# Patient Record
Sex: Male | Born: 1950
Health system: Southern US, Community
[De-identification: ages and names within clinical notes are randomized; demographics above are authoritative.]

## PROBLEM LIST (undated history)

## (undated) DIAGNOSIS — I714 Abdominal aortic aneurysm, without rupture, unspecified: Secondary | ICD-10-CM

## (undated) DIAGNOSIS — N3289 Other specified disorders of bladder: Secondary | ICD-10-CM

## (undated) DIAGNOSIS — J449 Chronic obstructive pulmonary disease, unspecified: Secondary | ICD-10-CM

## (undated) DIAGNOSIS — K219 Gastro-esophageal reflux disease without esophagitis: Secondary | ICD-10-CM

## (undated) DIAGNOSIS — R918 Other nonspecific abnormal finding of lung field: Secondary | ICD-10-CM

## (undated) DIAGNOSIS — E119 Type 2 diabetes mellitus without complications: Secondary | ICD-10-CM

## (undated) DIAGNOSIS — M1990 Unspecified osteoarthritis, unspecified site: Secondary | ICD-10-CM

## (undated) DIAGNOSIS — F419 Anxiety disorder, unspecified: Secondary | ICD-10-CM

## (undated) DIAGNOSIS — R3915 Urgency of urination: Secondary | ICD-10-CM

## (undated) DIAGNOSIS — D649 Anemia, unspecified: Secondary | ICD-10-CM

## (undated) HISTORY — PX: KNEE SURGERY: SHX244

## (undated) HISTORY — DX: Type 2 diabetes mellitus without complications: E11.9

## (undated) HISTORY — DX: Unspecified osteoarthritis, unspecified site: M19.90

## (undated) HISTORY — PX: CHOLECYSTECTOMY: SHX55

## (undated) HISTORY — PX: KNEE ARTHROSCOPY W/ MENISCAL REPAIR: SHX1877

## (undated) HISTORY — PX: KNEE ARTHROPLASTY: SHX992

---

## 1985-05-10 HISTORY — PX: CHOLECYSTECTOMY OPEN: SUR202

## 2000-08-22 ENCOUNTER — Encounter: Payer: Self-pay | Admitting: Emergency Medicine

## 2000-08-22 ENCOUNTER — Emergency Department (HOSPITAL_COMMUNITY): Admission: EM | Admit: 2000-08-22 | Discharge: 2000-08-22 | Payer: Self-pay | Admitting: Emergency Medicine

## 2001-12-04 ENCOUNTER — Ambulatory Visit (HOSPITAL_COMMUNITY): Admission: RE | Admit: 2001-12-04 | Discharge: 2001-12-04 | Payer: Self-pay | Admitting: *Deleted

## 2005-05-10 HISTORY — PX: TRIGGER FINGER RELEASE: SHX641

## 2006-05-10 HISTORY — PX: ELBOW SURGERY: SHX618

## 2011-04-15 ENCOUNTER — Ambulatory Visit
Admission: RE | Admit: 2011-04-15 | Discharge: 2011-04-15 | Disposition: A | Discharge status: Home / Self Care | Payer: BC Managed Care – PPO | Source: Ambulatory Visit | Attending: Family Medicine | Admitting: Family Medicine

## 2011-04-15 ENCOUNTER — Other Ambulatory Visit: Payer: Self-pay | Admitting: Family Medicine

## 2011-04-15 DIAGNOSIS — R109 Unspecified abdominal pain: Secondary | ICD-10-CM

## 2011-04-15 DIAGNOSIS — N200 Calculus of kidney: Secondary | ICD-10-CM

## 2013-04-26 ENCOUNTER — Other Ambulatory Visit: Payer: Self-pay | Admitting: Family Medicine

## 2013-04-26 DIAGNOSIS — F17200 Nicotine dependence, unspecified, uncomplicated: Secondary | ICD-10-CM

## 2013-05-18 ENCOUNTER — Inpatient Hospital Stay: Admission: RE | Admit: 2013-05-18 | Payer: BC Managed Care – PPO | Source: Ambulatory Visit

## 2015-04-19 DIAGNOSIS — J209 Acute bronchitis, unspecified: Secondary | ICD-10-CM | POA: Diagnosis not present

## 2015-06-04 DIAGNOSIS — R413 Other amnesia: Secondary | ICD-10-CM | POA: Diagnosis not present

## 2015-06-04 DIAGNOSIS — F321 Major depressive disorder, single episode, moderate: Secondary | ICD-10-CM | POA: Diagnosis not present

## 2015-06-04 DIAGNOSIS — Z125 Encounter for screening for malignant neoplasm of prostate: Secondary | ICD-10-CM | POA: Diagnosis not present

## 2015-06-04 DIAGNOSIS — Z23 Encounter for immunization: Secondary | ICD-10-CM | POA: Diagnosis not present

## 2015-06-04 DIAGNOSIS — Z136 Encounter for screening for cardiovascular disorders: Secondary | ICD-10-CM | POA: Diagnosis not present

## 2015-06-04 DIAGNOSIS — R252 Cramp and spasm: Secondary | ICD-10-CM | POA: Diagnosis not present

## 2015-06-04 DIAGNOSIS — R0683 Snoring: Secondary | ICD-10-CM | POA: Diagnosis not present

## 2015-06-04 DIAGNOSIS — Z0000 Encounter for general adult medical examination without abnormal findings: Secondary | ICD-10-CM | POA: Diagnosis not present

## 2015-06-10 ENCOUNTER — Other Ambulatory Visit: Payer: Self-pay | Admitting: Family Medicine

## 2015-06-10 DIAGNOSIS — Z139 Encounter for screening, unspecified: Secondary | ICD-10-CM

## 2015-06-10 DIAGNOSIS — H2513 Age-related nuclear cataract, bilateral: Secondary | ICD-10-CM | POA: Diagnosis not present

## 2015-06-10 DIAGNOSIS — D3132 Benign neoplasm of left choroid: Secondary | ICD-10-CM | POA: Diagnosis not present

## 2015-06-19 ENCOUNTER — Ambulatory Visit
Admission: RE | Admit: 2015-06-19 | Discharge: 2015-06-19 | Disposition: A | Discharge status: Home / Self Care | Payer: Medicare Other | Source: Ambulatory Visit | Attending: Family Medicine | Admitting: Family Medicine

## 2015-06-19 DIAGNOSIS — Z139 Encounter for screening, unspecified: Secondary | ICD-10-CM

## 2015-06-19 DIAGNOSIS — I77811 Abdominal aortic ectasia: Secondary | ICD-10-CM | POA: Diagnosis not present

## 2015-06-19 DIAGNOSIS — Z136 Encounter for screening for cardiovascular disorders: Secondary | ICD-10-CM | POA: Diagnosis not present

## 2015-06-19 DIAGNOSIS — Z87891 Personal history of nicotine dependence: Secondary | ICD-10-CM | POA: Diagnosis not present

## 2015-06-26 DIAGNOSIS — G4710 Hypersomnia, unspecified: Secondary | ICD-10-CM | POA: Diagnosis not present

## 2015-06-30 DIAGNOSIS — F321 Major depressive disorder, single episode, moderate: Secondary | ICD-10-CM | POA: Diagnosis not present

## 2015-06-30 DIAGNOSIS — I719 Aortic aneurysm of unspecified site, without rupture: Secondary | ICD-10-CM | POA: Diagnosis not present

## 2015-06-30 DIAGNOSIS — I700 Atherosclerosis of aorta: Secondary | ICD-10-CM | POA: Diagnosis not present

## 2015-06-30 DIAGNOSIS — R413 Other amnesia: Secondary | ICD-10-CM | POA: Diagnosis not present

## 2015-06-30 DIAGNOSIS — R0609 Other forms of dyspnea: Secondary | ICD-10-CM | POA: Diagnosis not present

## 2015-07-01 ENCOUNTER — Other Ambulatory Visit: Payer: Self-pay | Admitting: Family Medicine

## 2015-07-01 DIAGNOSIS — R413 Other amnesia: Secondary | ICD-10-CM

## 2015-07-09 ENCOUNTER — Ambulatory Visit
Admission: RE | Admit: 2015-07-09 | Discharge: 2015-07-09 | Disposition: A | Discharge status: Home / Self Care | Payer: Medicare Other | Source: Ambulatory Visit | Attending: Family Medicine | Admitting: Family Medicine

## 2015-07-09 DIAGNOSIS — R413 Other amnesia: Secondary | ICD-10-CM | POA: Diagnosis not present

## 2015-07-21 ENCOUNTER — Other Ambulatory Visit: Payer: Self-pay | Admitting: Acute Care

## 2015-07-21 DIAGNOSIS — Z87891 Personal history of nicotine dependence: Secondary | ICD-10-CM

## 2015-07-29 ENCOUNTER — Encounter: Payer: Self-pay | Admitting: Acute Care

## 2015-07-29 ENCOUNTER — Ambulatory Visit (INDEPENDENT_AMBULATORY_CARE_PROVIDER_SITE_OTHER)
Admission: RE | Admit: 2015-07-29 | Discharge: 2015-07-29 | Disposition: A | Discharge status: Home / Self Care | Payer: Medicare Other | Source: Ambulatory Visit | Attending: Acute Care | Admitting: Acute Care

## 2015-07-29 ENCOUNTER — Ambulatory Visit (INDEPENDENT_AMBULATORY_CARE_PROVIDER_SITE_OTHER): Payer: Medicare Other | Admitting: Acute Care

## 2015-07-29 DIAGNOSIS — Z87891 Personal history of nicotine dependence: Secondary | ICD-10-CM | POA: Diagnosis not present

## 2015-07-29 NOTE — Progress Notes (Signed)
Shared Decision Making Visit Lung Cancer Screening Program [phone])   Eligibility:  Age 65 y.o.  Pack Years Smoking History Calculation 52.5 pack years (# packs/per year x # years smoked)  Recent History of coughing up blood  no  Unexplained weight loss? no ( >Than 15 pounds within the last 6 months )  Prior History Lung / other cancer no (Diagnosis within the last 5 years already requiring surveillance chest CT Scans).  Smoking Status Former Smoker  Former Smokers: Years since quit: < 1 year  Quit Date: 04/2015  Visit Components:  Discussion included one or more decision making aids. yes  Discussion included risk/benefits of screening. yes  Discussion included potential follow up diagnostic testing for abnormal scans. yes  Discussion included meaning and risk of over diagnosis. yes  Discussion included meaning and risk of False Positives. yes  Discussion included meaning of total radiation exposure. yes  Counseling Included:  Importance of adherence to annual lung cancer LDCT screening. yes  Impact of comorbidities on ability to participate in the program. yes  Ability and willingness to under diagnostic treatment. yes  Smoking Cessation Counseling:  Current Smokers:   Discussed importance of smoking cessation.NA: Former smoker  Information about tobacco cessation classes and interventions provided to patient. yes  Patient provided with "ticket" for LDCT Scan. yes  Symptomatic Patient. no  Counseling:NA  Diagnosis Code: Tobacco Use Z72.0  Asymptomatic Patient yes  Counseling NA: Former smoker  Former Smokers:   Discussed the importance of maintaining cigarette abstinence. yes  Diagnosis Code: Personal History of Nicotine Dependence. [id number]  Information about tobacco cessation classes and interventions provided to patient. Yes  Patient provided with "ticket" for LDCT Scan. yes  Written Order for Lung Cancer Screening with LDCT placed in Epic.  Yes (CT Chest Lung Cancer Screening Low Dose W/O CM) [id number] Z12.2-Screening of respiratory organs Z87.891-Personal history of nicotine dependence  I spent 15 minutes of face to face time with Mr. Burggraf discussing the risks and benefits of lung cancer screening. We viewed a power point together that explained in detail the above noted topics. We took the time to pause the power point at intervals to allow for questions to be asked and answered to ensure understanding. We discussed that he had taken the single most powerful action possible to decrease his risk of developing lung cancer when he quit smoking. I counseled Mr. Forseth to remain smoke free, and to contact me if he ever had the desire to smoke again so that I can provide resources and tools to help support the effort to remain smoke free. We discussed the time and location of the scan, and that either [place] or I will call with the results within  24-48 hours of receiving them. Mr. Lucatero has my card and contact information in the event he needs to speak with me, in addition to a copy of the power point we reviewed as a resource. Both he and his wife verbalized understanding of all of the above and had no further questions upon leaving the office.    Magdalen Spatz, NP 07/29/2015

## 2015-09-29 DIAGNOSIS — E538 Deficiency of other specified B group vitamins: Secondary | ICD-10-CM | POA: Diagnosis not present

## 2015-09-29 DIAGNOSIS — I719 Aortic aneurysm of unspecified site, without rupture: Secondary | ICD-10-CM | POA: Diagnosis not present

## 2015-09-29 DIAGNOSIS — Z23 Encounter for immunization: Secondary | ICD-10-CM | POA: Diagnosis not present

## 2015-09-29 DIAGNOSIS — F321 Major depressive disorder, single episode, moderate: Secondary | ICD-10-CM | POA: Diagnosis not present

## 2015-09-29 DIAGNOSIS — I700 Atherosclerosis of aorta: Secondary | ICD-10-CM | POA: Diagnosis not present

## 2015-10-09 DIAGNOSIS — M65332 Trigger finger, left middle finger: Secondary | ICD-10-CM | POA: Diagnosis not present

## 2015-10-23 DIAGNOSIS — M65332 Trigger finger, left middle finger: Secondary | ICD-10-CM | POA: Diagnosis not present

## 2016-03-16 ENCOUNTER — Telehealth: Payer: Self-pay | Admitting: Acute Care

## 2016-03-16 DIAGNOSIS — Z87891 Personal history of nicotine dependence: Secondary | ICD-10-CM

## 2016-03-16 NOTE — Telephone Encounter (Signed)
This is documentation of the phone call made on 08/01/2015 to Mr. Chad Flores with the results of his low-dose screening CT. I explained his scan was read as a lung RADS category 2, indicating nodules that are benign in both appearance and behavior. Recommendation per radiology is for continued annual screening with low-dose chest CT without contrast in 12 months. I indicated to Chad Flores that we would order and schedule the follow-up scan for March 2018. I also explained that the scan indicated some underlying emphysema, of which she was already aware . He verbalized understanding of the above and had no further questions regarding the results of his scan. He has my contact information in the event he has questions in the future.

## 2016-06-03 DIAGNOSIS — J069 Acute upper respiratory infection, unspecified: Secondary | ICD-10-CM | POA: Diagnosis not present

## 2016-06-14 DIAGNOSIS — I700 Atherosclerosis of aorta: Secondary | ICD-10-CM | POA: Diagnosis not present

## 2016-06-14 DIAGNOSIS — Z5181 Encounter for therapeutic drug level monitoring: Secondary | ICD-10-CM | POA: Diagnosis not present

## 2016-06-14 DIAGNOSIS — Z23 Encounter for immunization: Secondary | ICD-10-CM | POA: Diagnosis not present

## 2016-06-14 DIAGNOSIS — Z125 Encounter for screening for malignant neoplasm of prostate: Secondary | ICD-10-CM | POA: Diagnosis not present

## 2016-06-14 DIAGNOSIS — F321 Major depressive disorder, single episode, moderate: Secondary | ICD-10-CM | POA: Diagnosis not present

## 2016-06-14 DIAGNOSIS — Z0000 Encounter for general adult medical examination without abnormal findings: Secondary | ICD-10-CM | POA: Diagnosis not present

## 2016-06-14 DIAGNOSIS — I719 Aortic aneurysm of unspecified site, without rupture: Secondary | ICD-10-CM | POA: Diagnosis not present

## 2016-07-21 DIAGNOSIS — H2513 Age-related nuclear cataract, bilateral: Secondary | ICD-10-CM | POA: Diagnosis not present

## 2016-07-21 DIAGNOSIS — D3132 Benign neoplasm of left choroid: Secondary | ICD-10-CM | POA: Diagnosis not present

## 2016-07-29 ENCOUNTER — Ambulatory Visit (INDEPENDENT_AMBULATORY_CARE_PROVIDER_SITE_OTHER)
Admission: RE | Admit: 2016-07-29 | Discharge: 2016-07-29 | Disposition: A | Discharge status: Home / Self Care | Payer: Medicare Other | Source: Ambulatory Visit | Attending: Acute Care | Admitting: Acute Care

## 2016-07-29 DIAGNOSIS — Z87891 Personal history of nicotine dependence: Secondary | ICD-10-CM | POA: Diagnosis not present

## 2016-08-04 ENCOUNTER — Other Ambulatory Visit: Payer: Self-pay | Admitting: Acute Care

## 2016-08-04 DIAGNOSIS — I700 Atherosclerosis of aorta: Secondary | ICD-10-CM | POA: Diagnosis not present

## 2016-08-04 DIAGNOSIS — I719 Aortic aneurysm of unspecified site, without rupture: Secondary | ICD-10-CM | POA: Diagnosis not present

## 2016-08-04 DIAGNOSIS — J439 Emphysema, unspecified: Secondary | ICD-10-CM | POA: Diagnosis not present

## 2016-08-04 DIAGNOSIS — Z87891 Personal history of nicotine dependence: Secondary | ICD-10-CM

## 2016-10-07 DIAGNOSIS — L989 Disorder of the skin and subcutaneous tissue, unspecified: Secondary | ICD-10-CM | POA: Diagnosis not present

## 2016-10-07 DIAGNOSIS — W57XXXA Bitten or stung by nonvenomous insect and other nonvenomous arthropods, initial encounter: Secondary | ICD-10-CM | POA: Diagnosis not present

## 2016-10-07 DIAGNOSIS — J439 Emphysema, unspecified: Secondary | ICD-10-CM | POA: Diagnosis not present

## 2016-11-09 IMAGING — MR MR HEAD W/O CM
10 series · 48 of 48 positions shown · non-contrast
Comparison: None.

CLINICAL DATA: Memory loss over the last 6-8 months.

EXAM:
MRI HEAD WITHOUT CONTRAST
TECHNIQUE: Multiplanar, multiecho pulse sequences of the brain and surrounding
structures were obtained without intravenous contrast.

[Series 2: t1_se_sag · sagittal · 5.0mm · 0.45mm/px · 3 of 21 slices shown]
[im 1/21]
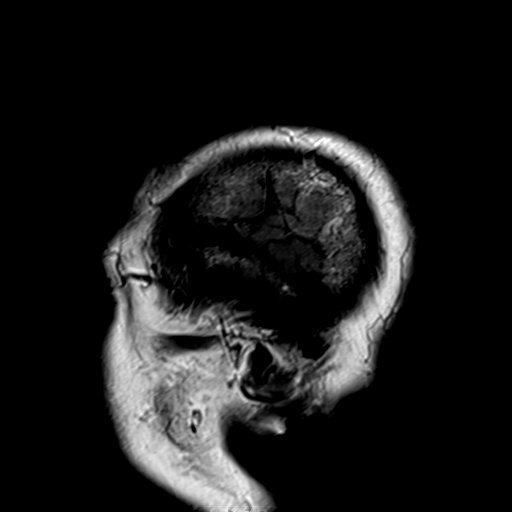
[im 11/21]
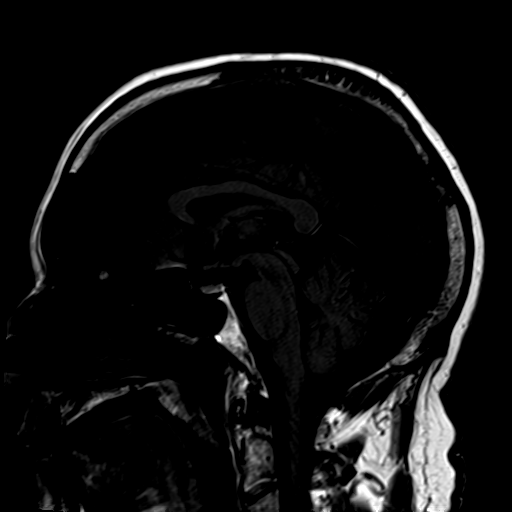
[im 21/21]
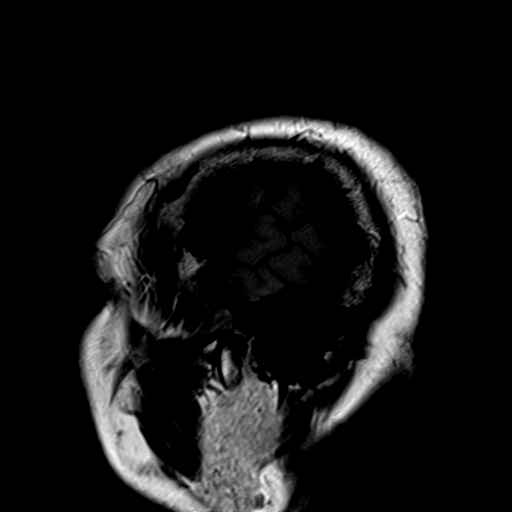

[Series 3: ep2d_diff_(id)_trace · axial · 3.0mm · 1.80mm/px · z∈[-42,+105]mm · 10 of 99 slices shown]
[im 1/99]
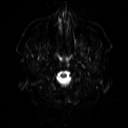
[im 11/99]
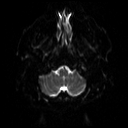
[im 22/99]
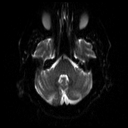
[im 33/99]
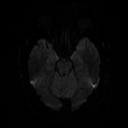
[im 44/99]
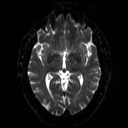
[im 55/99]
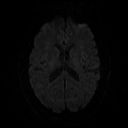
[im 66/99]
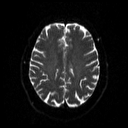
[im 77/99]
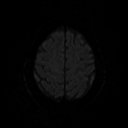
[im 88/99]
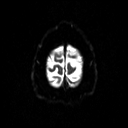
[im 99/99]
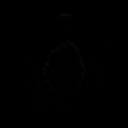

[Series 4: ep2d_diff_(id)_trace_adc · axial · 3.0mm · 1.80mm/px · z∈[-42,+105]mm · 5 of 49 slices shown]
[im 1/49]
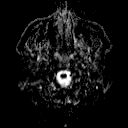
[im 13/49]
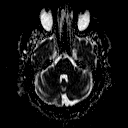
[im 25/49]
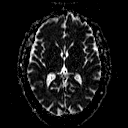
[im 37/49]
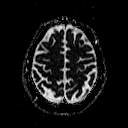
[im 49/49]
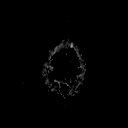

[Series 5: ep2d_diff_cor · coronal · 5.0mm · 1.77mm/px · 5 of 48 slices shown]
[im 1/48]
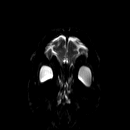
[im 12/48]
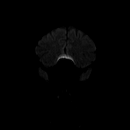
[im 24/48]
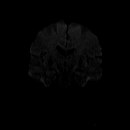
[im 36/48]
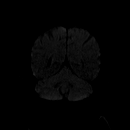
[im 48/48]
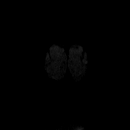

[Series 6: ep2d_diff_cor_adc · coronal · 5.0mm · 1.77mm/px · 2 of 24 slices shown]
[im 1/24]
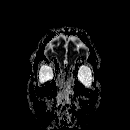
[im 24/24]
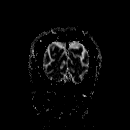

[Series 8: swi_images · axial · 2.0mm · 0.90mm/px · z∈[-51,+107]mm · 8 of 80 slices shown]
[im 1/80]
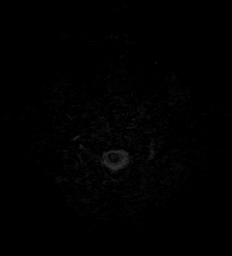
[im 12/80]
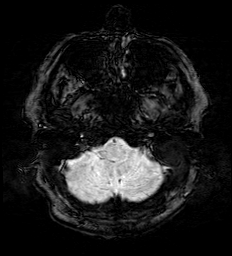
[im 23/80]
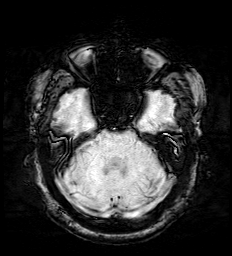
[im 34/80]
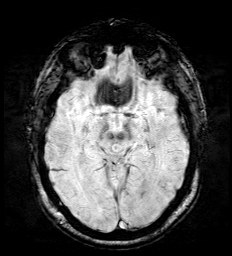
[im 46/80]
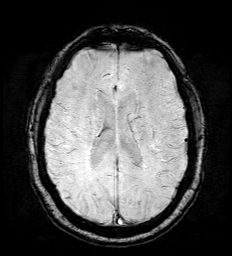
[im 57/80]
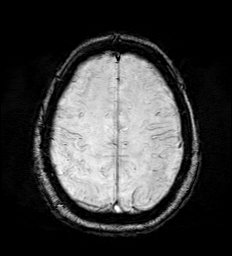
[im 68/80]
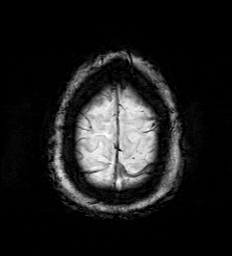
[im 80/80]
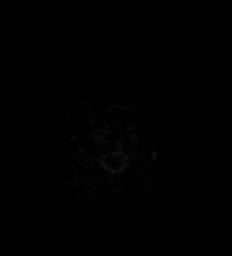

[Series 9: FLAIR · axial · 5.0mm · 0.45mm/px · z∈[-44,+100]mm · 2 of 24 slices shown]
[im 1/24]
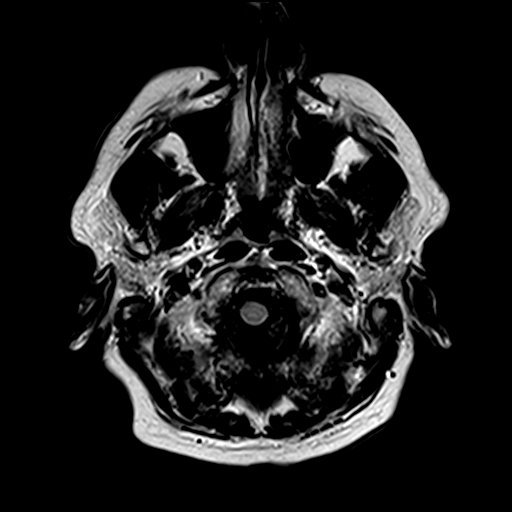
[im 24/24]
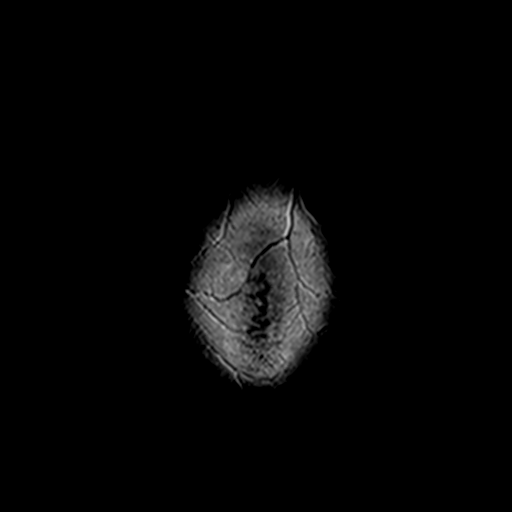

[Series 10: t2_tse_tra_512 · axial · 5.0mm · 0.60mm/px · z∈[-45,+99]mm · 2 of 24 slices shown]
[im 1/24]
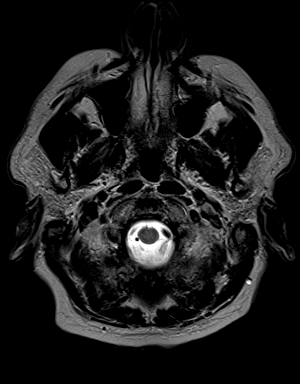
[im 24/24]
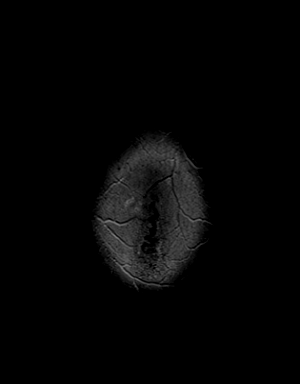

[Series 11: t1_mpr_tra · axial · 2.0mm · 0.45mm/px · z∈[-52,+106]mm · 8 of 80 slices shown]
[im 1/80]
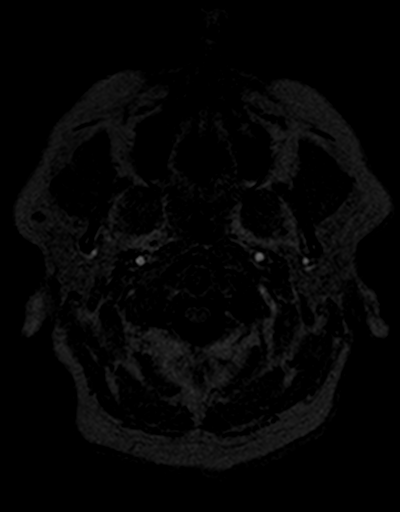
[im 12/80]
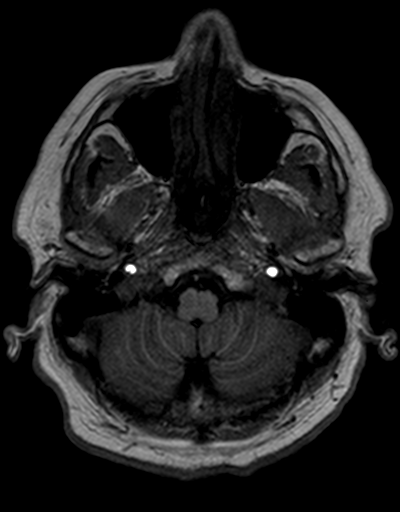
[im 23/80]
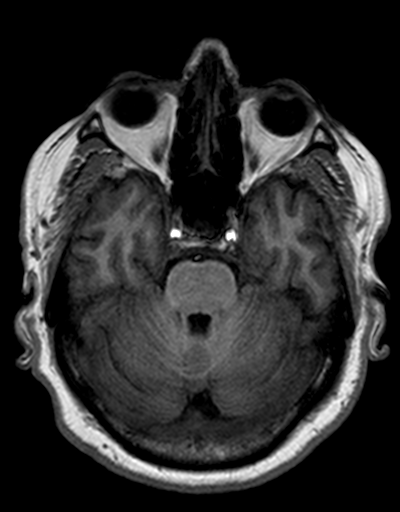
[im 34/80]
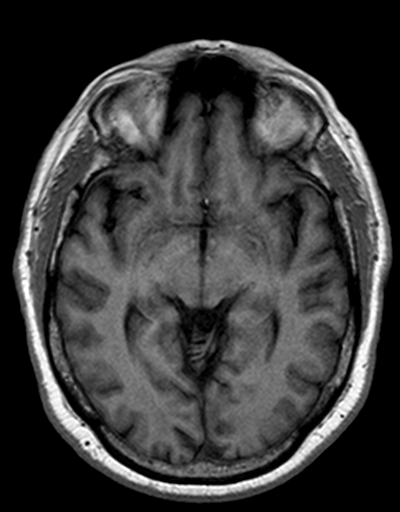
[im 46/80]
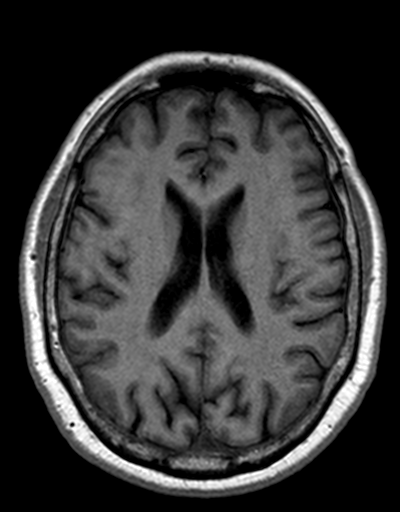
[im 57/80]
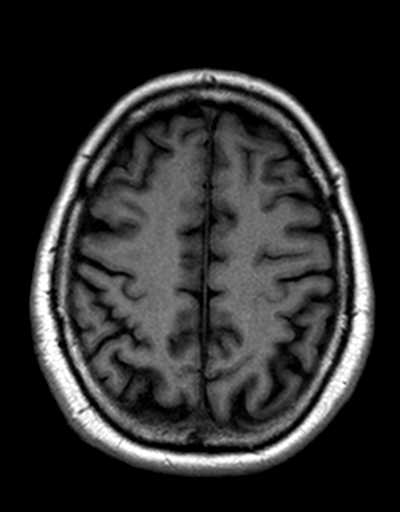
[im 68/80]
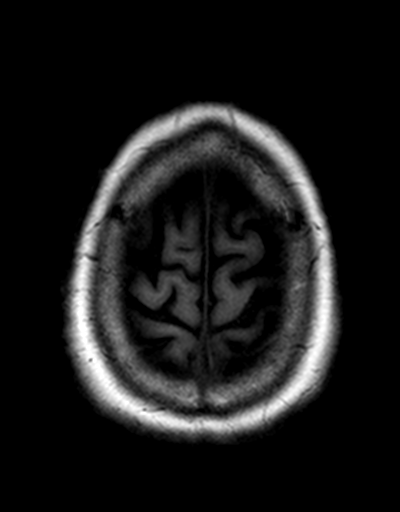
[im 80/80]
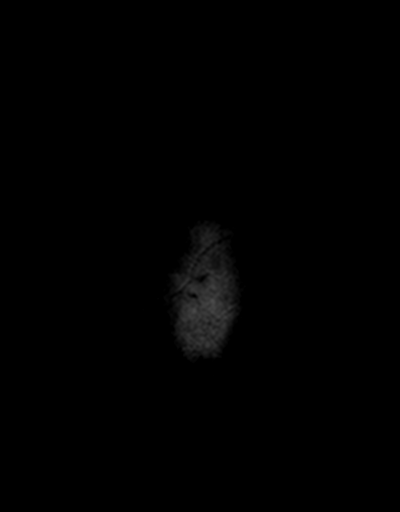

[Series 12: T2 · coronal · 5.0mm · 0.45mm/px · 3 of 25 slices shown]
[im 1/25]
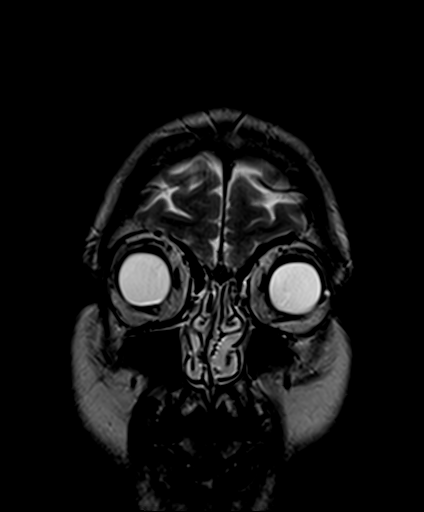
[im 13/25]
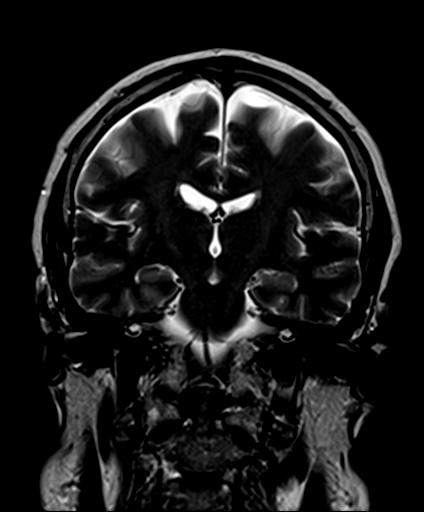
[im 25/25]
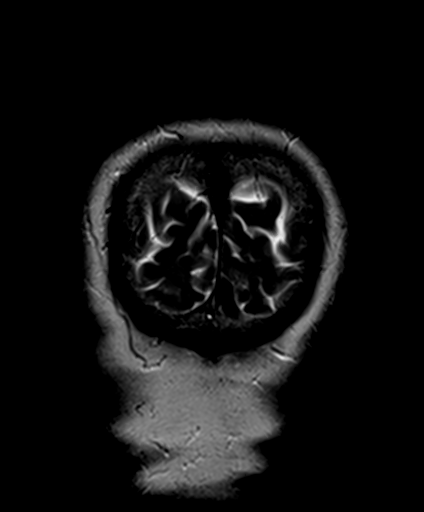

[48 of 48 positions shown; findings below may reference images not displayed]

FINDINGS: No acute infarct, hemorrhage, or mass lesion is present. The
ventricles are of normal size. No significant extraaxial fluid
collection is present.

Minimal periventricular T2 changes within normal limits for age.
There is no significant atrophy.

The internal auditory canals are within normal limits bilaterally.
Flow is present in the major intracranial arteries. The globes and
orbits are intact.

The paranasal sinuses and mastoid air cells are clear.

Skullbase is within normal limits. Midline sagittal images are
unremarkable.
IMPRESSION: 1. Normal MRI appearance of the brain for age.

## 2016-11-29 DIAGNOSIS — L280 Lichen simplex chronicus: Secondary | ICD-10-CM | POA: Diagnosis not present

## 2016-11-29 DIAGNOSIS — B079 Viral wart, unspecified: Secondary | ICD-10-CM | POA: Diagnosis not present

## 2016-11-29 DIAGNOSIS — L820 Inflamed seborrheic keratosis: Secondary | ICD-10-CM | POA: Diagnosis not present

## 2016-11-29 DIAGNOSIS — L814 Other melanin hyperpigmentation: Secondary | ICD-10-CM | POA: Diagnosis not present

## 2016-11-29 DIAGNOSIS — D485 Neoplasm of uncertain behavior of skin: Secondary | ICD-10-CM | POA: Diagnosis not present

## 2016-12-13 DIAGNOSIS — F321 Major depressive disorder, single episode, moderate: Secondary | ICD-10-CM | POA: Diagnosis not present

## 2016-12-13 DIAGNOSIS — I719 Aortic aneurysm of unspecified site, without rupture: Secondary | ICD-10-CM | POA: Diagnosis not present

## 2016-12-13 DIAGNOSIS — J439 Emphysema, unspecified: Secondary | ICD-10-CM | POA: Diagnosis not present

## 2016-12-13 DIAGNOSIS — I700 Atherosclerosis of aorta: Secondary | ICD-10-CM | POA: Diagnosis not present

## 2016-12-13 DIAGNOSIS — R7301 Impaired fasting glucose: Secondary | ICD-10-CM | POA: Diagnosis not present

## 2017-01-04 DIAGNOSIS — I959 Hypotension, unspecified: Secondary | ICD-10-CM | POA: Diagnosis not present

## 2017-01-04 DIAGNOSIS — R51 Headache: Secondary | ICD-10-CM | POA: Diagnosis not present

## 2017-01-04 DIAGNOSIS — R42 Dizziness and giddiness: Secondary | ICD-10-CM | POA: Diagnosis not present

## 2017-01-29 DIAGNOSIS — S335XXA Sprain of ligaments of lumbar spine, initial encounter: Secondary | ICD-10-CM | POA: Diagnosis not present

## 2017-01-31 ENCOUNTER — Ambulatory Visit (INDEPENDENT_AMBULATORY_CARE_PROVIDER_SITE_OTHER): Payer: Medicare Other | Admitting: Orthopaedic Surgery

## 2017-01-31 ENCOUNTER — Ambulatory Visit (INDEPENDENT_AMBULATORY_CARE_PROVIDER_SITE_OTHER): Payer: Medicare Other

## 2017-01-31 ENCOUNTER — Encounter (INDEPENDENT_AMBULATORY_CARE_PROVIDER_SITE_OTHER): Payer: Self-pay | Admitting: Orthopaedic Surgery

## 2017-01-31 ENCOUNTER — Encounter (INDEPENDENT_AMBULATORY_CARE_PROVIDER_SITE_OTHER): Payer: Self-pay

## 2017-01-31 VITALS — BP 99/59 | HR 92 | Resp 16 | Ht 70.5 in | Wt 185.0 lb

## 2017-01-31 DIAGNOSIS — M25562 Pain in left knee: Secondary | ICD-10-CM | POA: Diagnosis not present

## 2017-01-31 DIAGNOSIS — G8929 Other chronic pain: Secondary | ICD-10-CM

## 2017-01-31 NOTE — Progress Notes (Signed)
Office Visit Note   Patient: Chad Flores           Date of Birth: 1951-03-08           MRN: [id number] Visit Date: 01/31/2017              Requested by: Darcus Austin, MD [address] [place], [place] [address] PCP: Darcus Austin, MD   Assessment & Plan: Visit Diagnoses:  1. Chronic pain of left knee   end-stage osteoarthritis left knee  Plan: long discussion regarding his end-stage osteoarthritis. Mr. Beckford would like to proceedwith a left total knee replacement. I discussed this in detail including hospitalization, incision surgery rehabilitation and what he can expect postoperatively. He'll need clearance from Dr. Darcus Austin.  Follow-Up Instructions: No Follow-up on file.   Orders:  Orders Placed This Encounter  Procedures  . XR KNEE 3 VIEW LEFT   No orders of the defined types were placed in this encounter.     Procedures: No procedures performed   Clinical Data: No additional findings.   Subjective: Chief Complaint  Patient presents with  . Left Knee - Pain, Edema, Numbness    Mr. Rubey is a 66 y o that presents with chronic Left knee pain x years. Hx of 5 Left knee surgeries.   . Lower Back - Pain  Mr. Tietze has a long history of left knee problems. He's had at least 2 knee arthroscopies over many years. He's has evidence of osteoarthritis by previous films. He's had cortisone and even over-the-counter anti-inflammatory medicines. He has now reached the point where he is having  more pain to the point of compromise .he would like to discuss total knee replacement .he's not had any recent injury or trauma. Denies fever or chills. He does have chronic back pain and is being followed by Dr. Carloyn Manner.Not experiencing any numbness or tingling in his feet   HPI  Review of Systems  Constitutional: Negative for fatigue.  HENT: Negative for hearing loss.   Respiratory: Negative for apnea, chest tightness and shortness of breath.     Cardiovascular: Negative for chest pain, palpitations and leg swelling.  Gastrointestinal: Negative for blood in stool, constipation and diarrhea.  Genitourinary: Negative for difficulty urinating.  Musculoskeletal: Positive for back pain and joint swelling. Negative for arthralgias, myalgias, neck pain and neck stiffness.  Neurological: Positive for light-headedness. Negative for weakness, numbness and headaches.  Hematological: Does not bruise/bleed easily.  Psychiatric/Behavioral: Positive for sleep disturbance. The patient is not nervous/anxious.      Objective: Vital Signs: BP (!) 99/59   Pulse 92   Resp 16   Ht 5' 10.5" (1.791 m)   Wt 185 lb (83.9 kg)   BMI 26.17 kg/m   Physical Exam  Ortho Examleft knee with multiple old incisions. He had a prior open meniscectomy procedure back in the 1970s. Full extension and approximately 110 of flexion. No instability. No popliteal pain. No calf pain. Neurovascular exam appears to be intact. Does have a limp with when he weight bears weight. Straight leg raise negative bilaterally.Both hipswithout pain with range of motion. Skin intact except for the old incisions which have healed nicely. Awake alert and oriented 3. Comfortable sitting. Denies shortness of breath or chest pain. No distal edema.  Specialty Comments:  No specialty comments available.  Imaging: Xr Knee 3 View Left  Result Date: 01/31/2017 ilms of the left knee were obtained in 3 projections standing. There is significant decrease both  the medial lateral joint space with irregular joint surfaces, subchondral sclerosis and peripheral osteophytes. Alignment looks like it's reasonably normal. No ectopic calcification.Considerable osteophyte formation about the patellofemoral joint. Findings are consistent with end-stage osteoarthritisleft knee    PMFS History: There are no active problems to display for this patient.  Past Medical History:  Diagnosis Date  .  Osteoarthritis     Family History  Problem Relation Age of Onset  . Alzheimer's disease Mother   . Cancer Father     Past Surgical History:  Procedure Laterality Date  . CHOLECYSTECTOMY    . ELBOW SURGERY    . KNEE SURGERY    . TRIGGER FINGER RELEASE     Social History   Occupational History  . Not on file.   Social History Main Topics  . Smoking status: Current Every Day Smoker    Packs/day: 1.00    Years: 52.50    Types: Cigarettes  . Smokeless tobacco: Never Used     Comment: Now uses e cigarettes. Encouraged to quit smoking completely.  . Alcohol use 0.6 oz/week    1 Standard drinks or equivalent per week  . Drug use: No  . Sexual activity: Not on file

## 2017-02-02 DIAGNOSIS — J439 Emphysema, unspecified: Secondary | ICD-10-CM | POA: Diagnosis not present

## 2017-02-02 DIAGNOSIS — Z720 Tobacco use: Secondary | ICD-10-CM | POA: Diagnosis not present

## 2017-02-02 DIAGNOSIS — Z23 Encounter for immunization: Secondary | ICD-10-CM | POA: Diagnosis not present

## 2017-02-02 DIAGNOSIS — R7303 Prediabetes: Secondary | ICD-10-CM | POA: Diagnosis not present

## 2017-02-04 ENCOUNTER — Telehealth (INDEPENDENT_AMBULATORY_CARE_PROVIDER_SITE_OTHER): Payer: Self-pay | Admitting: Orthopaedic Surgery

## 2017-02-04 NOTE — Telephone Encounter (Signed)
LVM with pt to please call to schedule surgery. Will try pt again at a later time. 

## 2017-02-07 ENCOUNTER — Ambulatory Visit (INDEPENDENT_AMBULATORY_CARE_PROVIDER_SITE_OTHER): Payer: Self-pay | Admitting: Orthopaedic Surgery

## 2017-03-09 ENCOUNTER — Encounter (INDEPENDENT_AMBULATORY_CARE_PROVIDER_SITE_OTHER): Payer: Self-pay | Admitting: Orthopedic Surgery

## 2017-03-09 ENCOUNTER — Ambulatory Visit (INDEPENDENT_AMBULATORY_CARE_PROVIDER_SITE_OTHER): Payer: Medicare Other | Admitting: Orthopedic Surgery

## 2017-03-09 VITALS — BP 92/58 | HR 100 | Temp 97.2°F | Resp 17 | Ht 70.0 in | Wt 205.0 lb

## 2017-03-09 DIAGNOSIS — M1712 Unilateral primary osteoarthritis, left knee: Secondary | ICD-10-CM | POA: Diagnosis not present

## 2017-03-09 NOTE — H&P (Addendum)
Chad Fears, MD   Chad Borg, PA-C [address] [place], [place]  [address]                             [phone]   ORTHOPAEDIC HISTORY & PHYSICAL  Chad Flores MRN:  [id number] DOB/SEX:  1951-03-23/male  CHIEF COMPLAINT:  Painful left Knee  HISTORY: Patient is a 66 y.o. male presented with a history of pain in the left knee for 40 years. Onset of symptoms was gradual starting 40 years ago with gradually worsening course since that time. Prior procedures on the knee are meniscectomy x 3. Patient has been treated conservatively with over-the-counter NSAIDs and activity modification. Patient currently rates pain in the knee at 9 out of 10 with activity. There is pain at night. Present.  Chad Flores has a long history of left knee problems. He's had at least 2 knee arthroscopies over many years. He's has evidence of osteoarthritis by previous films. He's had cortisone and even over-the-counter anti-inflammatory medicines. He has now reached the point where he is having  more pain to the point of compromise .  They have been previously treated with: NSAIDS: ASA, Motrin, NSAID with mild improvement  Knee injection with corticosteroid  was performed Knee injection with visco supplementation was not performed Medications: Narcotics, NSAID, Tylenol with mild improvement  PAST MEDICAL HISTORY: There are no active problems to display for this patient.  Past Medical History:  Diagnosis Date  . Anemia   . Anxiety   . COPD (chronic obstructive pulmonary disease) ([facility])   . Osteoarthritis    Past Surgical History:  Procedure Laterality Date  . CHOLECYSTECTOMY    . ELBOW SURGERY    . KNEE ARTHROPLASTY    . KNEE SURGERY    . TRIGGER FINGER RELEASE       MEDICATIONS PRIOR TO ADMISSION:  Current Facility-Administered Medications:  .  [START ON 03/22/2017] acetaminophen (OFIRMEV) IV 1,000 mg, 1,000 mg, Intravenous, Once, Chad Balding, MD .  Chad Flores ON  03/22/2017] tranexamic acid (CYKLOKAPRON) 2,000 mg in sodium chloride 0.9 % 50 mL Topical Application, 9,518 mg, Topical, To OR, Chad Balding, MD  Current Outpatient Medications:  .  aspirin EC 81 MG tablet, Take 81 mg by mouth daily., Disp: , Rfl:  .  atorvastatin (LIPITOR) 10 MG tablet, Take 10 mg by mouth daily., Disp: , Rfl: 1 .  buPROPion (WELLBUTRIN XL) 150 MG 24 hr tablet, Take 150 mg by mouth in the morning, Disp: , Rfl: 1 .  diclofenac sodium (VOLTAREN) 1 % GEL, Apply 2 g topically 4 (four) times daily as needed (for pain). , Disp: , Rfl:  .  Fluticasone-Salmeterol (ADVAIR) 250-50 MCG/DOSE AEPB, Inhale 1 puff into the lungs 2 (two) times daily., Disp: , Rfl:  .  ibuprofen (ADVIL,MOTRIN) 200 MG tablet, Take 400 mg by mouth every 6 (six) hours as needed for headache or moderate pain., Disp: , Rfl:  .  vitamin B-12 (CYANOCOBALAMIN) 1000 MCG tablet, Take 1,000 mcg by mouth daily., Disp: , Rfl:    ALLERGIES:  No Known Allergies  REVIEW OF SYSTEMS:  Review of Systems  Constitutional: Negative.   HENT: Negative.   Eyes: Negative.   Respiratory: Positive for shortness of breath.        COPD   Cardiovascular: Negative.   Gastrointestinal:       HEMORRHOIDS   Genitourinary: Negative.   Musculoskeletal: Positive for back pain  and joint pain.  Skin: Negative.   Neurological: Negative.   Psychiatric/Behavioral: Negative.     FAMILY HISTORY:   Family History  Problem Relation Age of Onset  . Alzheimer's disease Mother   . Cancer Father     SOCIAL HISTORY:   Social History   Occupational History  . Not on file  Tobacco Use  . Smoking status: Current Every Day Smoker    Packs/day: 1.00    Years: 52.50    Pack years: 52.50    Types: Cigarettes  . Smokeless tobacco: Never Used  . Tobacco comment: Now uses e cigarettes. Encouraged to quit smoking completely.  Substance and Sexual Activity  . Alcohol use: Yes    Alcohol/week: 0.6 oz    Types: 1 Standard drinks or  equivalent per week    Comment: occ  . Drug use: No  . Sexual activity: Not on file     EXAMINATION:  Vital signs in last 24 hours: There were no vitals taken for this visit.  Physical Exam  Constitutional: He is oriented to person, place, and time. He appears well-developed and well-nourished.  HENT:  Head: Normocephalic and atraumatic.  Eyes: Pupils are equal, round, and reactive to light. Conjunctivae and EOM are normal.  Neck: Neck supple. No thyromegaly present.  Cardiovascular: Normal rate, regular rhythm, normal heart sounds and intact distal pulses.   No murmur heard. Pulmonary/Chest: Effort normal and breath sounds normal. He has no wheezes. He has no rales.  Abdominal: Soft. Bowel sounds are normal. There is no tenderness.  Neurological: He is alert and oriented to person, place, and time.  Skin: Skin is warm and dry.  Psychiatric: He has a normal mood and affect. His behavior is normal. Judgment and thought content normal.   Ortho Exam  Range of motion is from 5 to 105. Does have a trace effusion. Well-healed surgical incisions traverse the knee medially and laterally. Crepitance with range of motion. Antalgic limp noted   Imaging Review Plain radiographs demonstrate moderate degenerative joint disease of the left knee. The overall alignment is mild varus. The bone quality appears to be good for age and reported activity level.  ASSESSMENT: End stage arthritis, left knee  Past Medical History:  Diagnosis Date  . Anemia   . Anxiety   . COPD (chronic obstructive pulmonary disease) ([facility])   . Osteoarthritis     PLAN: Plan for left total knee replacement.  The patient history, physical examination and imaging studies are consistent with moderate degenerative joint disease of the left knee. The patient has failed conservative treatment.  The clearance notes were reviewed.  After discussion with the patient it was felt that Total Knee Replacement was indicated. The  procedure,  risks, and benefits of total knee arthroplasty were presented and reviewed. The risks including but not limited to aseptic loosening, infection, blood clots, vascular and nerve injury, stiffness, patella tracking problems and fracture complications among others were discussed. The patient acknowledged the explanation, agreed to proceed with total knee replacement.  Mike Craze [place], [place] [phone]  03/21/2017 2:19 PM

## 2017-03-09 NOTE — Progress Notes (Signed)
Chad Fears, MD   Biagio Borg, PA-C [address], [place], [place]  [address]                             [phone]   ORTHOPAEDIC HISTORY & PHYSICAL  DELVONTE BERENSON MRN:  [id number] DOB/SEX:  08/30/1950/male  CHIEF COMPLAINT:  Painful left Knee  HISTORY: Patient is a 66 y.o. male presented with a history of pain in the left knee for 40 years. Onset of symptoms was gradual starting 40 years ago with gradually worsening course since that time. Prior procedures on the knee are meniscectomy x 3. Patient has been treated conservatively with over-the-counter NSAIDs and activity modification. Patient currently rates pain in the knee at 9 out of 10 with activity. There is pain at night. Present.  Mr. Rijo has a long history of left knee problems. He's had at least 2 knee arthroscopies over many years. He's has evidence of osteoarthritis by previous films. He's had cortisone and even over-the-counter anti-inflammatory medicines. He has now reached the point where he is having  more pain to the point of compromise .  They have been previously treated with: NSAIDS: ASA, Motrin, NSAID with mild improvement  Knee injection with corticosteroid  was performed Knee injection with visco supplementation was not performed Medications: Narcotics, NSAID, Tylenol with mild improvement  PAST MEDICAL HISTORY: There are no active problems to display for this patient.  Past Medical History:  Diagnosis Date  . Osteoarthritis    Past Surgical History:  Procedure Laterality Date  . CHOLECYSTECTOMY    . ELBOW SURGERY    . KNEE ARTHROPLASTY    . KNEE SURGERY    . TRIGGER FINGER RELEASE       MEDICATIONS PRIOR TO ADMISSION: No current facility-administered medications for this encounter.   Current Outpatient Prescriptions:  .  aspirin EC 81 MG tablet, Take 81 mg by mouth daily., Disp: , Rfl:  .  atorvastatin (LIPITOR) 10 MG tablet, Take 10 mg by mouth daily., Disp: , Rfl: 1 .   buPROPion (WELLBUTRIN XL) 150 MG 24 hr tablet, TK 1 T PO QAM, Disp: , Rfl: 1 .  diclofenac sodium (VOLTAREN) 1 % GEL, Apply 2 g topically 4 (four) times daily., Disp: , Rfl:  .  fluticasone-salmeterol (ADVAIR HFA) 115-21 MCG/ACT inhaler, Inhale 2 puffs into the lungs 2 (two) times daily., Disp: , Rfl:  .  meloxicam (MOBIC) 15 MG tablet, TK 1 T PO QD WF, Disp: , Rfl: 0 .  vitamin B-12 (CYANOCOBALAMIN) 1000 MCG tablet, Take 1,000 mcg by mouth daily., Disp: , Rfl:    ALLERGIES:  No Known Allergies  REVIEW OF SYSTEMS:  Review of Systems  Constitutional: Negative.   HENT: Negative.   Eyes: Negative.   Respiratory: Positive for shortness of breath.        COPD   Cardiovascular: Negative.   Gastrointestinal:       HEMORRHOIDS   Genitourinary: Negative.   Musculoskeletal: Positive for back pain and joint pain.  Skin: Negative.   Neurological: Negative.   Psychiatric/Behavioral: Negative.     FAMILY HISTORY:   Family History  Problem Relation Age of Onset  . Alzheimer's disease Mother   . Cancer Father     SOCIAL HISTORY:   Social History   Occupational History  . Not on file.   Social History Main Topics  . Smoking status: Current Every Day Smoker    Packs/day:  1.00    Years: 52.50    Types: Cigarettes  . Smokeless tobacco: Never Used     Comment: Now uses e cigarettes. Encouraged to quit smoking completely.  . Alcohol use 0.6 oz/week    1 Standard drinks or equivalent per week  . Drug use: No  . Sexual activity: Not on file     EXAMINATION:  Vital signs in last 24 hours: There were no vitals taken for this visit.  Physical Exam  Constitutional: He is oriented to person, place, and time. He appears well-developed and well-nourished.  HENT:  Head: Normocephalic and atraumatic.  Eyes: Pupils are equal, round, and reactive to light. Conjunctivae and EOM are normal.  Neck: Neck supple. No thyromegaly present.  Cardiovascular: Normal rate, regular rhythm, normal  heart sounds and intact distal pulses.   No murmur heard. Pulmonary/Chest: Effort normal and breath sounds normal. He has no wheezes. He has no rales.  Abdominal: Soft. Bowel sounds are normal. There is no tenderness.  Neurological: He is alert and oriented to person, place, and time.  Skin: Skin is warm and dry.  Psychiatric: He has a normal mood and affect. His behavior is normal. Judgment and thought content normal.   Ortho Exam  Range of motion is from 5 to 105. Does have a trace effusion. Well-healed surgical incisions traverse the knee medially and laterally. Crepitance with range of motion. Antalgic limp noted   Imaging Review Plain radiographs demonstrate moderate degenerative joint disease of the left knee. The overall alignment is mild varus. The bone quality appears to be good for age and reported activity level.  ASSESSMENT: End stage arthritis, left knee  Past Medical History:  Diagnosis Date  . Osteoarthritis     PLAN: Plan for left total knee replacement.  The patient history, physical examination and imaging studies are consistent with moderate degenerative joint disease of the left knee. The patient has failed conservative treatment.  The clearance notes were reviewed.  After discussion with the patient it was felt that Total Knee Replacement was indicated. The procedure,  risks, and benefits of total knee arthroplasty were presented and reviewed. The risks including but not limited to aseptic loosening, infection, blood clots, vascular and nerve injury, stiffness, patella tracking problems and fracture complications among others were discussed. The patient acknowledged the explanation, agreed to proceed with total knee replacement.  Mike Craze [place], [place] [phone]  03/09/2017 1:23 PM

## 2017-03-11 NOTE — Pre-Procedure Instructions (Signed)
[place]  03/11/2017      [facility] - [place], [place] - [name] [place] [address] [name] [name] [place] [address] Phone: [phone] Fax: [phone]    Your procedure is scheduled on November 13  Report to [place] at [place].M.  Call this number if you have problems the morning of surgery:  [phone]   Remember:  Do not eat food or drink liquids after midnight.  Continue all other medications as directed by your physician except follow these medication instructions before surgery   Take these medicines the morning of surgery with A SIP OF WATER  buPROPion (WELLBUTRIN XL)  Fluticasone-Salmeterol (ADVAIR)  7 days prior to surgery STOP taking any Aspirin (unless otherwise instructed by your surgeon), Aleve, Naproxen, Ibuprofen, Motrin, Advil, Goody's, BC's, all herbal medications, fish oil, and all vitamins    Do not wear jewelry  Do not wear lotions, powders, or cologne, or deoderant.  Men may shave face and neck.  Do not bring valuables to the hospital.  [facility] is not responsible for any belongings or valuables.  Contacts, dentures or bridgework may not be worn into surgery.  Leave your suitcase in the car.  After surgery it may be brought to your room.  For patients admitted to the hospital, discharge time will be determined by your treatment team.  Patients discharged the day of surgery will not be allowed to drive home.    Special instructions:   [place]- Preparing For Surgery  Before surgery, you can play an important role. Because skin is not sterile, your skin needs to be as free of germs as possible. You can reduce the number of germs on your skin by washing with CHG (chlorahexidine gluconate) Soap before surgery.  CHG is an antiseptic cleaner which kills germs and bonds with the skin to continue killing germs even after washing.  Please do not use if you have  an allergy to CHG or antibacterial soaps. If your skin becomes reddened/irritated stop using the CHG.  Do not shave (including legs and underarms) for at least 48 hours prior to first CHG shower. It is OK to shave your face.  Please follow these instructions carefully.   1. Shower the NIGHT BEFORE SURGERY and the MORNING OF SURGERY with CHG.   2. If you chose to wash your hair, wash your hair first as usual with your normal shampoo.  3. After you shampoo, rinse your hair and body thoroughly to remove the shampoo.  4. Use CHG as you would any other liquid soap. You can apply CHG directly to the skin and wash gently with a scrungie or a clean washcloth.   5. Apply the CHG Soap to your body ONLY FROM THE NECK DOWN.  Do not use on open wounds or open sores. Avoid contact with your eyes, ears, mouth and genitals (private parts). Wash Face and genitals (private parts)  with your normal soap.  6. Wash thoroughly, paying special attention to the area where your surgery will be performed.  7. Thoroughly rinse your body with warm water from the neck down.  8. DO NOT shower/wash with your normal soap after using and rinsing off the CHG Soap.  9. Pat yourself dry with a CLEAN TOWEL.  10. Wear CLEAN PAJAMAS to bed the night before surgery, wear comfortable clothes the morning of surgery  11. Place CLEAN SHEETS  on your bed the night of your first shower and DO NOT SLEEP WITH PETS.    Day of Surgery: Do not apply any deodorants/lotions. Please wear clean clothes to the hospital/surgery center.      Please read over the following fact sheets that you were given.

## 2017-03-14 ENCOUNTER — Encounter (HOSPITAL_COMMUNITY): Payer: Self-pay

## 2017-03-14 ENCOUNTER — Encounter (HOSPITAL_COMMUNITY)
Admission: RE | Admit: 2017-03-14 | Discharge: 2017-03-14 | Disposition: A | Discharge status: Home / Self Care | Payer: Medicare Other | Source: Ambulatory Visit | Attending: Orthopaedic Surgery | Admitting: Orthopaedic Surgery

## 2017-03-14 ENCOUNTER — Encounter (HOSPITAL_COMMUNITY)
Admission: RE | Admit: 2017-03-14 | Discharge: 2017-03-14 | Disposition: A | Discharge status: Home / Self Care | Payer: Medicare Other | Source: Ambulatory Visit | Attending: Orthopedic Surgery | Admitting: Orthopedic Surgery

## 2017-03-14 DIAGNOSIS — R0602 Shortness of breath: Secondary | ICD-10-CM | POA: Diagnosis not present

## 2017-03-14 DIAGNOSIS — Z01818 Encounter for other preprocedural examination: Secondary | ICD-10-CM

## 2017-03-14 DIAGNOSIS — R918 Other nonspecific abnormal finding of lung field: Secondary | ICD-10-CM | POA: Diagnosis not present

## 2017-03-14 HISTORY — DX: Chronic obstructive pulmonary disease, unspecified: J44.9

## 2017-03-14 HISTORY — DX: Anemia, unspecified: D64.9

## 2017-03-14 HISTORY — DX: Anxiety disorder, unspecified: F41.9

## 2017-03-14 LAB — CBC WITH DIFFERENTIAL/PLATELET
BASOS ABS: 0 10*3/uL (ref 0.0–0.1)
BASOS PCT: 0 %
EOS ABS: 0.2 10*3/uL (ref 0.0–0.7)
EOS PCT: 3 %
HCT: 48.4 % (ref 39.0–52.0)
Hemoglobin: 16.6 g/dL (ref 13.0–17.0)
Lymphocytes Relative: 38 %
Lymphs Abs: 2.6 10*3/uL (ref 0.7–4.0)
MCH: 31.3 pg (ref 26.0–34.0)
MCHC: 34.3 g/dL (ref 30.0–36.0)
MCV: 91.3 fL (ref 78.0–100.0)
MONO ABS: 0.5 10*3/uL (ref 0.1–1.0)
MONOS PCT: 7 %
Neutro Abs: 3.4 10*3/uL (ref 1.7–7.7)
Neutrophils Relative %: 52 %
PLATELETS: 176 10*3/uL (ref 150–400)
RBC: 5.3 MIL/uL (ref 4.22–5.81)
RDW: 12.8 % (ref 11.5–15.5)
WBC: 6.7 10*3/uL (ref 4.0–10.5)

## 2017-03-14 LAB — SURGICAL PCR SCREEN
MRSA, PCR: POSITIVE — AB
Staphylococcus aureus: POSITIVE — AB

## 2017-03-14 LAB — URINALYSIS, ROUTINE W REFLEX MICROSCOPIC
BILIRUBIN URINE: NEGATIVE
Glucose, UA: NEGATIVE mg/dL
HGB URINE DIPSTICK: NEGATIVE
KETONES UR: NEGATIVE mg/dL
Leukocytes, UA: NEGATIVE
NITRITE: NEGATIVE
PH: 5 (ref 5.0–8.0)
Protein, ur: NEGATIVE mg/dL
SPECIFIC GRAVITY, URINE: 1.021 (ref 1.005–1.030)

## 2017-03-14 LAB — PROTIME-INR
INR: 0.92
PROTHROMBIN TIME: 12.3 s (ref 11.4–15.2)

## 2017-03-14 LAB — APTT: APTT: 27 s (ref 24–36)

## 2017-03-14 LAB — ABO/RH: ABO/RH(D): O POS

## 2017-03-14 LAB — TYPE AND SCREEN
ABO/RH(D): O POS
ANTIBODY SCREEN: NEGATIVE

## 2017-03-14 LAB — COMPREHENSIVE METABOLIC PANEL
ALBUMIN: 4 g/dL (ref 3.5–5.0)
ALT: 29 U/L (ref 17–63)
ANION GAP: 9 (ref 5–15)
AST: 25 U/L (ref 15–41)
Alkaline Phosphatase: 113 U/L (ref 38–126)
BILIRUBIN TOTAL: 1.1 mg/dL (ref 0.3–1.2)
BUN: 16 mg/dL (ref 6–20)
CHLORIDE: 105 mmol/L (ref 101–111)
CO2: 25 mmol/L (ref 22–32)
Calcium: 9.4 mg/dL (ref 8.9–10.3)
Creatinine, Ser: 1.27 mg/dL — ABNORMAL HIGH (ref 0.61–1.24)
GFR calc Af Amer: >60 mL/min (ref >60)
GFR, EST NON AFRICAN AMERICAN: 57 mL/min — AB (ref >60)
GLUCOSE: 98 mg/dL (ref 65–99)
POTASSIUM: 4.3 mmol/L (ref 3.5–5.1)
Sodium: 139 mmol/L (ref 135–145)
TOTAL PROTEIN: 7.2 g/dL (ref 6.5–8.1)

## 2017-03-14 NOTE — Progress Notes (Signed)
Please be sure he uses the nasal med

## 2017-03-15 LAB — URINE CULTURE: Culture: NO GROWTH

## 2017-03-16 DIAGNOSIS — F321 Major depressive disorder, single episode, moderate: Secondary | ICD-10-CM | POA: Diagnosis not present

## 2017-03-16 DIAGNOSIS — J439 Emphysema, unspecified: Secondary | ICD-10-CM | POA: Diagnosis not present

## 2017-03-16 DIAGNOSIS — Z720 Tobacco use: Secondary | ICD-10-CM | POA: Diagnosis not present

## 2017-03-16 NOTE — Progress Notes (Signed)
Contacted him and he is using mupirocin

## 2017-03-21 MED ORDER — ACETAMINOPHEN 10 MG/ML IV SOLN
1000.0000 mg | Freq: Once | INTRAVENOUS | Status: AC
  Administered 2017-03-22: 1000 mg via INTRAVENOUS
  Filled 2017-03-21: qty 100

## 2017-03-21 MED ORDER — TRANEXAMIC ACID 1000 MG/10ML IV SOLN
2000.0000 mg | INTRAVENOUS | Status: AC
  Administered 2017-03-22: 2000 mg via TOPICAL
  Filled 2017-03-21: qty 20

## 2017-03-22 ENCOUNTER — Inpatient Hospital Stay (HOSPITAL_COMMUNITY): Payer: Medicare Other | Admitting: Anesthesiology

## 2017-03-22 ENCOUNTER — Encounter (HOSPITAL_COMMUNITY)
Admission: RE | Disposition: A | Discharge status: Home Health | Payer: Self-pay | Source: Ambulatory Visit | Attending: Orthopaedic Surgery

## 2017-03-22 ENCOUNTER — Inpatient Hospital Stay (HOSPITAL_COMMUNITY)
Admission: RE | Admit: 2017-03-22 | Discharge: 2017-03-24 | DRG: 470 | Disposition: A | Discharge status: Home Health | Payer: Medicare Other | Source: Ambulatory Visit | Attending: Orthopaedic Surgery | Admitting: Orthopaedic Surgery

## 2017-03-22 ENCOUNTER — Encounter (HOSPITAL_COMMUNITY): Payer: Self-pay | Admitting: Certified Registered"

## 2017-03-22 ENCOUNTER — Other Ambulatory Visit: Payer: Self-pay

## 2017-03-22 DIAGNOSIS — M1712 Unilateral primary osteoarthritis, left knee: Principal | ICD-10-CM | POA: Diagnosis present

## 2017-03-22 DIAGNOSIS — M25562 Pain in left knee: Secondary | ICD-10-CM | POA: Diagnosis not present

## 2017-03-22 DIAGNOSIS — F419 Anxiety disorder, unspecified: Secondary | ICD-10-CM | POA: Diagnosis not present

## 2017-03-22 DIAGNOSIS — Z7982 Long term (current) use of aspirin: Secondary | ICD-10-CM

## 2017-03-22 DIAGNOSIS — Z79899 Other long term (current) drug therapy: Secondary | ICD-10-CM | POA: Diagnosis not present

## 2017-03-22 DIAGNOSIS — F17210 Nicotine dependence, cigarettes, uncomplicated: Secondary | ICD-10-CM | POA: Diagnosis present

## 2017-03-22 DIAGNOSIS — J449 Chronic obstructive pulmonary disease, unspecified: Secondary | ICD-10-CM | POA: Diagnosis present

## 2017-03-22 DIAGNOSIS — M79609 Pain in unspecified limb: Secondary | ICD-10-CM | POA: Diagnosis not present

## 2017-03-22 DIAGNOSIS — Z96652 Presence of left artificial knee joint: Secondary | ICD-10-CM

## 2017-03-22 DIAGNOSIS — M659 Synovitis and tenosynovitis, unspecified: Secondary | ICD-10-CM | POA: Diagnosis present

## 2017-03-22 DIAGNOSIS — G8918 Other acute postprocedural pain: Secondary | ICD-10-CM | POA: Diagnosis not present

## 2017-03-22 DIAGNOSIS — M25762 Osteophyte, left knee: Secondary | ICD-10-CM | POA: Diagnosis present

## 2017-03-22 DIAGNOSIS — Z9889 Other specified postprocedural states: Secondary | ICD-10-CM | POA: Diagnosis not present

## 2017-03-22 HISTORY — PX: TOTAL KNEE ARTHROPLASTY: SHX125

## 2017-03-22 SURGERY — ARTHROPLASTY, KNEE, TOTAL
Anesthesia: Regional | Site: Knee | Laterality: Left

## 2017-03-22 MED ORDER — DOCUSATE SODIUM 100 MG PO CAPS
100.0000 mg | ORAL_CAPSULE | Freq: Two times a day (BID) | ORAL | Status: DC
  Administered 2017-03-22 – 2017-03-24 (×5): 100 mg via ORAL
  Filled 2017-03-22 (×5): qty 1

## 2017-03-22 MED ORDER — LIDOCAINE 2% (20 MG/ML) 5 ML SYRINGE
INTRAMUSCULAR | Status: AC
  Filled 2017-03-22: qty 5

## 2017-03-22 MED ORDER — PROPOFOL 10 MG/ML IV BOLUS
INTRAVENOUS | Status: DC | PRN
  Administered 2017-03-22: 30 mg via INTRAVENOUS
  Administered 2017-03-22 (×2): 20 mg via INTRAVENOUS

## 2017-03-22 MED ORDER — PROPOFOL 500 MG/50ML IV EMUL
INTRAVENOUS | Status: DC | PRN
  Administered 2017-03-22: 50 ug/kg/min via INTRAVENOUS

## 2017-03-22 MED ORDER — EPHEDRINE 5 MG/ML INJ
INTRAVENOUS | Status: AC
  Filled 2017-03-22: qty 10

## 2017-03-22 MED ORDER — RIVAROXABAN 10 MG PO TABS
10.0000 mg | ORAL_TABLET | Freq: Every day | ORAL | Status: DC
  Administered 2017-03-23 – 2017-03-24 (×2): 10 mg via ORAL
  Filled 2017-03-22 (×2): qty 1

## 2017-03-22 MED ORDER — ALUM & MAG HYDROXIDE-SIMETH 200-200-20 MG/5ML PO SUSP: 30.0000 mL | ORAL | Status: DC | PRN

## 2017-03-22 MED ORDER — 0.9 % SODIUM CHLORIDE (POUR BTL) OPTIME
TOPICAL | Status: DC | PRN
  Administered 2017-03-22: 1000 mL

## 2017-03-22 MED ORDER — MOMETASONE FURO-FORMOTEROL FUM 200-5 MCG/ACT IN AERO
2.0000 | INHALATION_SPRAY | Freq: Two times a day (BID) | RESPIRATORY_TRACT | Status: DC
  Administered 2017-03-22 – 2017-03-24 (×4): 2 via RESPIRATORY_TRACT
  Filled 2017-03-22: qty 8.8

## 2017-03-22 MED ORDER — CHLORHEXIDINE GLUCONATE 4 % EX LIQD: 60.0000 mL | Freq: Once | CUTANEOUS | Status: DC

## 2017-03-22 MED ORDER — VITAMIN B-12 1000 MCG PO TABS
1000.0000 ug | ORAL_TABLET | Freq: Every day | ORAL | Status: DC
  Administered 2017-03-22 – 2017-03-24 (×3): 1000 ug via ORAL
  Filled 2017-03-22 (×3): qty 1

## 2017-03-22 MED ORDER — MENTHOL 3 MG MT LOZG: 1.0000 | LOZENGE | OROMUCOSAL | Status: DC | PRN

## 2017-03-22 MED ORDER — VANCOMYCIN HCL IN DEXTROSE 1-5 GM/200ML-% IV SOLN
1000.0000 mg | Freq: Two times a day (BID) | INTRAVENOUS | Status: AC
  Administered 2017-03-22 – 2017-03-23 (×2): 1000 mg via INTRAVENOUS
  Filled 2017-03-22 (×2): qty 200

## 2017-03-22 MED ORDER — ONDANSETRON HCL 4 MG/2ML IJ SOLN: 4.0000 mg | Freq: Four times a day (QID) | INTRAMUSCULAR | Status: DC | PRN

## 2017-03-22 MED ORDER — DEXMEDETOMIDINE HCL IN NACL 80 MCG/20ML IV SOLN
20.0000 mL | INTRAVENOUS | Status: DC
  Filled 2017-03-22: qty 20

## 2017-03-22 MED ORDER — LIDOCAINE 2% (20 MG/ML) 5 ML SYRINGE
INTRAMUSCULAR | Status: DC | PRN
  Administered 2017-03-22: 40 mg via INTRAVENOUS

## 2017-03-22 MED ORDER — METHOCARBAMOL 500 MG PO TABS
500.0000 mg | ORAL_TABLET | Freq: Four times a day (QID) | ORAL | Status: DC | PRN
  Administered 2017-03-22 – 2017-03-24 (×7): 500 mg via ORAL
  Filled 2017-03-22 (×6): qty 1

## 2017-03-22 MED ORDER — OXYCODONE HCL 5 MG PO TABS
5.0000 mg | ORAL_TABLET | Freq: Once | ORAL | Status: DC | PRN
Start: 2017-03-22 — End: 2017-03-22
  Administered 2017-03-22: 5 mg via ORAL

## 2017-03-22 MED ORDER — PHENYLEPHRINE 40 MCG/ML (10ML) SYRINGE FOR IV PUSH (FOR BLOOD PRESSURE SUPPORT)
PREFILLED_SYRINGE | INTRAVENOUS | Status: AC
  Filled 2017-03-22: qty 10

## 2017-03-22 MED ORDER — ATORVASTATIN CALCIUM 10 MG PO TABS
10.0000 mg | ORAL_TABLET | Freq: Every day | ORAL | Status: DC
  Administered 2017-03-22 – 2017-03-24 (×3): 10 mg via ORAL
  Filled 2017-03-22 (×3): qty 1

## 2017-03-22 MED ORDER — MIDAZOLAM HCL 5 MG/5ML IJ SOLN
INTRAMUSCULAR | Status: DC | PRN
  Administered 2017-03-22 (×2): 1 mg via INTRAVENOUS

## 2017-03-22 MED ORDER — KETOROLAC TROMETHAMINE 15 MG/ML IJ SOLN
15.0000 mg | Freq: Four times a day (QID) | INTRAMUSCULAR | Status: AC
  Administered 2017-03-22 – 2017-03-23 (×4): 15 mg via INTRAVENOUS
  Filled 2017-03-22 (×4): qty 1

## 2017-03-22 MED ORDER — HYDROMORPHONE HCL 1 MG/ML IJ SOLN: 0.2500 mg | INTRAMUSCULAR | Status: DC | PRN

## 2017-03-22 MED ORDER — DIPHENHYDRAMINE HCL 12.5 MG/5ML PO ELIX: 12.5000 mg | ORAL_SOLUTION | ORAL | Status: DC | PRN

## 2017-03-22 MED ORDER — SODIUM CHLORIDE 0.9 % IV SOLN: INTRAVENOUS | Status: DC

## 2017-03-22 MED ORDER — METOCLOPRAMIDE HCL 5 MG/ML IJ SOLN: 5.0000 mg | Freq: Three times a day (TID) | INTRAMUSCULAR | Status: DC | PRN

## 2017-03-22 MED ORDER — PHENOL 1.4 % MT LIQD: 1.0000 | OROMUCOSAL | Status: DC | PRN

## 2017-03-22 MED ORDER — FENTANYL CITRATE (PF) 100 MCG/2ML IJ SOLN
INTRAMUSCULAR | Status: DC | PRN
  Administered 2017-03-22 (×2): 50 ug via INTRAVENOUS
  Administered 2017-03-22: 100 ug via INTRAVENOUS

## 2017-03-22 MED ORDER — METHOCARBAMOL 500 MG PO TABS
ORAL_TABLET | ORAL | Status: AC
  Filled 2017-03-22: qty 1

## 2017-03-22 MED ORDER — ONDANSETRON HCL 4 MG/2ML IJ SOLN
INTRAMUSCULAR | Status: DC | PRN
  Administered 2017-03-22: 4 mg via INTRAVENOUS

## 2017-03-22 MED ORDER — SODIUM CHLORIDE 0.9 % IR SOLN
Status: DC | PRN
  Administered 2017-03-22: 3000 mL

## 2017-03-22 MED ORDER — DEXMEDETOMIDINE HCL 200 MCG/2ML IV SOLN
INTRAVENOUS | Status: DC | PRN
Start: 2017-03-22 — End: 2017-03-22
  Administered 2017-03-22: 12 ug via INTRAVENOUS
  Administered 2017-03-22: 8 ug via INTRAVENOUS

## 2017-03-22 MED ORDER — MIDAZOLAM HCL 2 MG/2ML IJ SOLN
INTRAMUSCULAR | Status: AC
  Filled 2017-03-22: qty 2

## 2017-03-22 MED ORDER — POLYETHYLENE GLYCOL 3350 17 G PO PACK: 17.0000 g | PACK | Freq: Every day | ORAL | Status: DC | PRN

## 2017-03-22 MED ORDER — CEFAZOLIN SODIUM-DEXTROSE 2-4 GM/100ML-% IV SOLN
2.0000 g | INTRAVENOUS | Status: AC
  Administered 2017-03-22: 2 g via INTRAVENOUS
  Filled 2017-03-22: qty 100

## 2017-03-22 MED ORDER — BUPROPION HCL ER (XL) 150 MG PO TB24
150.0000 mg | ORAL_TABLET | Freq: Every day | ORAL | Status: DC
  Administered 2017-03-23 – 2017-03-24 (×2): 150 mg via ORAL
  Filled 2017-03-22 (×2): qty 1

## 2017-03-22 MED ORDER — BISACODYL 10 MG RE SUPP: 10.0000 mg | Freq: Every day | RECTAL | Status: DC | PRN

## 2017-03-22 MED ORDER — ONDANSETRON HCL 4 MG/2ML IJ SOLN
INTRAMUSCULAR | Status: AC
  Filled 2017-03-22: qty 2

## 2017-03-22 MED ORDER — OXYCODONE HCL 5 MG/5ML PO SOLN: 5.0000 mg | Freq: Once | ORAL | Status: DC | PRN

## 2017-03-22 MED ORDER — OXYCODONE HCL 5 MG PO TABS
ORAL_TABLET | ORAL | Status: AC
  Filled 2017-03-22: qty 1

## 2017-03-22 MED ORDER — BUPIVACAINE-EPINEPHRINE 0.25% -1:200000 IJ SOLN
INTRAMUSCULAR | Status: DC | PRN
  Administered 2017-03-22: 30 mL

## 2017-03-22 MED ORDER — SODIUM CHLORIDE 0.9 % IV SOLN
INTRAVENOUS | Status: DC
  Administered 2017-03-22 (×2): via INTRAVENOUS

## 2017-03-22 MED ORDER — ACETAMINOPHEN 10 MG/ML IV SOLN
1000.0000 mg | Freq: Four times a day (QID) | INTRAVENOUS | Status: AC
  Administered 2017-03-22 – 2017-03-23 (×4): 1000 mg via INTRAVENOUS
  Filled 2017-03-22 (×4): qty 100

## 2017-03-22 MED ORDER — BUPIVACAINE-EPINEPHRINE (PF) 0.25% -1:200000 IJ SOLN
INTRAMUSCULAR | Status: AC
  Filled 2017-03-22: qty 30

## 2017-03-22 MED ORDER — FENTANYL CITRATE (PF) 250 MCG/5ML IJ SOLN
INTRAMUSCULAR | Status: AC
Start: 2017-03-22 — End: 2017-03-22
  Filled 2017-03-22: qty 5

## 2017-03-22 MED ORDER — METHOCARBAMOL 1000 MG/10ML IJ SOLN
500.0000 mg | Freq: Four times a day (QID) | INTRAVENOUS | Status: DC | PRN
  Filled 2017-03-22: qty 5

## 2017-03-22 MED ORDER — LACTATED RINGERS IV SOLN
INTRAVENOUS | Status: DC | PRN
  Administered 2017-03-22 (×2): via INTRAVENOUS

## 2017-03-22 MED ORDER — MAGNESIUM CITRATE PO SOLN: 1.0000 | Freq: Once | ORAL | Status: DC | PRN

## 2017-03-22 MED ORDER — METOCLOPRAMIDE HCL 5 MG PO TABS
5.0000 mg | ORAL_TABLET | Freq: Three times a day (TID) | ORAL | Status: DC | PRN
Start: 2017-03-22 — End: 2017-03-24

## 2017-03-22 MED ORDER — BUPIVACAINE LIPOSOME 1.3 % IJ SUSP
20.0000 mL | INTRAMUSCULAR | Status: AC
Start: 2017-03-22 — End: 2017-03-22
  Administered 2017-03-22: 20 mL
  Filled 2017-03-22: qty 20

## 2017-03-22 MED ORDER — ONDANSETRON HCL 4 MG PO TABS: 4.0000 mg | ORAL_TABLET | Freq: Four times a day (QID) | ORAL | Status: DC | PRN

## 2017-03-22 SURGICAL SUPPLY — 64 items
ANCH SUT 2 CP-2 EBND QANCHR+ (Anchor) ×2 IMPLANT
ANCHOR SUPER QUICK (Anchor) ×4 IMPLANT
BAG DECANTER FOR FLEXI CONT (MISCELLANEOUS) ×3 IMPLANT
BANDAGE ESMARK 6X9 LF (GAUZE/BANDAGES/DRESSINGS) ×1 IMPLANT
BLADE SAGITTAL 25.0X1.19X90 (BLADE) ×2 IMPLANT
BLADE SAGITTAL 25.0X1.19X90MM (BLADE) ×1
BNDG CMPR 9X6 STRL LF SNTH (GAUZE/BANDAGES/DRESSINGS) ×1
BNDG ESMARK 6X9 LF (GAUZE/BANDAGES/DRESSINGS) ×3
BOWL SMART MIX CTS (DISPOSABLE) ×3 IMPLANT
CAP KNEE TOTAL 3 SIGMA ×2 IMPLANT
CEMENT HV SMART SET (Cement) ×6 IMPLANT
COVER SURGICAL LIGHT HANDLE (MISCELLANEOUS) ×3 IMPLANT
CUFF TOURNIQUET SINGLE 34IN LL (TOURNIQUET CUFF) ×3 IMPLANT
CUFF TOURNIQUET SINGLE 44IN (TOURNIQUET CUFF) IMPLANT
DECANTER SPIKE VIAL GLASS SM (MISCELLANEOUS) ×3 IMPLANT
DRAPE EXTREMITY T 121X128X90 (DRAPE) ×3 IMPLANT
DRAPE HALF SHEET 40X57 (DRAPES) ×6 IMPLANT
DRSG ADAPTIC 3X8 NADH LF (GAUZE/BANDAGES/DRESSINGS) ×3 IMPLANT
DRSG PAD ABDOMINAL 8X10 ST (GAUZE/BANDAGES/DRESSINGS) ×6 IMPLANT
DURAPREP 26ML APPLICATOR (WOUND CARE) ×6 IMPLANT
ELECT CAUTERY BLADE 6.4 (BLADE) ×3 IMPLANT
ELECT REM PT RETURN 9FT ADLT (ELECTROSURGICAL) ×3
ELECTRODE REM PT RTRN 9FT ADLT (ELECTROSURGICAL) ×1 IMPLANT
EVACUATOR 1/8 PVC DRAIN (DRAIN) IMPLANT
FACESHIELD WRAPAROUND (MASK) ×6 IMPLANT
FACESHIELD WRAPAROUND OR TEAM (MASK) ×2 IMPLANT
GAUZE SPONGE 4X4 12PLY STRL (GAUZE/BANDAGES/DRESSINGS) ×3 IMPLANT
GAUZE SPONGE 4X4 12PLY STRL LF (GAUZE/BANDAGES/DRESSINGS) ×2 IMPLANT
GLOVE BIOGEL PI IND STRL 8 (GLOVE) ×1 IMPLANT
GLOVE BIOGEL PI IND STRL 8.5 (GLOVE) ×1 IMPLANT
GLOVE BIOGEL PI INDICATOR 8 (GLOVE) ×2
GLOVE BIOGEL PI INDICATOR 8.5 (GLOVE) ×2
GLOVE ECLIPSE 8.0 STRL XLNG CF (GLOVE) ×6 IMPLANT
GLOVE ECLIPSE 8.5 STRL (GLOVE) ×6 IMPLANT
GOWN STRL REUS W/ TWL LRG LVL3 (GOWN DISPOSABLE) ×2 IMPLANT
GOWN STRL REUS W/TWL 2XL LVL3 (GOWN DISPOSABLE) ×3 IMPLANT
GOWN STRL REUS W/TWL LRG LVL3 (GOWN DISPOSABLE) ×6
HANDPIECE INTERPULSE COAX TIP (DISPOSABLE) ×3
KIT BASIN OR (CUSTOM PROCEDURE TRAY) ×3 IMPLANT
KIT ROOM TURNOVER OR (KITS) ×3 IMPLANT
MANIFOLD NEPTUNE II (INSTRUMENTS) ×3 IMPLANT
NEEDLE 22X1 1/2 (OR ONLY) (NEEDLE) ×3 IMPLANT
NS IRRIG 1000ML POUR BTL (IV SOLUTION) ×3 IMPLANT
PACK TOTAL JOINT (CUSTOM PROCEDURE TRAY) ×3 IMPLANT
PAD ABD 8X10 STRL (GAUZE/BANDAGES/DRESSINGS) ×2 IMPLANT
PAD ARMBOARD 7.5X6 YLW CONV (MISCELLANEOUS) ×6 IMPLANT
PAD CAST 4YDX4 CTTN HI CHSV (CAST SUPPLIES) ×1 IMPLANT
PADDING CAST COTTON 4X4 STRL (CAST SUPPLIES) ×3
PADDING CAST COTTON 6X4 STRL (CAST SUPPLIES) ×3 IMPLANT
SET HNDPC FAN SPRY TIP SCT (DISPOSABLE) ×1 IMPLANT
STAPLER VISISTAT 35W (STAPLE) ×3 IMPLANT
SUCTION FRAZIER HANDLE 10FR (MISCELLANEOUS) ×2
SUCTION TUBE FRAZIER 10FR DISP (MISCELLANEOUS) ×1 IMPLANT
SURGIFLO W/THROMBIN 8M KIT (HEMOSTASIS) IMPLANT
SUT BONE WAX W31G (SUTURE) ×3 IMPLANT
SUT ETHIBOND NAB CT1 #1 30IN (SUTURE) ×6 IMPLANT
SUT MNCRL AB 3-0 PS2 18 (SUTURE) ×3 IMPLANT
SUT VIC AB 0 CT1 27 (SUTURE) ×3
SUT VIC AB 0 CT1 27XBRD ANBCTR (SUTURE) ×1 IMPLANT
SYR CONTROL 10ML LL (SYRINGE) IMPLANT
TOWEL OR 17X24 6PK STRL BLUE (TOWEL DISPOSABLE) ×3 IMPLANT
TOWEL OR 17X26 10 PK STRL BLUE (TOWEL DISPOSABLE) ×3 IMPLANT
TRAY FOLEY BAG SILVER LF 16FR (SET/KITS/TRAYS/PACK) ×3 IMPLANT
WRAP KNEE MAXI GEL POST OP (GAUZE/BANDAGES/DRESSINGS) ×3 IMPLANT

## 2017-03-22 NOTE — Anesthesia Postprocedure Evaluation (Signed)
Anesthesia Post Note  Patient: Chad Flores  Procedure(s) Performed: LEFT TOTAL KNEE ARTHROPLASTY (Left Knee)     Patient location during evaluation: PACU Anesthesia Type: Regional and Spinal Level of consciousness: oriented and awake and alert Pain management: pain level controlled Vital Signs Assessment: post-procedure vital signs reviewed and stable Respiratory status: spontaneous breathing, respiratory function stable and patient connected to nasal cannula oxygen Cardiovascular status: blood pressure returned to baseline and stable Postop Assessment: no headache, no backache and no apparent nausea or vomiting Anesthetic complications: no    Last Vitals:  Vitals:   03/22/17 1035 03/22/17 1050  BP: (!) 96/56 (!) 96/56  Pulse: (!) 57 (!) 55  Resp: 14 13  Temp:  36.6 C  SpO2: 94% 95%    Last Pain:  Vitals:   03/22/17 1027  TempSrc:   PainSc: 3                  Bettie Swavely,JAMES TERRILL

## 2017-03-22 NOTE — Care Management Note (Signed)
Case Management Note  Patient Details  Name: Chad Flores MRN: [id number] Date of Birth: 1951/01/08  Subjective/Objective:     Left TKA               Action/Plan: Discharge Planning: NCM spoke to pt and wife at bedside. Offered choice for HH/list provided. Will follow up with [facility] to see if pt on preop list. Pt states he will need RW and 3n1 bedside commode. Will have NCM order on 03/23/2017.    Expected Discharge Date:                  Expected Discharge Plan:  [place]  In-House Referral:  NA  Discharge planning Services  CM Consult  Post Acute Care Choice:  Home Health Choice offered to:  Patient, Spouse  DME Arranged:  3-N-1, Walker rolling DME Agency:     HH Arranged:  PT HH Agency:     Status of Service:  In process, will continue to follow  If discussed at Long Length of Stay Meetings, dates discussed:    Additional Comments:  Erenest Rasher, RN 03/22/2017, 5:53 PM

## 2017-03-22 NOTE — Discharge Instructions (Signed)

## 2017-03-22 NOTE — Plan of Care (Signed)
  Activity: Ability to avoid complications of mobility impairment will improve 03/22/2017 1645 - Progressing by Williams Che, RN   Clinical Measurements: Postoperative complications will be avoided or minimized 03/22/2017 1645 - Progressing by Williams Che, RN   Pain Management: Pain level will decrease with appropriate interventions 03/22/2017 1645 - Progressing by Williams Che, RN

## 2017-03-22 NOTE — Anesthesia Preprocedure Evaluation (Addendum)
Anesthesia Evaluation  Patient identified by MRN, date of birth, ID band Patient awake    Reviewed: Allergy & Precautions, NPO status , Patient's Chart, lab work & pertinent test results  Airway Mallampati: I  TM Distance: >3 FB Neck ROM: Full    Dental no notable dental hx.    Pulmonary neg pulmonary ROS, Current Smoker,    breath sounds clear to auscultation       Cardiovascular negative cardio ROS   Rhythm:Regular Rate:Normal     Neuro/Psych negative neurological ROS  negative psych ROS   GI/Hepatic negative GI ROS, Neg liver ROS,   Endo/Other  negative endocrine ROS  Renal/GU negative Renal ROS  negative genitourinary   Musculoskeletal negative musculoskeletal ROS (+) Arthritis ,   Abdominal (+) + obese,   Peds negative pediatric ROS (+)  Hematology negative hematology ROS (+)   Anesthesia Other Findings   Reproductive/Obstetrics negative OB ROS                            Anesthesia Physical Anesthesia Plan  ASA: II  Anesthesia Plan: Spinal   Post-op Pain Management:  Regional for Post-op pain   Induction:   PONV Risk Score and Plan: 1 and Treatment may vary due to age or medical condition and Ondansetron  Airway Management Planned: Simple Face Mask and Natural Airway  Additional Equipment:   Intra-op Plan:   Post-operative Plan:   Informed Consent: I have reviewed the patients History and Physical, chart, labs and discussed the procedure including the risks, benefits and alternatives for the proposed anesthesia with the patient or authorized representative who has indicated his/her understanding and acceptance.     Plan Discussed with: CRNA  Anesthesia Plan Comments:        Anesthesia Quick Evaluation

## 2017-03-22 NOTE — Progress Notes (Signed)
Orthopedic Tech Progress Note Patient Details:  Chad Flores [birth year] [id number]  CPM Left Knee CPM Left Knee: On Left Knee Flexion (Degrees): 90 Left Knee Extension (Degrees): 0 Additional Comments: foot roll   [place] Pink 03/22/2017, 11:45 AM

## 2017-03-22 NOTE — Anesthesia Procedure Notes (Addendum)
Anesthesia Regional Block: Adductor canal block   Pre-Anesthetic Checklist: ,, timeout performed, Correct Patient, Correct Site, Correct Laterality, Correct Procedure, Correct Position, site marked, Risks and benefits discussed,  Surgical consent,  Pre-op evaluation,  At surgeon's request and post-op pain management  Laterality: Left and Lower  Prep: chloraprep       Needles:   Needle Type: Echogenic Stimulator Needle     Needle Length: 9cm  Needle Gauge: 21   Needle insertion depth: 6 cm   Additional Needles:   Procedures:,,,, ultrasound used (permanent image in chart),,,,  Narrative:  Start time: 03/22/2017 6:48 AM End time: 03/22/2017 7:00 AM Injection made incrementally with aspirations every 5 mL.  Performed by: Personally  Anesthesiologist: Rica Koyanagi, MD  Additional Notes: Tolerated well

## 2017-03-22 NOTE — Evaluation (Signed)
Physical Therapy Evaluation Patient Details Name: Chad Flores MRN: [id number] DOB: 1951/05/01 Today's Date: 03/22/2017   History of Present Illness  Pt is a 66 y/o male s/p elective L TKA. PMH includes COPD, smoker, R TKA, anemia, and trigger finger release.   Clinical Impression  Pt is s/p surgery above with deficits below. PTA, pt was independent with functional mobility. Upon eval, pt limited by post op pain and weakness. Required min guard assist for mobility with RW. Verbal cues for maintenance of 50% WB on LLE as well. Reports wife will be able to assist as needed and will need DME below. Reports he will be getting HHPT at d/c. Will continue to follow acutely to maximize functional mobility independence and safety.     Follow Up Recommendations DC plan and follow up therapy as arranged by surgeon;Supervision for mobility/OOB    Equipment Recommendations  Rolling walker with 5" wheels;3in1 (PT)    Recommendations for Other Services       Precautions / Restrictions Precautions Precautions: Knee Precaution Booklet Issued: Yes (comment) Precaution Comments: Reviewed supine knee ther ex  Restrictions Weight Bearing Restrictions: Yes LLE Weight Bearing: Partial weight bearing LLE Partial Weight Bearing Percentage or Pounds: 50      Mobility  Bed Mobility Overal bed mobility: Needs Assistance Bed Mobility: Supine to Sit     Supine to sit: Supervision     General bed mobility comments: Supervision for safety.   Transfers Overall transfer level: Needs assistance Equipment used: Rolling walker (2 wheeled) Transfers: Sit to/from Stand Sit to Stand: Min guard         General transfer comment: Min guard for safety. Demonstrated safe hand placement.   Ambulation/Gait Ambulation/Gait assistance: Min guard Ambulation Distance (Feet): 5 Feet Assistive device: Rolling walker (2 wheeled) Gait Pattern/deviations: Step-to pattern;Decreased step length - right;Decreased  step length - left;Decreased weight shift to left;Antalgic Gait velocity: Decreased  Gait velocity interpretation: Below normal speed for age/gender General Gait Details: Slow, antalgic gait secondary to post op pain and weakness. Verbal cues for sequencing using RW. Complained of pain at IV side, so educated about how to adjust grip on RW. Verbal cues for maintenance of 50% WB.   Stairs            Wheelchair Mobility    Modified Rankin (Stroke Patients Only)       Balance Overall balance assessment: Needs assistance Sitting-balance support: No upper extremity supported;Feet supported Sitting balance-Leahy Scale: Good     Standing balance support: Bilateral upper extremity supported;During functional activity Standing balance-Leahy Scale: Poor Standing balance comment: Reliant on UE support                              Pertinent Vitals/Pain Pain Assessment: 0-10 Pain Score: 4  Pain Location: L knee  Pain Descriptors / Indicators: Aching;Operative site guarding Pain Intervention(s): Limited activity within patient's tolerance;Monitored during session;Repositioned    Home Living Family/patient expects to be discharged to:: Private residence Living Arrangements: Spouse/significant other Available Help at Discharge: Family;Available 24 hours/day Type of Home: House Home Access: Stairs to enter Entrance Stairs-Rails: Right;Left;Can reach both Entrance Stairs-Number of Steps: 5 Home Layout: One level Home Equipment: Crutches      Prior Function Level of Independence: Independent               Hand Dominance   Dominant Hand: Right    Extremity/Trunk Assessment   Upper Extremity Assessment Upper  Extremity Assessment: Defer to OT evaluation    Lower Extremity Assessment Lower Extremity Assessment: LLE deficits/detail LLE Deficits / Details: Reports numbness in big toe. Deficits consistent with post op pain and weakness.     Cervical / Trunk  Assessment Cervical / Trunk Assessment: Normal  Communication   Communication: No difficulties  Cognition Arousal/Alertness: Awake/alert Behavior During Therapy: WFL for tasks assessed/performed Overall Cognitive Status: Within Functional Limits for tasks assessed                                        General Comments      Exercises Total Joint Exercises Ankle Circles/Pumps: AROM;Both;20 reps Quad Sets: AROM;Left;10 reps Towel Squeeze: AROM;Both;10 reps Short Arc Quad: AROM;Left;10 reps Heel Slides: AROM;Left;10 reps Hip ABduction/ADduction: AROM;Left;10 reps   Assessment/Plan    PT Assessment Patient needs continued PT services  PT Problem List Decreased strength;Decreased range of motion;Decreased balance;Decreased mobility;Decreased knowledge of use of DME;Decreased knowledge of precautions;Pain       PT Treatment Interventions DME instruction;Gait training;Stair training;Functional mobility training;Therapeutic exercise;Therapeutic activities;Balance training;Neuromuscular re-education;Patient/family education    PT Goals (Current goals can be found in the Care Plan section)  Acute Rehab PT Goals Patient Stated Goal: to go home  PT Goal Formulation: With patient Time For Goal Achievement: 03/29/17 Potential to Achieve Goals: Good    Frequency 7X/week   Barriers to discharge        Co-evaluation               AM-PAC PT "6 Clicks" Daily Activity  Outcome Measure Difficulty turning over in bed (including adjusting bedclothes, sheets and blankets)?: None Difficulty moving from lying on back to sitting on the side of the bed? : None Difficulty sitting down on and standing up from a chair with arms (e.g., wheelchair, bedside commode, etc,.)?: Unable Help needed moving to and from a bed to chair (including a wheelchair)?: A Little Help needed walking in hospital room?: A Little Help needed climbing 3-5 steps with a railing? : A Little 6 Click  Score: 18    End of Session Equipment Utilized During Treatment: Gait belt Activity Tolerance: Patient tolerated treatment well Patient left: in chair;with call bell/phone within reach Nurse Communication: Mobility status PT Visit Diagnosis: Other abnormalities of gait and mobility (R26.89);Pain Pain - Right/Left: Left Pain - part of body: Knee    Time: 1610-9604 PT Time Calculation (min) (ACUTE ONLY): 26 min   Charges:   PT Evaluation $PT Eval Low Complexity: 1 Low PT Treatments $Therapeutic Exercise: 8-22 mins   PT G Codes:        Leighton Ruff, PT, DPT  Acute Rehabilitation Services  Pager: [phone]   Rudean Hitt 03/22/2017, 2:34 PM

## 2017-03-22 NOTE — Anesthesia Procedure Notes (Signed)
Procedure Name: MAC Date/Time: 03/22/2017 7:15 AM Performed by: Orlie Dakin, CRNA Pre-anesthesia Checklist: Patient identified, Emergency Drugs available, Suction available, Patient being monitored and Timeout performed Patient Re-evaluated:Patient Re-evaluated prior to induction Oxygen Delivery Method: Nasal cannula Preoxygenation: Pre-oxygenation with 100% oxygen

## 2017-03-22 NOTE — H&P (Signed)
The recent History & Physical has been reviewed. I have personally examined the patient today. There is no interval change to the documented History & Physical. The patient would like to proceed with the procedure.  Garald Balding 03/22/2017,  7:05 AM

## 2017-03-22 NOTE — Anesthesia Procedure Notes (Signed)
Spinal  Patient location during procedure: OR Start time: 03/22/2017 7:12 AM End time: 03/22/2017 7:18 AM Staffing Anesthesiologist: Rica Koyanagi, MD Preanesthetic Checklist Completed: patient identified, site marked, surgical consent, pre-op evaluation, timeout performed, IV checked, risks and benefits discussed and monitors and equipment checked Spinal Block Patient position: sitting Prep: ChloraPrep and site prepped and draped Patient monitoring: cardiac monitor, continuous pulse ox and blood pressure Approach: midline Location: L3-4 Needle Needle type: Pencan  Needle gauge: 24 G Needle length: 9 cm Needle insertion depth: 6 cm Assessment Sensory level: T8

## 2017-03-22 NOTE — Op Note (Signed)
NAME:  Steffey, Chad Flores                   ACCOUNT NO.:  MEDICAL RECORD NO.:  [id number]  LOCATION:                                 FACILITY:  PHYSICIAN:  Vonna Kotyk. Durward Fortes, M.D.    DATE OF BIRTH:  DATE OF PROCEDURE:  03/22/2017 DATE OF DISCHARGE:                              OPERATIVE REPORT   PREOPERATIVE DIAGNOSIS:  End-stage osteoarthritis, left knee.  POSTOPERATIVE DIAGNOSIS:  End-stage osteoarthritis, left knee.  PROCEDURE:  Left total knee replacement.  SURGEON:  Vonna Kotyk. Durward Fortes, M.D.  ASSISTANT:  Biagio Borg, PA-C.  ANESTHESIA:  Spinal with adductor canal block and IV sedation.  COMPLICATIONS:  None.  COMPONENTS:  DePuy LCS standard plus femoral component, a #4 rotating keeled tibial tray with a 10-mm polyethylene bridging bearing and metal- backed 3-peg rotating patella.  Components were secured with polymethyl methacrylate.  DESCRIPTION OF PROCEDURE:  Mr. Daughtridge was met in the holding area, identified the left knee as appropriate operative site and marked it accordingly.  He did receive a preoperative adductor canal block per Anesthesia.  Mr. Jayson was then transported to room #7.  Spinal anesthesia was administered by Anesthesia without difficulty.  Under IV sedation, a tourniquet was applied to the left thigh.  The left lower extremity was then prepped with chlorhexidine scrub and DuraPrep x2. Sterile draping was performed.  Time-out was called.  The left lower extremity was then Esmarch exsanguinated with a proximal tourniquet at 350 mmHg.  A midline longitudinal incision was made centered about the patella extending from the superior pouch to the tibial tubercle.  There had been an old incision medially from prior surgery many years ago.  This was not crossed or entered.  Via sharp dissection, incision was carried down to the subcutaneous tissue.  First layer of capsule was incised in midline and medial parapatellar incision was made with the  Bovie.  There was abundant scar tissue along medially.  I was able to elevate the patella with partial avulsion of the patellar tendon.  There was considerable scarring along the medial compartment appeared that he had an old MCL repair.  I did a subperiosteal dissection of the soft tissue.  At that point, I could evert the patella 180 degrees and flex the knee 90 degrees.  There was a moderate amount of beefy red synovitis.  Synovectomy was performed.  The large osteophytes along the medial and lateral femoral condyle were removed.  There was near complete absence of articular cartilage in the medial compartment to a larger extent of the same on the lateral compartment.  Osteophytes were identified along the medial tibial plateau as well, these were removed.  I then sized to a standard plus femoral component.  First, bony cut was then made transversely in the proximal tibia with a 7-degree angle of declination.  After each bony cut on the tibia and femur, I used an external tibial guide to assure we had appropriate alignment.  Subsequent cuts were then made on the femur using the standard plus femoral jig.  I used a 4-degree distal femoral valgus cut. MCL and LCL remained intact throughout the procedure.  Laminar spreaders were then inserted  along the medial lateral compartment.  I removed medial and lateral menisci as well as ACL and PCL.  There was 1 large loose body measuring over a centimeter in diameter posteriorly.  A 3/4- inch curved osteotome was used to remove any osteophytes in the posterior femoral condyles both medially and laterally.  I sized of a 10 mm flexion and extension gaps, which were perfectly symmetrical.  Final cut was then made on the femur for tapering purpose to obtain the central hole.  Retractors were then placed around the tibia, was advanced anteriorly and measured a #4 tibial tray.  This was pinned in place and alignment checked.  Central hole was then  made followed by the keeled cut.  With the tibial jig in place, a 10 mm polyethylene bridging bearing trial was applied, followed by the trial standard plus femoral component.  The entire construct was reduced and through a full range of motion, remained perfectly stable.  There was no malrotation of the components.  Patella was then prepared by removing 11 mm of bone leaving 13 mm of patella thickness.  Three holes were then made.  Trial patella inserted and through a full range of motion, remained perfectly stable.  Trial components were then removed.  The joint was copiously irrigated with saline solution.  The final components were then impacted carefully with polymethylmethacrylate.  Initially applied the tibial component followed by the 10-mm polyethylene bridging bearing and then the femoral component.  These were impacted.  Extraneous methacrylate was removed from the periphery of the components.  Patella was applied with methacrylate and a patellar clamp.  At approximately 16 minutes, the methacrylate had matured, during which time we irrigated the joint and then injected 0.25% Marcaine with epinephrine.  I inserted 2 Mitek anchors into the tibial tubercle to repair the patellar tendon avulsion.  Tourniquet was deflated at 73 minutes.  We applied tranexamic acid topically.  We had a nice dry field.  The joint was then explored without evidence of further loose material.  I repaired the patellar tendon of all partial avulsion and then, repaired the deep capsule with a running #1 Ethibond suture.  Deep capsule was closed with Vicryl and 3-0 Monocryl.  Skin closed with skin clips.  A sterile drape and bulky dressing were applied, followed by the patient's support stocking.  The patient tolerated the procedure without complications.     Vonna Kotyk. Durward Fortes, M.D.     PWW/MEDQ  D:  03/22/2017  T:  03/22/2017  Job:  [id number]

## 2017-03-22 NOTE — Op Note (Signed)
PATIENT ID:      Chad Flores  MRN:     [id number] DOB/AGE:    Oct 30, 1950 / 66 y.o.       OPERATIVE REPORT    DATE OF PROCEDURE:  03/22/2017       PREOPERATIVE DIAGNOSIS: End Stage  Osteoarthritis Left Knee                                                       Estimated body mass index is 29.06 kg/m as calculated from the following:   Height as of 03/14/17: 5' 10.5" (1.791 m).   Weight as of 03/14/17: 205 lb 6.4 oz (93.2 kg).     POSTOPERATIVE DIAGNOSIS: End Stage  Osteoarthritis Left Knee                                                                     Estimated body mass index is 29.06 kg/m as calculated from the following:   Height as of 03/14/17: 5' 10.5" (1.791 m).   Weight as of 03/14/17: 205 lb 6.4 oz (93.2 kg).     PROCEDURE:  Procedure(s): LEFT TOTAL KNEE ARTHROPLASTY      SURGEON:  [name], MD    ASSISTANT:   Biagio Borg, PA-C   (Present and scrubbed throughout the case, critical for assistance with exposure, retraction, instrumentation, and closure.)          ANESTHESIA: regional, spinal and IV sedation     DRAINS: none :      TOURNIQUET TIME:  Total Tourniquet Time Documented: Thigh (Left) - 75 minutes Total: Thigh (Left) - 75 minutes     COMPLICATIONS:  None   CONDITION:  stable  PROCEDURE IN DETAIL: [place] 03/22/2017, 9:07 AM

## 2017-03-22 NOTE — Transfer of Care (Signed)
Immediate Anesthesia Transfer of Care Note  Patient: Chad Flores  Procedure(s) Performed: LEFT TOTAL KNEE ARTHROPLASTY (Left Knee)  Patient Location: PACU  Anesthesia Type:Regional and Spinal  Level of Consciousness: alert , oriented and patient cooperative  Airway & Oxygen Therapy: Patient Spontanous Breathing and Patient connected to face mask oxygen  Post-op Assessment: Report given to RN and Post -op Vital signs reviewed and stable  Post vital signs: Reviewed and stable  Last Vitals:  Vitals:   03/22/17 0631  BP: 125/79  Pulse: 82  Resp: 18  Temp: 36.8 C  SpO2: 97%    Last Pain:  Vitals:   03/22/17 0631  TempSrc: Oral      Patients Stated Pain Goal: 3 (16/42/90 3795)  Complications: No apparent anesthesia complications

## 2017-03-23 ENCOUNTER — Encounter (HOSPITAL_COMMUNITY): Payer: Self-pay | Admitting: General Practice

## 2017-03-23 LAB — BASIC METABOLIC PANEL
ANION GAP: 3 — AB (ref 5–15)
BUN: 12 mg/dL (ref 6–20)
CALCIUM: 8.2 mg/dL — AB (ref 8.9–10.3)
CO2: 28 mmol/L (ref 22–32)
Chloride: 105 mmol/L (ref 101–111)
Creatinine, Ser: 1.15 mg/dL (ref 0.61–1.24)
GFR calc non Af Amer: >60 mL/min (ref >60)
Glucose, Bld: 136 mg/dL — ABNORMAL HIGH (ref 65–99)
Potassium: 4 mmol/L (ref 3.5–5.1)
Sodium: 136 mmol/L (ref 135–145)

## 2017-03-23 LAB — CBC
HEMATOCRIT: 38.9 % — AB (ref 39.0–52.0)
Hemoglobin: 13.1 g/dL (ref 13.0–17.0)
MCH: 30.7 pg (ref 26.0–34.0)
MCHC: 33.7 g/dL (ref 30.0–36.0)
MCV: 91.1 fL (ref 78.0–100.0)
Platelets: 148 10*3/uL — ABNORMAL LOW (ref 150–400)
RBC: 4.27 MIL/uL (ref 4.22–5.81)
RDW: 12.4 % (ref 11.5–15.5)
WBC: 6.8 10*3/uL (ref 4.0–10.5)

## 2017-03-23 MED ORDER — SODIUM CHLORIDE 0.9% FLUSH: 3.0000 mL | INTRAVENOUS | Status: DC | PRN

## 2017-03-23 MED ORDER — SODIUM CHLORIDE 0.9 % IV SOLN: 250.0000 mL | INTRAVENOUS | Status: DC | PRN

## 2017-03-23 MED ORDER — SODIUM CHLORIDE 0.9% FLUSH
3.0000 mL | Freq: Two times a day (BID) | INTRAVENOUS | Status: DC
  Administered 2017-03-23 – 2017-03-24 (×3): 3 mL via INTRAVENOUS

## 2017-03-23 NOTE — Plan of Care (Signed)
  Clinical Measurements: Postoperative complications will be avoided or minimized 03/23/2017 1037 - Progressing by Williams Che, RN   Pain Management: Pain level will decrease with appropriate interventions 03/23/2017 1037 - Progressing by Williams Che, RN   Activity: Risk for activity intolerance will decrease 03/23/2017 1037 - Progressing by Williams Che, RN   Pain Managment: General experience of comfort will improve 03/23/2017 1037 - Progressing by Williams Che, RN

## 2017-03-23 NOTE — Progress Notes (Signed)
Physical Therapy Treatment Patient Details Name: Chad Flores MRN: [id number] DOB: April 07, 1951 Today's Date: 03/23/2017    History of Present Illness Pt is a 66 y/o male s/p elective L TKA. PMH includes COPD, smoker, R TKA, anemia, and trigger finger release.     PT Comments    Pt progressing well towards physical therapy goals. Was able to perform transfers and ambulation with gross min guard assist to supervision for safety. Pt anticipates d/c home tomorrow. Will plan to initiate stair training in afternoon session in preparation. Will continue to follow.   Follow Up Recommendations  DC plan and follow up therapy as arranged by surgeon;Supervision for mobility/OOB     Equipment Recommendations  Rolling walker with 5" wheels;3in1 (PT)    Recommendations for Other Services       Precautions / Restrictions Precautions Precautions: Knee Precaution Comments: Pt was educated on NO roll/ice pack/pillow under knee.  Restrictions Weight Bearing Restrictions: Yes LLE Weight Bearing: Partial weight bearing LLE Partial Weight Bearing Percentage or Pounds: 50    Mobility  Bed Mobility Overal bed mobility: Needs Assistance Bed Mobility: Supine to Sit     Supine to sit: Supervision     General bed mobility comments: No use of rails, and no use of L hand due to IV placement and pain. Pt was able to transition to EOB without assistance.   Transfers Overall transfer level: Needs assistance Equipment used: Rolling walker (2 wheeled) Transfers: Sit to/from Stand Sit to Stand: Min guard         General transfer comment: Close guard provided initially. Pt was able to demonstrate proper hand placement on seated surface for safety.   Ambulation/Gait Ambulation/Gait assistance: Min guard Ambulation Distance (Feet): 300 Feet Assistive device: Rolling walker (2 wheeled) Gait Pattern/deviations: Step-to pattern;Decreased step length - right;Decreased step length - left;Decreased  weight shift to left;Antalgic Gait velocity: Decreased  Gait velocity interpretation: Below normal speed for age/gender General Gait Details: VC's for sequencing, improved posture and general safety with the RW. Pt was able to maintain PWB status well. RW adjusted for improved posture as well.    Stairs            Wheelchair Mobility    Modified Rankin (Stroke Patients Only)       Balance Overall balance assessment: Needs assistance Sitting-balance support: No upper extremity supported;Feet supported Sitting balance-Leahy Scale: Good     Standing balance support: Bilateral upper extremity supported;During functional activity Standing balance-Leahy Scale: Poor Standing balance comment: Reliant on UE support                             Cognition Arousal/Alertness: Awake/alert Behavior During Therapy: WFL for tasks assessed/performed Overall Cognitive Status: Within Functional Limits for tasks assessed                                        Exercises Total Joint Exercises Ankle Circles/Pumps: 10 reps Quad Sets: 10 reps Heel Slides: 5 reps(stretching for ROM) Hip ABduction/ADduction: 10 reps Straight Leg Raises: 5 reps Goniometric ROM: 7-73 AROM in sitting    General Comments        Pertinent Vitals/Pain Pain Assessment: Faces Faces Pain Scale: Hurts little more Pain Location: L knee  Pain Descriptors / Indicators: Aching;Operative site guarding Pain Intervention(s): Limited activity within patient's tolerance;Monitored during session;Repositioned  Home Living                      Prior Function            PT Goals (current goals can now be found in the care plan section) Acute Rehab PT Goals Patient Stated Goal: to go home  PT Goal Formulation: With patient Time For Goal Achievement: 03/29/17 Potential to Achieve Goals: Good Progress towards PT goals: Progressing toward goals    Frequency     7X/week      PT Plan Current plan remains appropriate    Co-evaluation              AM-PAC PT "6 Clicks" Daily Activity  Outcome Measure  Difficulty turning over in bed (including adjusting bedclothes, sheets and blankets)?: None Difficulty moving from lying on back to sitting on the side of the bed? : None Difficulty sitting down on and standing up from a chair with arms (e.g., wheelchair, bedside commode, etc,.)?: Unable Help needed moving to and from a bed to chair (including a wheelchair)?: A Little Help needed walking in hospital room?: A Little Help needed climbing 3-5 steps with a railing? : A Little 6 Click Score: 18    End of Session Equipment Utilized During Treatment: Gait belt Activity Tolerance: Patient tolerated treatment well Patient left: in chair;with call bell/phone within reach Nurse Communication: Mobility status PT Visit Diagnosis: Other abnormalities of gait and mobility (R26.89);Pain Pain - Right/Left: Left Pain - part of body: Knee     Time: 0902-0936 PT Time Calculation (min) (ACUTE ONLY): 34 min  Charges:  $Gait Training: 8-22 mins $Therapeutic Exercise: 8-22 mins                    G Codes:       Chad Flores, PT, DPT Acute Rehabilitation Services Pager: [phone]    [name] 03/23/2017, 10:05 AM

## 2017-03-23 NOTE — Evaluation (Signed)
Occupational Therapy Evaluation Patient Details Name: Chad Flores MRN: [id number] DOB: 1951-01-12 Today's Date: 03/23/2017    History of Present Illness Pt is a 66 y/o male s/p elective L TKA. PMH includes COPD, smoker, R TKA, anemia, and trigger finger release.    Clinical Impression   This 66 y/o M presents with the above. At baseline Pt is independent with ADLs and functional mobility. Pt completed room level functional mobility, toilet and shower transfer, standing grooming ADLs at RW level with overall MinGuard assist, demonstrates good carryover of maintaining PWB status throughout. Pt currently requires MinA for LB ADLs secondary to LLE functional limitations.  Pt reports he will be returning home with spouse who is able to provide supervision/assist PRN after discharge. Education provided and questions answered throughout session. Feel Pt will safely return home with available assist from spouse for ADLs PRN. No further acute OT needs identified at this time. Will sign off.     Follow Up Recommendations  DC plan and follow up therapy as arranged by surgeon;Supervision/Assistance - 24 hour    Equipment Recommendations  3 in 1 bedside commode           Precautions / Restrictions Precautions Precautions: Knee Precaution Comments: Pt was educated on NO roll/ice pack/pillow under knee.  Restrictions Weight Bearing Restrictions: Yes LLE Weight Bearing: Partial weight bearing LLE Partial Weight Bearing Percentage or Pounds: 50      Mobility Bed Mobility Overal bed mobility: Needs Assistance Bed Mobility: Supine to Sit     Supine to sit: Supervision     General bed mobility comments: in recliner upon arrival   Transfers Overall transfer level: Needs assistance Equipment used: Rolling walker (2 wheeled) Transfers: Sit to/from Stand Sit to Stand: Min guard         General transfer comment: Close guard provided initially. Pt was able to demonstrate proper hand  placement on seated surface for safety.     Balance Overall balance assessment: Needs assistance Sitting-balance support: No upper extremity supported;Feet supported Sitting balance-Leahy Scale: Good     Standing balance support: Bilateral upper extremity supported;During functional activity Standing balance-Leahy Scale: Fair Standing balance comment: Pt able to maintain static standing while washing hands at sink with close guard for safety; reliant on UE support during mobility                            ADL either performed or assessed with clinical judgement   ADL Overall ADL's : Needs assistance/impaired Eating/Feeding: Independent;Sitting   Grooming: Wash/dry hands;Min guard;Standing   Upper Body Bathing: Min guard;Sitting   Lower Body Bathing: Min guard;Sit to/from stand Lower Body Bathing Details (indicate cue type and reason): educated on completing bathing task in sitting for increased safety during task completion  Upper Body Dressing : Set up;Sitting   Lower Body Dressing: Minimal assistance;Sit to/from stand   Toilet Transfer: Min guard;Ambulation;Comfort height toilet;Grab bars;RW   Toileting- Water quality scientist and Hygiene: Min guard;Sit to/from stand   Tub/ Shower Transfer: Walk-in shower;Min guard;Ambulation;3 in 1;Rolling walker Tub/Shower Transfer Details (indicate cue type and reason): simulated in room; Pt completing transfer with close guard for safety Functional mobility during ADLs: Min guard;Rolling walker General ADL Comments: Pt demonstrates ablility to complete room level functional mobility, transfers, and standing ADLs with good carryover of PWB status; educated on compensatory techniques and safety during ADL completion      Pertinent Vitals/Pain Pain Assessment: 0-10 Pain Score: 4  Faces Pain Scale: Hurts little more Pain Location: L knee  Pain Descriptors / Indicators: Aching;Operative site guarding Pain Intervention(s):  Monitored during session;Limited activity within patient's tolerance     Hand Dominance Right   Extremity/Trunk Assessment Upper Extremity Assessment Upper Extremity Assessment: Overall WFL for tasks assessed   Lower Extremity Assessment Lower Extremity Assessment: Defer to PT evaluation   Cervical / Trunk Assessment Cervical / Trunk Assessment: Normal   Communication Communication Communication: No difficulties   Cognition Arousal/Alertness: Awake/alert Behavior During Therapy: WFL for tasks assessed/performed Overall Cognitive Status: Within Functional Limits for tasks assessed                                                     Home Living Family/patient expects to be discharged to:: Private residence Living Arrangements: Spouse/significant other Available Help at Discharge: Family;Available 24 hours/day Type of Home: House Home Access: Stairs to enter [employer] of Steps: 5 Entrance Stairs-Rails: Right;Left;Can reach both Home Layout: One level     Bathroom Shower/Tub: Tub/shower unit;Walk-in shower   Bathroom Toilet: Handicapped height     Home Equipment: Crutches          Prior Functioning/Environment Level of Independence: Independent                 OT Problem List: Impaired balance (sitting and/or standing);Decreased range of motion;Decreased knowledge of use of DME or AE;Decreased knowledge of precautions            OT Goals(Current goals can be found in the care plan section) Acute Rehab OT Goals Patient Stated Goal: to go home  OT Goal Formulation: All assessment and education complete, DC therapy                                 AM-PAC PT "6 Clicks" Daily Activity     Outcome Measure Help from another person eating meals?: None Help from another person taking care of personal grooming?: None Help from another person toileting, which includes using toliet, bedpan, or urinal?: A Little Help  from another person bathing (including washing, rinsing, drying)?: A Little Help from another person to put on and taking off regular upper body clothing?: None Help from another person to put on and taking off regular lower body clothing?: A Little 6 Click Score: 21   End of Session Equipment Utilized During Treatment: Gait belt;Rolling walker CPM Left Knee CPM Left Knee: Off Nurse Communication: Mobility status  Activity Tolerance: Patient tolerated treatment well Patient left: in chair;with call bell/phone within reach  OT Visit Diagnosis: Unsteadiness on feet (R26.81);Other abnormalities of gait and mobility (R26.89)                Time: 7342-8768 OT Time Calculation (min): 27 min Charges:  OT General Charges $OT Visit: 1 Visit OT Evaluation $OT Eval Low Complexity: 1 Low OT Treatments $Self Care/Home Management : 8-22 mins G-Codes:     Lou Cal, OT Pager [phone] 03/23/2017   Raymondo Band 03/23/2017, 12:33 PM

## 2017-03-23 NOTE — Progress Notes (Signed)
PATIENT ID: Chad Flores        MRN:  [id number]          DOB/AGE: 67-02-1951 / 67 y.o.    [name], MD   Biagio Borg, PA-C [address] [place], [place]  [address]                             [phone]   PROGRESS NOTE  Subjective:  negative for Chest Pain  negative for Shortness of Breath  negative for Nausea/Vomiting   negative for Calf Pain    Tolerating Diet: yes         Patient reports pain as mild and moderate.     Minimal pain, Tolerating CPM  Objective: Vital signs in last 24 hours:    Patient Vitals for the past 24 hrs:  BP Temp Temp src Pulse Resp SpO2  03/23/17 0500 124/70 98.3 F (36.8 C) Oral 73 18 97 %  03/23/17 0022 (!) 105/57 98 F (36.7 C) Oral 75 15 96 %  03/22/17 2209 (!) 103/59 98.5 F (36.9 C) Oral 75 16 93 %  03/22/17 2022 - - - 61 16 96 %  03/22/17 1124 102/62 97.8 F (36.6 C) Oral (!) 59 16 95 %  03/22/17 1050 (!) 96/56 97.8 F (36.6 C) - (!) 55 13 95 %  03/22/17 1035 (!) 96/56 - - (!) 57 14 94 %  03/22/17 1020 94/62 - - (!) 56 14 98 %  03/22/17 1005 (!) 90/54 - - 65 14 97 %  03/22/17 0950 (!) 86/56 - - 69 20 96 %  03/22/17 0936 (!) 96/55 (!) 97 F (36.1 C) - 73 14 95 %      Intake/Output from previous day:   11/13 0701 - 11/14 0700 In: 3360 [P.O.:960; I.V.:2200] Out: 1950 [Urine:1850]   Intake/Output this shift:   No intake/output data recorded.   Intake/Output      11/13 0701 - 11/14 0700 11/14 0701 - 11/15 0700   P.O. 960    I.V. 2200    IV Piggyback 200    Total Intake 3360    Urine 1850    Blood 100    Total Output 1950    Net +1410            LABORATORY DATA: Recent Labs    03/23/17 0417  WBC 6.8  HGB 13.1  HCT 38.9*  PLT 148*   Recent Labs    03/23/17 0417  NA 136  K 4.0  CL 105  CO2 28  BUN 12  CREATININE 1.15  GLUCOSE 136*  CALCIUM 8.2*   Lab Results  Component Value Date   INR 0.92 03/14/2017    Recent Radiographic Studies :  Dg Chest 2 View  Result Date:  03/14/2017 CLINICAL DATA:  Shortness of breath with activity. Preoperative evaluation for left knee surgery EXAM: CHEST  2 VIEW COMPARISON:  Chest CT July 29, 2016 FINDINGS: There is slight bibasilar atelectasis. There is a a 6 x 5 mm nodular type opacity in left base. Lungs elsewhere are clear. Heart size and pulmonary vascular normal. No adenopathy. No evident bone lesions. IMPRESSION: 6 x 5 mm opacity left base, likely scar based on prior CT. A follow-up chest radiograph in 6 months to assess for stability would be reasonable given this circumstance. There is slight bibasilar atelectasis. No edema or consolidation. Cardiac silhouette within normal limits. Electronically Signed   By:  Lowella Grip III M.D.   On: 03/14/2017 09:31     Examination:  General appearance: alert, cooperative, mild distress and moderate distress Resp: clear to auscultation bilaterally Cardio: regular rate and rhythm GI: normal findings: bowel sounds normal  Wound Exam: clean, dry, intact dressing  Drainage:  None: wound tissue dry  Motor Exam: EHL, FHL, Anterior Tibial and Posterior Tibial Intact  Sensory Exam: Superficial Peroneal, Deep Peroneal and Tibial normal  Vascular Exam: Left posterior tibial artery has 1+ (weak) pulse  Assessment:    1 Day Post-Op  Procedure(s) (LRB): LEFT TOTAL KNEE ARTHROPLASTY (Left)  ADDITIONAL DIAGNOSIS:  Principal Problem:   Primary osteoarthritis of left knee Active Problems:   S/P total knee replacement using cement, left     Plan: Physical Therapy as ordered Partial Weight Bearing @ 50% (PWB)  DVT Prophylaxis:  Xarelto  DISCHARGE PLAN: Home  DISCHARGE NEEDS: HHPT, CPM, Walker and 3-in-1 comode seat        Biagio Borg  03/23/2017 8:17 AM

## 2017-03-23 NOTE — Care Management (Signed)
Patient  was preoperatively setup with [facility].

## 2017-03-23 NOTE — Progress Notes (Signed)
Physical Therapy Treatment Patient Details Name: Chad Flores MRN: [id number] DOB: Oct 01, 1950 Today's Date: 03/23/2017    History of Present Illness Pt is a 66 y/o male s/p elective L TKA. PMH includes COPD, smoker, R TKA, anemia, and trigger finger release.     PT Comments    Pt progressing towards physical therapy goals. Was able to perform transfers and ambulation with gross min guard to supervision for safety with RW. Pt was able to improve overall gait pattern with good maintenance of PWB status. Pt was able to demonstrate stair negotiation, however feel he will benefit from another session to practice stairs again before d/c. Will continue to follow.    Follow Up Recommendations  DC plan and follow up therapy as arranged by surgeon;Supervision for mobility/OOB     Equipment Recommendations  Rolling walker with 5" wheels;3in1 (PT)    Recommendations for Other Services       Precautions / Restrictions Precautions Precautions: Knee Precaution Booklet Issued: Yes (comment) Precaution Comments: Pt was educated on NO roll/ice pack/pillow under knee.  Restrictions Weight Bearing Restrictions: Yes LLE Weight Bearing: Partial weight bearing LLE Partial Weight Bearing Percentage or Pounds: 50    Mobility  Bed Mobility Overal bed mobility: Needs Assistance Bed Mobility: Supine to Sit     Supine to sit: Supervision     General bed mobility comments: No assist required for pt to transition to EOB. Increased time.   Transfers Overall transfer level: Needs assistance Equipment used: Rolling walker (2 wheeled) Transfers: Sit to/from Stand Sit to Stand: Min guard         General transfer comment: Close guard provided initially. Pt was able to demonstrate proper hand placement on seated surface for safety.   Ambulation/Gait Ambulation/Gait assistance: Min guard Ambulation Distance (Feet): 500 Feet Assistive device: Rolling walker (2 wheeled) Gait  Pattern/deviations: Step-to pattern;Decreased step length - right;Decreased step length - left;Decreased weight shift to left;Antalgic;Step-through pattern Gait velocity: Decreased  Gait velocity interpretation: Below normal speed for age/gender General Gait Details: VC's for sequencing, improved posture and general safety with the RW. Pt was able to maintain PWB status well.   Stairs Stairs: Yes   Stair Management: Two rails;Step to pattern;Backwards Number of Stairs: 4 General stair comments: Pt not comfortable utilizing RW alone for stair negotiation as he had a traumatic fall down a large flight of stairs years ago. Pt felt more comfortable using railings which he will have at home. Pt ascended stairs backwards with BUE support on railings. Pt did practice one stair with RW.   Wheelchair Mobility    Modified Rankin (Stroke Patients Only)       Balance Overall balance assessment: Needs assistance Sitting-balance support: No upper extremity supported;Feet supported Sitting balance-Leahy Scale: Good     Standing balance support: Bilateral upper extremity supported;During functional activity Standing balance-Leahy Scale: Fair Standing balance comment: Pt able to maintain static standing while washing hands at sink with close guard for safety; reliant on UE support during mobility                             Cognition Arousal/Alertness: Awake/alert Behavior During Therapy: WFL for tasks assessed/performed Overall Cognitive Status: Within Functional Limits for tasks assessed  Exercises Total Joint Exercises Quad Sets: 10 reps Towel Squeeze: 10 reps Heel Slides: 5 reps(stretching for ROM) Hip ABduction/ADduction: 10 reps    General Comments        Pertinent Vitals/Pain Pain Assessment: Faces Pain Score: 4  Faces Pain Scale: Hurts even more Pain Location: L knee  Pain Descriptors / Indicators:  Aching;Operative site guarding Pain Intervention(s): Monitored during session    Home Living Family/patient expects to be discharged to:: Private residence Living Arrangements: Spouse/significant other Available Help at Discharge: Family;Available 24 hours/day Type of Home: House Home Access: Stairs to enter Entrance Stairs-Rails: Right;Left;Can reach both Home Layout: One level Home Equipment: Crutches      Prior Function Level of Independence: Independent          PT Goals (current goals can now be found in the care plan section) Acute Rehab PT Goals Patient Stated Goal: to go home  PT Goal Formulation: With patient Time For Goal Achievement: 03/29/17 Potential to Achieve Goals: Good Progress towards PT goals: Progressing toward goals    Frequency    7X/week      PT Plan Current plan remains appropriate    Co-evaluation              AM-PAC PT "6 Clicks" Daily Activity  Outcome Measure  Difficulty turning over in bed (including adjusting bedclothes, sheets and blankets)?: None Difficulty moving from lying on back to sitting on the side of the bed? : None Difficulty sitting down on and standing up from a chair with arms (e.g., wheelchair, bedside commode, etc,.)?: Unable Help needed moving to and from a bed to chair (including a wheelchair)?: A Little Help needed walking in hospital room?: A Little Help needed climbing 3-5 steps with a railing? : A Little 6 Click Score: 18    End of Session Equipment Utilized During Treatment: Gait belt Activity Tolerance: Patient tolerated treatment well Patient left: in chair;with call bell/phone within reach Nurse Communication: Mobility status PT Visit Diagnosis: Other abnormalities of gait and mobility (R26.89);Pain Pain - Right/Left: Left Pain - part of body: Knee     Time: 1351-1434 PT Time Calculation (min) (ACUTE ONLY): 43 min  Charges:  $Gait Training: 23-37 mins $Therapeutic Exercise: 8-22 mins                     G Codes:       Chad Flores, PT, DPT Acute Rehabilitation Services Pager: [phone]    Chad Flores 03/23/2017, 3:16 PM

## 2017-03-24 ENCOUNTER — Inpatient Hospital Stay (HOSPITAL_COMMUNITY): Payer: Medicare Other

## 2017-03-24 DIAGNOSIS — M79609 Pain in unspecified limb: Secondary | ICD-10-CM

## 2017-03-24 DIAGNOSIS — Z9889 Other specified postprocedural states: Secondary | ICD-10-CM

## 2017-03-24 LAB — BASIC METABOLIC PANEL
ANION GAP: 8 (ref 5–15)
BUN: 8 mg/dL (ref 6–20)
CHLORIDE: 102 mmol/L (ref 101–111)
CO2: 25 mmol/L (ref 22–32)
CREATININE: 1.03 mg/dL (ref 0.61–1.24)
Calcium: 8.5 mg/dL — ABNORMAL LOW (ref 8.9–10.3)
GFR calc Af Amer: >60 mL/min (ref >60)
GFR calc non Af Amer: >60 mL/min (ref >60)
Glucose, Bld: 121 mg/dL — ABNORMAL HIGH (ref 65–99)
POTASSIUM: 3.8 mmol/L (ref 3.5–5.1)
Sodium: 135 mmol/L (ref 135–145)

## 2017-03-24 LAB — CBC
HEMATOCRIT: 39.1 % (ref 39.0–52.0)
Hemoglobin: 13.4 g/dL (ref 13.0–17.0)
MCH: 30.7 pg (ref 26.0–34.0)
MCHC: 34.3 g/dL (ref 30.0–36.0)
MCV: 89.7 fL (ref 78.0–100.0)
PLATELETS: 170 10*3/uL (ref 150–400)
RBC: 4.36 MIL/uL (ref 4.22–5.81)
RDW: 12.3 % (ref 11.5–15.5)
WBC: 8.1 10*3/uL (ref 4.0–10.5)

## 2017-03-24 MED ORDER — OXYCODONE HCL 5 MG PO TABS: 5.0000 mg | ORAL_TABLET | ORAL | Status: DC | PRN

## 2017-03-24 MED ORDER — OXYCODONE HCL 5 MG PO TABS
5.0000 mg | ORAL_TABLET | ORAL | Status: DC | PRN
  Administered 2017-03-24 (×2): 5 mg via ORAL
  Administered 2017-03-24: 10 mg via ORAL
  Filled 2017-03-24: qty 2
  Filled 2017-03-24 (×2): qty 1

## 2017-03-24 MED ORDER — METHOCARBAMOL 500 MG PO TABS: 500.0000 mg | ORAL_TABLET | Freq: Four times a day (QID) | ORAL | 0 refills | Status: DC | PRN

## 2017-03-24 MED ORDER — OXYCODONE HCL 5 MG PO TABS: 5.0000 mg | ORAL_TABLET | ORAL | 0 refills | Status: DC | PRN

## 2017-03-24 MED ORDER — ACETAMINOPHEN 325 MG PO TABS: 650.0000 mg | ORAL_TABLET | Freq: Four times a day (QID) | ORAL | Status: DC | PRN

## 2017-03-24 MED ORDER — RIVAROXABAN 10 MG PO TABS: 10.0000 mg | ORAL_TABLET | Freq: Every day | ORAL | 0 refills | Status: DC

## 2017-03-24 NOTE — Progress Notes (Signed)
Left lower extremity venous duplex has been completed. Negative for DVT. Results were given to the patient's nurse, Rodman Key.  03/24/17 3:29 PM Carlos Levering RVT

## 2017-03-24 NOTE — Discharge Planning (Signed)
Patient IV removed.  RN assessment and VS revealed stability for DC to home with [facility] services.  Discharge papers given, explained and educated.  Informed of suggested FU appts and appts made. Scripts printed, signed and given.  Once ready, will be wheeled to front and family will be transporting home via car.

## 2017-03-24 NOTE — Discharge Summary (Signed)
[name], MD   Biagio Borg, PA-C [address], [place], [place]  [address]                             [phone]  PATIENT ID: Chad Flores        MRN:  [id number]          DOB/AGE: Feb 28, 1951 / 66 y.o.    DISCHARGE SUMMARY  ADMISSION DATE:    03/22/2017 DISCHARGE DATE:   03/24/2017   ADMISSION DIAGNOSIS: Osteoarthritis Left Knee    DISCHARGE DIAGNOSIS:  Osteoarthritis Left Knee    ADDITIONAL DIAGNOSIS: Principal Problem:   Primary osteoarthritis of left knee Active Problems:   S/P total knee replacement using cement, left  Past Medical History:  Diagnosis Date  . Anemia   . Anxiety   . COPD (chronic obstructive pulmonary disease) ([place])   . Osteoarthritis     PROCEDURE: Procedure(s): LEFT TOTAL KNEE ARTHROPLASTY  on 03/22/2017  CONSULTS: none    HISTORY: Patient is a 66 y.o. male presented with a history of pain in the left knee for 40 years. Onset of symptoms was gradual starting 40 years ago with gradually worsening course since that time. Prior procedures on the knee are meniscectomy x 3. Patient has been treated conservatively with over-the-counter NSAIDs and activity modification. Patient currently rates pain in the knee at 9 out of 10 with activity. There is pain at night. Present.  Chad Flores has a long history of left knee problems. He's had at least 2 knee arthroscopies over many years. He's has evidence of osteoarthritis by previous films. He's had cortisone and even over-the-counter anti-inflammatory medicines. He has now reached the point where he is having more pain to the point of compromise .   HOSPITAL COURSE:  Chad Flores is a 66 y.o. admitted on 03/22/2017 and found to have a diagnosis of Osteoarthritis Left Knee.  After appropriate laboratory studies were obtained  they were taken to the operating room on 03/22/2017 and underwent  Procedure(s): LEFT TOTAL KNEE ARTHROPLASTY  .   They were given perioperative antibiotics:    Anti-infectives (From admission, onward)   Start     Dose/Rate Route Frequency Ordered Stop   03/22/17 1400  vancomycin (VANCOCIN) IVPB 1000 mg/200 mL premix     1,000 mg 200 mL/hr over 60 Minutes Intravenous Every 12 hours 03/22/17 1121 03/23/17 0409   03/22/17 0606  ceFAZolin (ANCEF) IVPB 2g/100 mL premix     2 g 200 mL/hr over 30 Minutes Intravenous On call to O.R. 03/22/17 1017 03/22/17 0731    .  Tolerated the procedure well.   Toradol was given post op.  POD #1, allowed out of bed to a chair.  PT for ambulation and exercise program.   IV saline locked.  O2 discontionued.  POD #2, continued PT and ambulation.  Had calf pain on exam.  U/S ordered to R/O DVT and was negative  The remainder of the hospital course was dedicated to ambulation and strengthening.   The patient was discharged on 2 Days Post-Op in  Stable condition.  Blood products given:none  DIAGNOSTIC STUDIES: Recent vital signs:  Patient Vitals for the past 24 hrs:  BP Temp Temp src Pulse Resp SpO2  03/24/17 1455 118/61 99.1 F (37.3 C) Oral 97 16 95 %  03/24/17 0900 - - - - - 98 %  03/24/17 0320 (!) 114/57 97.9 F (36.6 C) Oral 100  17 98 %  03/23/17 2120 119/65 98.4 F (36.9 C) Oral 90 17 98 %  03/23/17 2038 - - - 91 18 100 %       Recent laboratory studies: Recent Labs    03/23/17 0417 03/24/17 0711  WBC 6.8 8.1  HGB 13.1 13.4  HCT 38.9* 39.1  PLT 148* 170   Recent Labs    03/23/17 0417 03/24/17 0711  NA 136 135  K 4.0 3.8  CL 105 102  CO2 28 25  BUN 12 8  CREATININE 1.15 1.03  GLUCOSE 136* 121*  CALCIUM 8.2* 8.5*   Lab Results  Component Value Date   INR 0.92 03/14/2017     Recent Radiographic Studies :  Dg Chest 2 View  Result Date: 03/14/2017 CLINICAL DATA:  Shortness of breath with activity. Preoperative evaluation for left knee surgery EXAM: CHEST  2 VIEW COMPARISON:  Chest CT July 29, 2016 FINDINGS: There is slight bibasilar atelectasis. There is a a 6 x 5 mm nodular type  opacity in left base. Lungs elsewhere are clear. Heart size and pulmonary vascular normal. No adenopathy. No evident bone lesions. IMPRESSION: 6 x 5 mm opacity left base, likely scar based on prior CT. A follow-up chest radiograph in 6 months to assess for stability would be reasonable given this circumstance. There is slight bibasilar atelectasis. No edema or consolidation. Cardiac silhouette within normal limits. Electronically Signed   By: Lowella Grip III M.D.   On: 03/14/2017 09:31    DISCHARGE INSTRUCTIONS: Discharge Instructions    CPM   Complete by:  As directed    Continuous passive motion machine (CPM):      Use the CPM from 0 to 60 degrees for 6-8 hours per day.      You may increase by 5-10 per day.  You may break it up into 2 or 3 sessions per day.      Use CPM for 3-4 weeks or until you are told to stop.   Call MD / Call 911   Complete by:  As directed    If you experience chest pain or shortness of breath, CALL 911 and be transported to the hospital emergency room.  If you develope a fever above 101 F, pus (white drainage) or increased drainage or redness at the wound, or calf pain, call your surgeon's office.   Change dressing   Complete by:  As directed    DO NOT CHANGE YOUR DRESSING.   Constipation Prevention   Complete by:  As directed    Drink plenty of fluids.  Prune juice may be helpful.  You may use a stool softener, such as Colace (over the counter) 100 mg twice a day.  Use MiraLax (over the counter) for constipation as needed.   Diet general   Complete by:  As directed    Discharge instructions   Complete by:  As directed    [place] items at home which could result in a fall. This includes throw rugs or furniture in walking pathways ICE to the affected joint every three hours while awake for 30 minutes at a time, for at least the first 3-5 days, and then as needed for pain and swelling.  Continue to use ice for pain and  swelling. You may notice swelling that will progress down to the foot and ankle.  This is normal after surgery.  Elevate your leg when you are not up walking on it.  Continue to use the breathing machine you got in the hospital (incentive spirometer) which will help keep your temperature down.  It is common for your temperature to cycle up and down following surgery, especially at night when you are not up moving around and exerting yourself.  The breathing machine keeps your lungs expanded and your temperature down.   DIET:  As you were doing prior to hospitalization, we recommend a well-balanced diet.  DRESSING / WOUND CARE / SHOWERING  Keep the surgical dressing until follow up.  The dressing is water proof, so you can shower without any extra covering.  IF THE DRESSING FALLS OFF or the wound gets wet inside, change the dressing with sterile gauze.  Please use good hand washing techniques before changing the dressing.  Do not use any lotions or creams on the incision until instructed by your surgeon.    ACTIVITY  Increase activity slowly as tolerated, but follow the weight bearing instructions below.   No driving for 6 weeks or until further direction given by your physician.  You cannot drive while taking narcotics.  No lifting or carrying greater than 10 lbs. until further directed by your surgeon. Avoid periods of inactivity such as sitting longer than an hour when not asleep. This helps prevent blood clots.  You may return to work once you are authorized by your doctor.     WEIGHT BEARING   Partial weight bearing with assist device as directed.  50%   EXERCISES  Results after joint replacement surgery are often greatly improved when you follow the exercise, range of motion and muscle strengthening exercises prescribed by your doctor. Safety measures are also important to protect the joint from further injury. Any time any of these exercises cause you to have increased pain or  swelling, decrease what you are doing until you are comfortable again and then slowly increase them. If you have problems or questions, call your caregiver or physical therapist for advice.   Rehabilitation is important following a joint replacement. After just a few days of immobilization, the muscles of the leg can become weakened and shrink (atrophy).  These exercises are designed to build up the tone and strength of the thigh and leg muscles and to improve motion. Often times heat used for twenty to thirty minutes before working out will loosen up your tissues and help with improving the range of motion but do not use heat for the first two weeks following surgery (sometimes heat can increase post-operative swelling).   These exercises can be done on a training (exercise) mat,  on a table or on a bed. Use whatever works the best and is most comfortable for you.    Use music or television while you are exercising so that the exercises are a pleasant break in your day. This will make your life better with the exercises acting as a break in your routine that you can look forward to.   Perform all exercises about fifteen times, three times per day or as directed.  You should exercise both the operative leg and the other leg as well.   Exercises include:  Quad Sets - Tighten up the muscle on the front of the thigh (Quad) and hold for 5-10 seconds.   Straight Leg Raises - With your knee straight (if you were given a brace, keep it on), lift the leg to 60 degrees, hold for 3 seconds, and slowly lower the leg.  Perform this exercise against resistance later as  your leg gets stronger.  Leg Slides: Lying on your back, slowly slide your foot toward your buttocks, bending your knee up off the floor (only go as far as is comfortable). Then slowly slide your foot back down until your leg is flat on the floor again.  Angel Wings: Lying on your back spread your legs to the side as far apart as you can without causing  discomfort.  Hamstring Strength:  Lying on your back, push your heel against the floor with your leg straight by tightening up the muscles of your buttocks.  Repeat, but this time bend your knee to a comfortable angle, and push your heel against the floor.  You may put a pillow under the heel to make it more comfortable if necessary.   A rehabilitation program following joint replacement surgery can speed recovery and prevent re-injury in the future due to weakened muscles. Contact your doctor or a physical therapist for more information on knee rehabilitation.    CONSTIPATION  Constipation is defined medically as fewer than three stools per week and severe constipation as less than one stool per week.  Even if you have a regular bowel pattern at home, your normal regimen is likely to be disrupted due to multiple reasons following surgery.  Combination of anesthesia, postoperative narcotics, change in appetite and fluid intake all can affect your bowels.   YOU MUST use at least one of the following options; they are listed in order of increasing strength to get the job done.  They are all available over the counter, and you may need to use some, POSSIBLY even all of these options:    Drink plenty of fluids (prune juice may be helpful) and high fiber foods Colace 100 mg by mouth twice a day  Senokot for constipation as directed and as needed Dulcolax (bisacodyl), take with full glass of water  Miralax (polyethylene glycol) once or twice a day as needed.  If you have tried all these things and are unable to have a bowel movement in the first 3-4 days after surgery call either your surgeon or your primary doctor.    If you experience loose stools or diarrhea, hold the medications until you stool forms back up.  If your symptoms do not get better within 1 week or if they get worse, check with your doctor.  If you experience "the worst abdominal pain ever" or develop nausea or vomiting, please contact  the office immediately for further recommendations for treatment.   ITCHING:  If you experience itching with your medications, try taking only a single pain pill, or even half a pain pill at a time.  You can also use Benadryl over the counter for itching or also to help with sleep.   TED HOSE STOCKINGS:  Use stockings on both legs until for at least 2 weeks or as directed by physician office. They may be removed at night for sleeping.  MEDICATIONS:  See your medication summary on the "After Visit Summary" that nursing will review with you.  You may have some home medications which will be placed on hold until you complete the course of blood thinner medication.  It is important for you to complete the blood thinner medication as prescribed.  PRECAUTIONS:  If you experience chest pain or shortness of breath - call 911 immediately for transfer to the hospital emergency department.   If you develop a fever greater that 101 F, purulent drainage from wound, increased redness or drainage from  wound, foul odor from the wound/dressing, or calf pain - CONTACT YOUR SURGEON.                                                   FOLLOW-UP APPOINTMENTS:  If you do not already have a post-op appointment, please call the office for an appointment to be seen by your surgeon.  Guidelines for how soon to be seen are listed in your "After Visit Summary", but are typically between 1-4 weeks after surgery.  OTHER INSTRUCTIONS:   Knee Replacement:  Do not place pillow under knee, focus on keeping the knee straight while resting. CPM instructions: 0-90 degrees, 2 hours in the morning, 2 hours in the afternoon, and 2 hours in the evening. Place foam block, curve side up under heel at all times except when in CPM or when walking.  DO NOT modify, tear, cut, or change the foam block in any way.  MAKE SURE YOU:  Understand these instructions.  Get help right away if you are not doing well or get worse.    Thank you for  letting us be a part of your medical care team.  It is a privilege we respect greatly.  We hope these instructions will help you stay on track for a fast and full recovery!   Do not put a pillow under the knee. Place it under the heel.   Complete by:  As directed    Driving restrictions   Complete by:  As directed    No driving for 6 weeks   Increase activity slowly as tolerated   Complete by:  As directed    Lifting restrictions   Complete by:  As directed    No lifting for 6 weeks   Partial weight bearing   Complete by:  As directed    % Body Weight:  50%   Laterality:  left   Extremity:  Lower   Patient may shower   Complete by:  As directed    You may shower over your dressing   TED hose   Complete by:  As directed    Use stockings (TED hose) for 3 weeks on left  leg.  You may remove them at night for sleeping.      DISCHARGE MEDICATIONS:   Allergies as of 03/24/2017   No Known Allergies     Medication List    STOP taking these medications   aspirin EC 81 MG tablet   diclofenac sodium 1 % Gel Commonly known as:  VOLTAREN   ibuprofen 200 MG tablet Commonly known as:  ADVIL,MOTRIN     TAKE these medications   acetaminophen 325 MG tablet Commonly known as:  TYLENOL Take 2 tablets (650 mg total) every 6 (six) hours as needed by mouth.   atorvastatin 10 MG tablet Commonly known as:  LIPITOR Take 10 mg by mouth daily.   buPROPion 150 MG 24 hr tablet Commonly known as:  WELLBUTRIN XL Take 150 mg by mouth in the morning   Fluticasone-Salmeterol 250-50 MCG/DOSE Aepb Commonly known as:  ADVAIR Inhale 1 puff into the lungs 2 (two) times daily.   methocarbamol 500 MG tablet Commonly known as:  ROBAXIN Take 1 tablet (500 mg total) every 6 (six) hours as needed by mouth for muscle spasms.   oxyCODONE 5 MG immediate release tablet Commonly  known as:  Oxy IR/ROXICODONE Take 1-2 tablets (5-10 mg total) every 4 (four) hours as needed by mouth for moderate pain or  severe pain.   rivaroxaban 10 MG Tabs tablet Commonly known as:  XARELTO Take 1 tablet (10 mg total) daily with breakfast by mouth. Start taking on:  03/25/2017   vitamin B-12 1000 MCG tablet Commonly known as:  CYANOCOBALAMIN Take 1,000 mcg by mouth daily.            Durable Medical Equipment  (From admission, onward)        Start     Ordered   03/22/17 1122  DME Walker rolling  Once    Question:  Patient needs a walker to treat with the following condition  Answer:  S/P total knee replacement using cement, right   03/22/17 1121   03/22/17 1122  DME 3 n 1  Once     03/22/17 1121   03/22/17 1122  DME Bedside commode  Once    Question:  Patient needs a bedside commode to treat with the following condition  Answer:  S/P total knee replacement using cement, right   03/22/17 1121       Discharge Care Instructions  (From admission, onward)        Start     Ordered   03/24/17 0000  Partial weight bearing    Question Answer Comment  % Body Weight 50%   Laterality left   Extremity Lower      03/24/17 1406   03/24/17 0000  Change dressing    Comments:  DO NOT CHANGE YOUR DRESSING.   03/24/17 1406      FOLLOW UP VISIT:   Follow-up Information    [facility] Follow up.   Specialty:  [place] Why:  A representative from [facility] will contact you to arrange start date and time for your therapy. Contact information: [address] [place] [address] [phone]        Garald Balding, MD. Schedule an appointment as soon as possible for a visit on 04/04/2017.   Specialty:  Orthopedic Surgery Contact information: [address] [place] [address] [phone]           DISPOSITION:   Home  CONDITION:  Stable   Mike Craze. [place], [place] [phone]  03/24/2017 4:20 PM

## 2017-03-24 NOTE — Progress Notes (Signed)
Physical Therapy Treatment Patient Details Name: Chad Flores MRN: [id number] DOB: 12-13-1950 Today's Date: 03/24/2017    History of Present Illness Pt is a 66 y/o male s/p elective L TKA. PMH includes COPD, smoker, R TKA, anemia, and trigger finger release.     PT Comments    Pt progressing towards physical therapy goals, however very limited by pain this session. Pt was able to tolerate minimal ambulation and therapeutic exercise. Positioned pt in chair at end of session with gel ice pack donned. Will continue to follow and progress as able per POC.    Follow Up Recommendations  DC plan and follow up therapy as arranged by surgeon;Supervision for mobility/OOB     Equipment Recommendations  Rolling walker with 5" wheels;3in1 (PT)    Recommendations for Other Services       Precautions / Restrictions Precautions Precautions: Knee Precaution Booklet Issued: Yes (comment) Precaution Comments: Pt was educated on NO roll/ice pack/pillow under knee.  Restrictions Weight Bearing Restrictions: Yes LLE Weight Bearing: Partial weight bearing LLE Partial Weight Bearing Percentage or Pounds: 50    Mobility  Bed Mobility Overal bed mobility: Needs Assistance Bed Mobility: Supine to Sit     Supine to sit: Supervision     General bed mobility comments: No assist required for pt to transition to EOB. Increased time.   Transfers Overall transfer level: Needs assistance Equipment used: Rolling walker (2 wheeled) Transfers: Sit to/from Stand Sit to Stand: Supervision         General transfer comment: Close supervision for safety. Pt was able to demonstrate proper hand placement on seated surface for safety.   Ambulation/Gait Ambulation/Gait assistance: Min guard Ambulation Distance (Feet): 150 Feet Assistive device: Rolling walker (2 wheeled) Gait Pattern/deviations: Step-to pattern;Decreased step length - right;Decreased step length - left;Decreased weight shift to  left;Antalgic;Step-through pattern Gait velocity: Decreased  Gait velocity interpretation: Below normal speed for age/gender General Gait Details: Significantly limited by pain this session. Reports attempting to keep as much weight off the knee as possible for pain tolerance.    Stairs            Wheelchair Mobility    Modified Rankin (Stroke Patients Only)       Balance Overall balance assessment: Needs assistance Sitting-balance support: No upper extremity supported;Feet supported Sitting balance-Leahy Scale: Good     Standing balance support: Bilateral upper extremity supported;During functional activity Standing balance-Leahy Scale: Fair Standing balance comment: Pt able to maintain static standing while washing hands at sink with close guard for safety; reliant on UE support during mobility                             Cognition Arousal/Alertness: Awake/alert Behavior During Therapy: WFL for tasks assessed/performed Overall Cognitive Status: Within Functional Limits for tasks assessed                                        Exercises Total Joint Exercises Heel Slides: 10 reps(stretching for ROM) Goniometric ROM: 60 AROM L knee in sitting    General Comments        Pertinent Vitals/Pain Pain Assessment: 0-10 Pain Score: 9 ("9 and 3/4") Pain Location: L knee  Pain Descriptors / Indicators: Aching;Operative site guarding Pain Intervention(s): Monitored during session;Other (comment)(Per RN unable to get any meds for pain at this time)  Home Living                      Prior Function            PT Goals (current goals can now be found in the care plan section) Acute Rehab PT Goals Patient Stated Goal: to go home  PT Goal Formulation: With patient Time For Goal Achievement: 03/29/17 Potential to Achieve Goals: Good Progress towards PT goals: Progressing toward goals    Frequency    7X/week      PT Plan  Current plan remains appropriate    Co-evaluation              AM-PAC PT "6 Clicks" Daily Activity  Outcome Measure  Difficulty turning over in bed (including adjusting bedclothes, sheets and blankets)?: None Difficulty moving from lying on back to sitting on the side of the bed? : None Difficulty sitting down on and standing up from a chair with arms (e.g., wheelchair, bedside commode, etc,.)?: A Little Help needed moving to and from a bed to chair (including a wheelchair)?: A Little Help needed walking in hospital room?: A Little Help needed climbing 3-5 steps with a railing? : A Little 6 Click Score: 20    End of Session Equipment Utilized During Treatment: Gait belt Activity Tolerance: Patient tolerated treatment well Patient left: in chair;with call bell/phone within reach Nurse Communication: Mobility status PT Visit Diagnosis: Other abnormalities of gait and mobility (R26.89);Pain Pain - Right/Left: Left Pain - part of body: Knee     Time: 0211-1552 PT Time Calculation (min) (ACUTE ONLY): 27 min  Charges:  $Gait Training: 23-37 mins                    G Codes:       [name], PT, DPT Acute Rehabilitation Services Pager: [phone]    [name] 03/24/2017, 9:37 AM

## 2017-03-24 NOTE — Progress Notes (Signed)
Physical Therapy Treatment Patient Details Name: Chad Flores MRN: [id number] DOB: 05/16/1950 Today's Date: 03/24/2017    History of Present Illness Pt is a 66 y/o male s/p elective L TKA. PMH includes COPD, smoker, R TKA, anemia, and trigger finger release.     PT Comments    Pt progressing towards physical therapy goals. Pt reports significant improvement in pain compared to morning session. Was able to tolerate increased ambulation distance and improvement in gait technique. Per PA, there is concern for new DVT and doppler has been ordered. PA gave this therapist the "okay" to proceed with session. Will continue to follow and progress as able per POC.   Follow Up Recommendations  DC plan and follow up therapy as arranged by surgeon;Supervision for mobility/OOB     Equipment Recommendations  Rolling walker with 5" wheels;3in1 (PT)    Recommendations for Other Services       Precautions / Restrictions Precautions Precautions: Knee Precaution Booklet Issued: Yes (comment) Precaution Comments: Pt was educated on NO roll/ice pack/pillow under knee.  Restrictions Weight Bearing Restrictions: Yes LLE Weight Bearing: Partial weight bearing LLE Partial Weight Bearing Percentage or Pounds: 50    Mobility  Bed Mobility Overal bed mobility: Needs Assistance Bed Mobility: Supine to Sit     Supine to sit: Supervision     General bed mobility comments: No assist required for pt to transition to EOB. Increased time.   Transfers Overall transfer level: Needs assistance Equipment used: Rolling walker (2 wheeled) Transfers: Sit to/from Stand Sit to Stand: Supervision         General transfer comment: Close supervision for safety. Pt was able to demonstrate proper hand placement on seated surface for safety.   Ambulation/Gait Ambulation/Gait assistance: Min guard Ambulation Distance (Feet): 250 Feet Assistive device: Rolling walker (2 wheeled) Gait Pattern/deviations:  Step-to pattern;Decreased step length - right;Decreased step length - left;Decreased weight shift to left;Antalgic;Step-through pattern Gait velocity: Decreased  Gait velocity interpretation: Below normal speed for age/gender General Gait Details: Significantly limited by pain this session. Reports attempting to keep as much weight off the knee as possible for pain tolerance.    Stairs            Wheelchair Mobility    Modified Rankin (Stroke Patients Only)       Balance Overall balance assessment: Needs assistance Sitting-balance support: No upper extremity supported;Feet supported Sitting balance-Leahy Scale: Good     Standing balance support: Bilateral upper extremity supported;During functional activity Standing balance-Leahy Scale: Fair                              Cognition Arousal/Alertness: Awake/alert Behavior During Therapy: WFL for tasks assessed/performed Overall Cognitive Status: Within Functional Limits for tasks assessed                                        Exercises Total Joint Exercises Quad Sets: 10 reps Straight Leg Raises: 5 reps Long Arc Quad: 10 reps    General Comments        Pertinent Vitals/Pain Pain Assessment: 0-10 Pain Score: 5  Pain Location: L knee  Pain Descriptors / Indicators: Aching;Operative site guarding Pain Intervention(s): Monitored during session;Ice applied;Repositioned    Home Living  Prior Function            PT Goals (current goals can now be found in the care plan section) Acute Rehab PT Goals Patient Stated Goal: to go home  PT Goal Formulation: With patient Time For Goal Achievement: 03/29/17 Potential to Achieve Goals: Good Progress towards PT goals: Progressing toward goals    Frequency    7X/week      PT Plan Current plan remains appropriate    Co-evaluation              AM-PAC PT "6 Clicks" Daily Activity  Outcome  Measure  Difficulty turning over in bed (including adjusting bedclothes, sheets and blankets)?: None Difficulty moving from lying on back to sitting on the side of the bed? : None Difficulty sitting down on and standing up from a chair with arms (e.g., wheelchair, bedside commode, etc,.)?: A Little Help needed moving to and from a bed to chair (including a wheelchair)?: A Little Help needed walking in hospital room?: A Little Help needed climbing 3-5 steps with a railing? : A Little 6 Click Score: 20    End of Session Equipment Utilized During Treatment: Gait belt Activity Tolerance: Patient tolerated treatment well Patient left: in chair;with call bell/phone within reach Nurse Communication: Mobility status PT Visit Diagnosis: Other abnormalities of gait and mobility (R26.89);Pain Pain - Right/Left: Left Pain - part of body: Knee     Time: 5364-6803 PT Time Calculation (min) (ACUTE ONLY): 39 min  Charges:  $Gait Training: 23-37 mins $Therapeutic Exercise: 8-22 mins                    G Codes:       [name], PT, DPT Acute Rehabilitation Services Pager: [phone]    [name] 03/24/2017, 3:11 PM

## 2017-03-24 NOTE — Progress Notes (Signed)
Subjective: 2 Days Post-Op Procedure(s) (LRB): LEFT TOTAL KNEE ARTHROPLASTY (Left) Patient reports pain as mild and moderate.    Objective: Vital signs in last 24 hours: Temp:  [97.9 F (36.6 C)-98.4 F (36.9 C)] 97.9 F (36.6 C) (11/15 0320) Pulse Rate:  [90-100] 100 (11/15 0320) Resp:  [17-18] 17 (11/15 0320) BP: (114-119)/(57-65) 114/57 (11/15 0320) SpO2:  [98 %-100 %] 98 % (11/15 0900)  Intake/Output from previous day: 11/14 0701 - 11/15 0700 In: 720 [P.O.:720] Out: 200 [Urine:200] Intake/Output this shift: Total I/O In: 240 [P.O.:240] Out: 350 [Urine:350]  Recent Labs    03/23/17 0417 03/24/17 0711  HGB 13.1 13.4   Recent Labs    03/23/17 0417 03/24/17 0711  WBC 6.8 8.1  RBC 4.27 4.36  HCT 38.9* 39.1  PLT 148* 170   Recent Labs    03/23/17 0417 03/24/17 0711  NA 136 135  K 4.0 3.8  CL 105 102  CO2 28 25  BUN 12 8  CREATININE 1.15 1.03  GLUCOSE 136* 121*  CALCIUM 8.2* 8.5*   No results for input(s): LABPT, INR in the last 72 hours.  Sensation intact distally Intact pulses distally Dorsiflexion/Plantar flexion intact Incision: no drainage  Tender calf posteriorly  Assessment/Plan: 2 Days Post-Op Procedure(s) (LRB): LEFT TOTAL KNEE ARTHROPLASTY (Left) Advance diet Up with therapy Discharge home with home health  Doppler to R/O DVT stat-  If NEG, will discharge home  Biagio Borg 03/24/2017, 1:59 PM

## 2017-03-25 DIAGNOSIS — J449 Chronic obstructive pulmonary disease, unspecified: Secondary | ICD-10-CM | POA: Diagnosis not present

## 2017-03-25 DIAGNOSIS — Z471 Aftercare following joint replacement surgery: Secondary | ICD-10-CM | POA: Diagnosis not present

## 2017-03-25 DIAGNOSIS — F17200 Nicotine dependence, unspecified, uncomplicated: Secondary | ICD-10-CM | POA: Diagnosis not present

## 2017-03-25 DIAGNOSIS — Z96653 Presence of artificial knee joint, bilateral: Secondary | ICD-10-CM | POA: Diagnosis not present

## 2017-03-29 DIAGNOSIS — Z96653 Presence of artificial knee joint, bilateral: Secondary | ICD-10-CM | POA: Diagnosis not present

## 2017-03-29 DIAGNOSIS — F17200 Nicotine dependence, unspecified, uncomplicated: Secondary | ICD-10-CM | POA: Diagnosis not present

## 2017-03-29 DIAGNOSIS — J449 Chronic obstructive pulmonary disease, unspecified: Secondary | ICD-10-CM | POA: Diagnosis not present

## 2017-03-29 DIAGNOSIS — Z471 Aftercare following joint replacement surgery: Secondary | ICD-10-CM | POA: Diagnosis not present

## 2017-03-30 ENCOUNTER — Telehealth (INDEPENDENT_AMBULATORY_CARE_PROVIDER_SITE_OTHER): Payer: Self-pay

## 2017-03-30 DIAGNOSIS — Z96653 Presence of artificial knee joint, bilateral: Secondary | ICD-10-CM | POA: Diagnosis not present

## 2017-03-30 DIAGNOSIS — J449 Chronic obstructive pulmonary disease, unspecified: Secondary | ICD-10-CM | POA: Diagnosis not present

## 2017-03-30 DIAGNOSIS — Z471 Aftercare following joint replacement surgery: Secondary | ICD-10-CM | POA: Diagnosis not present

## 2017-03-30 DIAGNOSIS — F17200 Nicotine dependence, unspecified, uncomplicated: Secondary | ICD-10-CM | POA: Diagnosis not present

## 2017-03-30 NOTE — Telephone Encounter (Signed)
Please advise 

## 2017-03-30 NOTE — Telephone Encounter (Signed)
Patient would like a call back concerning some questions/concerns from his surgery last Tuesday. Stated that he is still having pain in the back of left calf. Cb# is [phone].  Please advise.  Thank You.

## 2017-04-01 DIAGNOSIS — Z96653 Presence of artificial knee joint, bilateral: Secondary | ICD-10-CM | POA: Diagnosis not present

## 2017-04-01 DIAGNOSIS — J449 Chronic obstructive pulmonary disease, unspecified: Secondary | ICD-10-CM | POA: Diagnosis not present

## 2017-04-01 DIAGNOSIS — F17200 Nicotine dependence, unspecified, uncomplicated: Secondary | ICD-10-CM | POA: Diagnosis not present

## 2017-04-01 DIAGNOSIS — Z471 Aftercare following joint replacement surgery: Secondary | ICD-10-CM | POA: Diagnosis not present

## 2017-04-04 ENCOUNTER — Ambulatory Visit (INDEPENDENT_AMBULATORY_CARE_PROVIDER_SITE_OTHER): Payer: Medicare Other | Admitting: Orthopaedic Surgery

## 2017-04-04 ENCOUNTER — Ambulatory Visit (INDEPENDENT_AMBULATORY_CARE_PROVIDER_SITE_OTHER): Payer: Medicare Other

## 2017-04-04 ENCOUNTER — Telehealth (INDEPENDENT_AMBULATORY_CARE_PROVIDER_SITE_OTHER): Payer: Self-pay | Admitting: Orthopaedic Surgery

## 2017-04-04 ENCOUNTER — Other Ambulatory Visit (INDEPENDENT_AMBULATORY_CARE_PROVIDER_SITE_OTHER): Payer: Self-pay | Admitting: *Deleted

## 2017-04-04 ENCOUNTER — Encounter (INDEPENDENT_AMBULATORY_CARE_PROVIDER_SITE_OTHER): Payer: Self-pay | Admitting: Orthopaedic Surgery

## 2017-04-04 VITALS — BP 95/60 | HR 85 | Resp 14 | Ht 66.0 in | Wt 185.0 lb

## 2017-04-04 DIAGNOSIS — Z96652 Presence of left artificial knee joint: Secondary | ICD-10-CM

## 2017-04-04 NOTE — Telephone Encounter (Signed)
Would like to know if patient needs to be dc'd by a certain day this week, so he can start out patient PT. Please call to advise.

## 2017-04-04 NOTE — Progress Notes (Signed)
Office Visit Note   Patient: Chad Flores           Date of Birth: [birth year]           MRN: [id number] Visit Date: 04/04/2017              Requested by: Darcus Austin, MD [address] [place], [place] [address] PCP: Darcus Austin, MD   Assessment & Plan: Visit Diagnoses:  1. Presence of left artificial knee joint     Plan: 2 weeks post left total knee replacement doing well. No fever or chills. Doppler study was negative postop for DVT. Still having some mild calf pain and swelling. Using a walker and plans to start outpatient therapy some time this week. Staples removed and Steri-Strips applied. Arrange outpatient PT and check back in 2 weeks. Stop xarelto  Follow-Up Instructions: Return in about 2 weeks (around 04/18/2017).   Orders:  Orders Placed This Encounter  Procedures  . XR KNEE 3 VIEW LEFT   No orders of the defined types were placed in this encounter.     Procedures: No procedures performed   Clinical Data: No additional findings.   Subjective: Chief Complaint  Patient presents with  . Left Knee - Routine Post Op    Chad Flores is  A 66 y o S/P 2 weeks Left total knee replacement.Staples removed and steri strips applied. Ambulates with a walker.    HPI  Review of Systems  Constitutional: Negative for fatigue.  HENT: Negative for hearing loss.   Respiratory: Negative for apnea, chest tightness and shortness of breath.   Cardiovascular: Negative for chest pain, palpitations and leg swelling.  Gastrointestinal: Negative for blood in stool, constipation and diarrhea.  Genitourinary: Negative for difficulty urinating.  Musculoskeletal: Negative for arthralgias, back pain, joint swelling, myalgias, neck pain and neck stiffness.  Neurological: Negative for weakness, numbness and headaches.  Hematological: Does not bruise/bleed easily.  Psychiatric/Behavioral: Positive for sleep disturbance. The patient is not nervous/anxious.       Objective: Vital Signs: BP 95/60   Pulse 85   Resp 14   Ht 5\' 6"  (1.676 m)   Wt 185 lb (83.9 kg)   BMI 29.86 kg/m   Physical Exam  Ortho Exam awake alert and oriented 3. Comfortable sitting. Just about full left knee extension and flexion about 90. Still some calf pain and swelling. Minimal ankle edema. Neurovascular exam intact. No instability. Wound healing without problem. Clips removed and Steri-Strips applied.  Specialty Comments:  No specialty comments available.  Imaging: Xr Knee 3 View Left  Result Date: 04/04/2017 Films of the left total knee replacement were obtained in 3 projections. There is excellent alignment of the components without complicating factors. Slight lateral tilt of the patella but not symptomatic. I used one metallic anchor for a partial avulsion of the patella tendon in good position    PMFS History: Patient Active Problem List   Diagnosis Date Noted  . Primary osteoarthritis of left knee 03/22/2017  . S/P total knee replacement using cement, left 03/22/2017   Past Medical History:  Diagnosis Date  . Anemia   . Anxiety   . COPD (chronic obstructive pulmonary disease) ([place])   . Osteoarthritis     Family History  Problem Relation Age of Onset  . Alzheimer's disease Mother   . Cancer Father     Past Surgical History:  Procedure Laterality Date  . CHOLECYSTECTOMY    . ELBOW SURGERY    .  KNEE ARTHROPLASTY    . KNEE SURGERY    . TOTAL KNEE ARTHROPLASTY Left 03/22/2017  . TOTAL KNEE ARTHROPLASTY Left 03/22/2017   Procedure: LEFT TOTAL KNEE ARTHROPLASTY;  Surgeon: Garald Balding, MD;  Location: [facility];  Service: Orthopedics;  Laterality: Left;  . TRIGGER FINGER RELEASE     Social History   Occupational History  . Not on file  Tobacco Use  . Smoking status: Current Every Day Smoker    Packs/day: 1.00    Years: 52.50    Pack years: 52.50    Types: Cigarettes  . Smokeless tobacco: Never Used  . Tobacco comment: Now uses e  cigarettes. Encouraged to quit smoking completely.  Substance and Sexual Activity  . Alcohol use: Yes    Alcohol/week: 0.6 oz    Types: 1 Standard drinks or equivalent per week    Comment: occ  . Drug use: No  . Sexual activity: Not on file

## 2017-04-05 DIAGNOSIS — Z471 Aftercare following joint replacement surgery: Secondary | ICD-10-CM | POA: Diagnosis not present

## 2017-04-05 DIAGNOSIS — Z96653 Presence of artificial knee joint, bilateral: Secondary | ICD-10-CM | POA: Diagnosis not present

## 2017-04-05 DIAGNOSIS — J449 Chronic obstructive pulmonary disease, unspecified: Secondary | ICD-10-CM | POA: Diagnosis not present

## 2017-04-05 DIAGNOSIS — F17200 Nicotine dependence, unspecified, uncomplicated: Secondary | ICD-10-CM | POA: Diagnosis not present

## 2017-04-05 NOTE — Telephone Encounter (Signed)
Called patient from an old message in my inbox . that was not answered. Just spoke with patient and her no calf pain, no fever or chills and very happy.

## 2017-04-05 NOTE — Telephone Encounter (Signed)
Called pt and delivered message from Aaron Edelman to come in for  visit

## 2017-04-05 NOTE — Telephone Encounter (Signed)
Needs to come in to be evaluated.  This could be normal or he could possibly be having a blood clot

## 2017-04-05 NOTE — Telephone Encounter (Signed)
Please advise 

## 2017-04-06 ENCOUNTER — Ambulatory Visit (INDEPENDENT_AMBULATORY_CARE_PROVIDER_SITE_OTHER): Payer: Medicare Other | Admitting: Orthopedic Surgery

## 2017-04-07 ENCOUNTER — Ambulatory Visit: Payer: Medicare Other | Attending: Family Medicine

## 2017-04-07 DIAGNOSIS — M25562 Pain in left knee: Secondary | ICD-10-CM | POA: Insufficient documentation

## 2017-04-07 DIAGNOSIS — M25662 Stiffness of left knee, not elsewhere classified: Secondary | ICD-10-CM | POA: Insufficient documentation

## 2017-04-07 DIAGNOSIS — R600 Localized edema: Secondary | ICD-10-CM | POA: Insufficient documentation

## 2017-04-07 DIAGNOSIS — R2689 Other abnormalities of gait and mobility: Secondary | ICD-10-CM

## 2017-04-07 DIAGNOSIS — M6281 Muscle weakness (generalized): Secondary | ICD-10-CM | POA: Diagnosis not present

## 2017-04-07 NOTE — Therapy (Signed)
[facility] [address] [place], [place], [address] Phone: [phone]   Fax:  [phone]  Physical Therapy Evaluation  Patient Details  Name: Chad Flores MRN: [id number] Date of Birth: 1950/12/08 Referring Provider: [name], MD   Encounter Date: 04/07/2017  PT End of Session - 04/07/17 1223    Visit Number  1    Number of Visits  10    Date for PT Re-Evaluation  06/02/17    Authorization Type  Medicare/Tricare    PT Start Time  1145    PT Stop Time  1235    PT Time Calculation (min)  50 min    Activity Tolerance  Patient tolerated treatment well    Behavior During Therapy  [facility] for tasks assessed/performed       Past Medical History:  Diagnosis Date  . Anemia   . Anxiety   . COPD (chronic obstructive pulmonary disease) ([place])   . Osteoarthritis     Past Surgical History:  Procedure Laterality Date  . CHOLECYSTECTOMY    . ELBOW SURGERY    . KNEE ARTHROPLASTY    . KNEE SURGERY    . TOTAL KNEE ARTHROPLASTY Left 03/22/2017  . TOTAL KNEE ARTHROPLASTY Left 03/22/2017   Procedure: LEFT TOTAL KNEE ARTHROPLASTY;  Surgeon: Garald Balding, MD;  Location: [facility];  Service: Orthopedics;  Laterality: Left;  . TRIGGER FINGER RELEASE      There were no vitals filed for this visit.   Subjective Assessment - 04/07/17 1151    Subjective  Pt presents to PT 2 weeks s/p Lt TKA due to OA.  Pt had home health PT that ended yesterday.  Pt is using a rolling walker and reports that MD wants 50% weightbearing for now.      Pertinent History  Lt TKA: 03/22/17    Limitations  Standing;Walking    How long can you stand comfortably?  Rt>Lt weightbearing    How long can you walk comfortably?  15 minutes with fatigue.  Using walker and 50% WB on the Lt    Patient Stated Goals  improve gait, reduce pain, improve Lt knee A/ROM, endurance    Currently in Pain?  Yes    Pain Score  6     Pain Orientation  Left    Pain Descriptors / Indicators  Aching;Burning    Pain Type  Surgical pain    Pain Onset  1 to 4 weeks ago    Aggravating Factors   CPM machine, getting up from sitting, walking too long    Pain Relieving Factors  ice, rest, elevation         [facility] PT Assessment - 04/07/17 0001      Assessment   Medical Diagnosis  s/p Lt TKA    Referring Provider  [name], MD    Onset Date/Surgical Date  03/22/17    Next MD Visit  04/18/17    Prior Therapy  home health PT until 04/06/17      Precautions   Precautions  None      Restrictions   Weight Bearing Restrictions  Yes    Other Position/Activity Restrictions  Pt reports 50% WB on Lt LE per MD      Balance Screen   Has the patient fallen in the past 6 months  No    Has the patient had a decrease in activity level because of a fear of falling?   No    Is  the patient reluctant to leave their home because of a fear of falling?   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      Cognition   Overall Cognitive Status  Within Functional Limits for tasks assessed      Observation/Other Assessments   Focus on Therapeutic Outcomes (FOTO)   53% limitation       Observation/Other Assessments-Edema    Edema  Circumferential      Circumferential Edema   Circumferential - Right  15.25 inches    Circumferential - Left   17.5 inches      Posture/Postural Control   Posture/Postural Control  No significant limitations      ROM / Strength   AROM / PROM / Strength  AROM;PROM;Strength      AROM   Overall AROM   Deficits    Overall AROM Comments  Rt knee A/ROM is full    AROM  Assessment Site  Knee    Right/Left Knee  Left    Left Knee Extension  5    Left Knee Flexion  65      PROM   Overall PROM   Deficits    PROM Assessment Site  Knee    Right/Left Knee  Left    Left Knee Extension  3    Left Knee Flexion  75      Strength   Overall Strength  Deficits    Strength Assessment Site  Knee;Hip    Right/Left Hip  Right;Left    Right Hip Flexion  4+/5    Right Hip External Rotation   4+/5    Right Hip Internal Rotation  4+/5    Left Hip Flexion  4/5    Left Hip External Rotation  4/5    Left Hip Internal Rotation  4/5    Right/Left Knee  Right;Left    Right Knee Flexion  5/5    Right Knee Extension  5/5    Left Knee Flexion  3+/5    Left Knee Extension  3+/5 lag      Palpation   Patella mobility  reduced patellar mobility in all directions due to edema    Palpation comment  incision 8 inches: steristrips and scabbing present.  Reduced mobility and healing.        Transfers   Transfers  Sit to Stand;Stand to Sit    Sit to Stand  6: Modified independent (Device/Increase time);With upper extremity assist    Stand to Sit  6: Modified independent (Device/Increase time);With upper extremity assist      Ambulation/Gait   Ambulation/Gait  Yes    Assistive device  Rolling walker    Gait Pattern  Step-to pattern;Decreased stance time - left 50% weightbearing on the Lt per MD             Objective measurements completed on examination: See above findings.      [facility] Adult PT Treatment/Exercise - 04/07/17 0001      Exercises  Exercises  Knee/Hip      Knee/Hip Exercises: Aerobic   Nustep  Seat 11-9 (moved closer with time) Level 1x 8 minutes      Modalities   Modalities  Vasopneumatic      Vasopneumatic   Number Minutes Vasopneumatic   15 minutes    Vasopnuematic Location   Knee    Vasopneumatic Pressure  Medium    Vasopneumatic Temperature   15 minutes               PT Short Term Goals - 04/07/17 1225      PT SHORT TERM  GOAL #1   Title  be independent in initial HEP    Time  4    Period  Weeks    Status  New    Target Date  05/05/17      PT SHORT TERM GOAL #2   Title  ambulate with full weight bearing on the Lt and use cane for household distances    Time  4    Period  Weeks    Status  New    Target Date  05/05/17      PT SHORT TERM GOAL #3   Title  demonstrate Lt knee A/ROM flexion to > or = to 95 degrees to allow for sitting without substitution    Time  4    Period  Weeks    Status  New    Target Date  05/05/17      PT SHORT TERM GOAL #4   Title  demonstrate 4/5 Lt knee strength to improve endurance and improve sit to stand    Time  4    Period  Weeks    Status  New    Target Date  05/05/17        PT Long Term Goals - 04/07/17 1227      PT LONG TERM GOAL #1   Title  be independent in advanced HEP    Time  8    Period  Weeks    Status  New    Target Date  06/02/17      PT LONG TERM GOAL #2   Title  improve Lt LE strength to > or = to 4+/5 to allow for ascending steps with step-over step gait and perform sit to stand without UE support    Time  8    Period  Weeks    Status  New    Target Date  06/02/17      PT LONG TERM GOAL #3   Title  reduce FOTO to < or = to 39% limitation    Time  8    Period  Weeks    Status  New    Target Date  06/02/17      PT LONG TERM GOAL #4   Title  demonstrate Lt knee A/ROM flexion to > or = to 115 degrees to allow for squatting     Time  8    Period  Weeks    Status  New    Target Date  06/02/17      PT LONG TERM GOAL #5   Title  wean from cane > or = to 50% of the time in the community and demonstrate symmetry on level surfaces    Time  8    Period  Weeks    Status  New    Target Date  06/02/17      Additional Long Term Goals  Additional Long Term Goals  Yes      PT LONG TERM GOAL #6   Title  report < or = to 3/10 Lt knee pain with standing and walking to improve independence with community and home function    Time  8     Period  Weeks    Status  New    Target Date  06/02/17             Plan - 04/07/17 1215    Clinical Impression Statement  Pt presents to PT 2 weeks s/p Lt TKA performed 03/22/17 due to OA.  Pt reports that MD would like him to remain 50% WB on the Lt until next MD appointment.  Pt ambulates with rolling walker with step to gait and 50% WB.  Pt demosntrates 3-75 degrees Lt knee PROM, 2.25 inches of mid patellar edema vs the Rt, quad lag with extension and decreased Lt hip and knee strength.  Pt with healing surgical incision with reduced mobility.  Pt will benefit from skilled PT for edema and pain management, Lt knee A/ROM, LE strength and gait training.      History and Personal Factors relevant to plan of care:  multiple Lt knee surgeries    Clinical Presentation  Stable    Clinical Presentation due to:  predictable progression s/p TKA    Clinical Decision Making  Low    Rehab Potential  Good    PT Frequency  3x / week    PT Duration  8 weeks    PT Treatment/Interventions  ADLs/Self Care Home Management;Cryotherapy;Electrical Stimulation;Moist Heat;Gait training;Stair training;Functional mobility training;Therapeutic activities;Therapeutic exercise;Patient/family education;Neuromuscular re-education;Scar mobilization;Passive range of motion;Vasopneumatic Device;Taping    PT Next Visit Plan  Edema management, teach scar mobilization, manual to distal quad and hamstring, gait, flexibility    Consulted and Agree with Plan of Care  Patient       Patient will benefit from skilled therapeutic intervention in order to improve the following deficits and impairments:  Abnormal gait, Pain, Decreased mobility, Decreased scar mobility, Decreased strength, Decreased endurance, Decreased activity tolerance, Decreased range of motion, Difficulty walking, Increased edema, Impaired flexibility  Visit Diagnosis: Localized edema - Plan: PT plan of care cert/re-cert  Stiffness of left knee, not  elsewhere classified - Plan: PT plan of care cert/re-cert  Acute pain of left knee - Plan: PT plan of care cert/re-cert  Muscle weakness (generalized) - Plan: PT plan of care cert/re-cert  Other abnormalities of gait and mobility - Plan: PT plan of care cert/re-cert  G-Codes - 93/81/01 1214    Functional Assessment Tool Used (Outpatient Only)  FOTO: 53% limitation    Functional Limitation  Mobility: Walking and moving around    Mobility: Walking and Moving Around Current Status (B5102)  At least 40 percent but less than 60 percent impaired, limited or restricted    Mobility: Walking and Moving Around Goal Status [phone])  At least 20 percent but less than 40 percent impaired, limited or restricted        Problem List Patient Active Problem List   Diagnosis Date Noted  . Primary osteoarthritis of left knee 03/22/2017  . S/P total knee replacement using cement, left 03/22/2017    Sigurd Sos, PT 04/07/17 12:32 PM  [facility] [address] [place], [place], [address] Phone: [phone]   Fax:  [phone]  Name: CHRIST FULLENWIDER MRN: [id number] Date of Birth: 07-04-1950

## 2017-04-08 ENCOUNTER — Telehealth: Payer: Self-pay

## 2017-04-08 NOTE — Telephone Encounter (Signed)
Please advise 

## 2017-04-08 NOTE — Telephone Encounter (Signed)
Called-OK to [facility]

## 2017-04-08 NOTE — Telephone Encounter (Signed)
Patient called stating that he went to his first PT visit and physical therapist would like to know why patient's weight limit is still at 50%?  Would like to know if weight limit will change?  CB# is [phone].  Please advise.  Thank you.

## 2017-04-11 ENCOUNTER — Ambulatory Visit: Payer: Medicare Other

## 2017-04-13 ENCOUNTER — Ambulatory Visit: Payer: Medicare Other | Attending: Family Medicine

## 2017-04-13 DIAGNOSIS — M25562 Pain in left knee: Secondary | ICD-10-CM | POA: Diagnosis not present

## 2017-04-13 DIAGNOSIS — M25662 Stiffness of left knee, not elsewhere classified: Secondary | ICD-10-CM | POA: Diagnosis not present

## 2017-04-13 DIAGNOSIS — R600 Localized edema: Secondary | ICD-10-CM | POA: Diagnosis not present

## 2017-04-13 DIAGNOSIS — M6281 Muscle weakness (generalized): Secondary | ICD-10-CM | POA: Insufficient documentation

## 2017-04-13 DIAGNOSIS — R2689 Other abnormalities of gait and mobility: Secondary | ICD-10-CM | POA: Diagnosis not present

## 2017-04-13 NOTE — Therapy (Signed)
[facility] [address], [place], [place], [address] Phone: [phone]   Fax:  [phone]  Physical Therapy Treatment  Patient Details  Name: Chad Flores MRN: [id number] Date of Birth: Nov 07, 1950 Referring Provider: [name], MD   Encounter Date: 04/13/2017  PT End of Session - 04/13/17 1057    Visit Number  2    Number of Visits  10    Date for PT Re-Evaluation  06/02/17    Authorization Type  Medicare/Tricare    PT Start Time  1015    PT Stop Time  1112    PT Time Calculation (min)  57 min    Activity Tolerance  Patient tolerated treatment well    Behavior During Therapy  [facility] for tasks assessed/performed       Past Medical History:  Diagnosis Date  . Anemia   . Anxiety   . COPD (chronic obstructive pulmonary disease) ([place])   . Osteoarthritis     Past Surgical History:  Procedure Laterality Date  . CHOLECYSTECTOMY    . ELBOW SURGERY    . KNEE ARTHROPLASTY    . KNEE SURGERY    . TOTAL KNEE ARTHROPLASTY Left 03/22/2017  . TOTAL KNEE ARTHROPLASTY Left 03/22/2017   Procedure: LEFT TOTAL KNEE ARTHROPLASTY;  Surgeon: Garald Balding, MD;  Location: [facility];  Service: Orthopedics;  Laterality: Left;  . TRIGGER FINGER RELEASE      There were no vitals filed for this visit.  Subjective Assessment - 04/13/17 1023    Subjective  I'm doing OK.  My knee is stiff.      Pertinent History  Lt TKA: 03/22/17    Currently in Pain?  Yes    Pain Score  2     Pain Location  Knee    Pain Orientation  Left    Pain Descriptors / Indicators  Aching;Burning    Pain Type  Surgical pain    Pain Onset  1 to 4 weeks ago    Pain Frequency  Constant    Aggravating Factors   bending the knee, getting up from sitting, walking too long    Pain Relieving Factors  ice, rest, elevation                      [facility] Adult PT Treatment/Exercise - 04/13/17 0001      Ambulation/Gait   Ambulation/Gait   Yes    Assistive device  Straight cane    Gait Pattern  Step-through pattern;Decreased step length - left;Decreased stance time - left      Knee/Hip Exercises: Stretches   Active Hamstring Stretch  Left;3 reps;20 seconds      Knee/Hip Exercises: Aerobic   Stationary Bike  Level 0 for A/ROM x 8 minutes PT present to discuss progress      Knee/Hip Exercises: Standing   Rocker Board  3 minutes    Rebounder  weight shifting 3 ways x1 minute each      Knee/Hip Exercises: Seated   Long Arc Quad  Strengthening;Left;10 reps    Heel Slides  AAROM;Left;10 reps      Knee/Hip Exercises: Supine   Short Arc Quad Sets  Strengthening;Left;2 sets;10 reps      Modalities   Modalities  Vasopneumatic      Vasopneumatic   Number Minutes Vasopneumatic   15 minutes    Vasopnuematic Location   Knee    Vasopneumatic Pressure  Medium    Vasopneumatic Temperature  15 minutes      Manual Therapy   Manual Therapy  Edema management;Soft tissue mobilization;Myofascial release    Manual therapy comments  soft tissue elongation to distal quad and hamstring and proximal gastroc on the Lt.  mobilization of tissue around incision               PT Short Term Goals - 04/13/17 1024      PT SHORT TERM GOAL #1   Title  be independent in initial HEP    Time  4    Period  Weeks    Status  On-going      PT SHORT TERM GOAL #2   Title  ambulate with full weight bearing on the Lt and use cane for household distances    Baseline  using cane now    Time  4    Period  Weeks    Status  On-going        PT Long Term Goals - 04/07/17 1227      PT LONG TERM GOAL #1   Title  be independent in advanced HEP    Time  8    Period  Weeks    Status  New    Target Date  06/02/17      PT LONG TERM GOAL #2   Title  improve Lt LE strength to > or = to 4+/5 to allow for ascending steps with step-over step gait and perform sit to stand without UE support    Time  8    Period  Weeks    Status  New    Target  Date  06/02/17      PT LONG TERM GOAL #3   Title  reduce FOTO to < or = to 39% limitation    Time  8    Period  Weeks    Status  New    Target Date  06/02/17      PT LONG TERM GOAL #4   Title  demonstrate Lt knee A/ROM flexion to > or = to 115 degrees to allow for squatting     Time  8    Period  Weeks    Status  New    Target Date  06/02/17      PT LONG TERM GOAL #5   Title  wean from cane > or = to 50% of the time in the community and demonstrate symmetry on level surfaces    Time  8    Period  Weeks    Status  New    Target Date  06/02/17      Additional Long Term Goals   Additional Long Term Goals  Yes      PT LONG TERM GOAL #6   Title  report < or = to 3/10 Lt knee pain with standing and walking to improve independence with community and home function    Time  8    Period  Weeks    Status  New    Target Date  06/02/17            Plan - 04/13/17 1034    Clinical Impression Statement  Pt is ambulating with a cane today.  Pt is working on [employer] knee A/ROM, strength and gait s/p TKA.  Pt with edema, limited A/ROM and strength that is exprected s/p TKA.  Pt with improved quad control and full A/ROM with long arc quads.  Pt will continue to benefit from skilled PT  for edema management, strength, gait and A/ROM progression.      Rehab Potential  Good    PT Frequency  3x / week    PT Duration  8 weeks    PT Treatment/Interventions  ADLs/Self Care Home Management;Cryotherapy;Electrical Stimulation;Moist Heat;Gait training;Stair training;Functional mobility training;Therapeutic activities;Therapeutic exercise;Patient/family education;Neuromuscular re-education;Scar mobilization;Passive range of motion;Vasopneumatic Device;Taping    PT Next Visit Plan  Edema management, teach scar mobilization, manual to distal quad and hamstring, gait, flexibility    Recommended Other Services  initial certification is signed    Consulted and Agree with Plan of Care  Patient       Patient  will benefit from skilled therapeutic intervention in order to improve the following deficits and impairments:  Abnormal gait, Pain, Decreased mobility, Decreased scar mobility, Decreased strength, Decreased endurance, Decreased activity tolerance, Decreased range of motion, Difficulty walking, Increased edema, Impaired flexibility  Visit Diagnosis: Localized edema  Stiffness of left knee, not elsewhere classified  Acute pain of left knee  Muscle weakness (generalized)  Other abnormalities of gait and mobility     Problem List Patient Active Problem List   Diagnosis Date Noted  . Primary osteoarthritis of left knee 03/22/2017  . S/P total knee replacement using cement, left 03/22/2017     Sigurd Sos, PT 04/13/17 10:59 AM  [facility] [address], [place], [place], [address] Phone: [phone]   Fax:  [phone]  Name: ZORIAN GUNDERMAN MRN: [id number] Date of Birth: 10/11/1950

## 2017-04-15 ENCOUNTER — Ambulatory Visit: Payer: Medicare Other | Admitting: Physical Therapy

## 2017-04-15 DIAGNOSIS — M6281 Muscle weakness (generalized): Secondary | ICD-10-CM

## 2017-04-15 DIAGNOSIS — M25662 Stiffness of left knee, not elsewhere classified: Secondary | ICD-10-CM

## 2017-04-15 DIAGNOSIS — R2689 Other abnormalities of gait and mobility: Secondary | ICD-10-CM

## 2017-04-15 DIAGNOSIS — M25562 Pain in left knee: Secondary | ICD-10-CM | POA: Diagnosis not present

## 2017-04-15 DIAGNOSIS — R600 Localized edema: Secondary | ICD-10-CM

## 2017-04-15 NOTE — Therapy (Signed)
[facility] [address] [place], [place], [address] Phone: [phone]   Fax:  [phone]  Physical Therapy Treatment  Patient Details  Name: Chad Flores MRN: [id number] Date of Birth: 08-Dec-1950 Referring Provider: [name], MD   Encounter Date: 04/15/2017  PT End of Session - 04/15/17 1003    Visit Number  3    Number of Visits  10    Date for PT Re-Evaluation  06/02/17    Authorization Type  Medicare/Tricare    PT Start Time  [phone] pt arrived early and did Nu-Step unsupervised    PT Stop Time  1015    PT Time Calculation (min)  57 min    Activity Tolerance  Patient tolerated treatment well       Past Medical History:  Diagnosis Date  . Anemia   . Anxiety   . COPD (chronic obstructive pulmonary disease) ([place])   . Osteoarthritis     Past Surgical History:  Procedure Laterality Date  . CHOLECYSTECTOMY    . ELBOW SURGERY    . KNEE ARTHROPLASTY    . KNEE SURGERY    . TOTAL KNEE ARTHROPLASTY Left 03/22/2017  . TOTAL KNEE ARTHROPLASTY Left 03/22/2017   Procedure: LEFT TOTAL KNEE ARTHROPLASTY;  Surgeon: Garald Balding, MD;  Location: [facility];  Service: Orthopedics;  Laterality: Left;  . TRIGGER FINGER RELEASE      There were no vitals filed for this visit.  Subjective Assessment - 04/15/17 0930    Subjective  Patient arrives early.  4/10 knee pain.  Ambulating without assistive device.      Pertinent History  Lt TKA: 03/22/17    Currently in Pain?  Yes    Pain Score  4     Pain Location  Knee    Pain Orientation  Left    Pain Type  Surgical pain         [facility] PT Assessment - 04/15/17 0001      AROM   Left Knee Extension  5    Left Knee Flexion  82                  [facility] Adult PT Treatment/Exercise - 04/15/17 0001      Knee/Hip Exercises: Aerobic   Nustep   L1 legs only 10 min      Knee/Hip Exercises: Standing   Forward Step Up  Left;10 reps;Hand Hold: 2;Step  Height: 2"    Other Standing Knee Exercises  step taps with right foot on 1st step, weight on left    Other Standing Knee Exercises  retro stepping 15x      Knee/Hip Exercises: Seated   Long Arc Quad  AROM;Left;15 reps    Heel Slides  -- with green ball    Other Seated Knee/Hip Exercises  seated quad sets     Hamstring Curl  Strengthening;Left    Hamstring Limitations  yellow band 10x      Knee/Hip Exercises: Supine   Other Supine Knee/Hip Exercises  rolling green ball 2x10    Other Supine Knee/Hip Exercises  HS sets on green ball 10x      Vasopneumatic   Number Minutes Vasopneumatic   15 minutes    Vasopnuematic Location   Knee    Vasopneumatic Pressure  Medium    Vasopneumatic Temperature   15 minutes      Manual Therapy   Manual Therapy  Passive ROM;Other (comment)    Joint Mobilization  gentle distraction 5x    Passive ROM  knee flexion in sitting and supine 10x    Other Manual Therapy  manual HS stretch 2x 20 sec               PT Short Term Goals - 04/13/17 1024      PT SHORT TERM GOAL #1   Title  be independent in initial HEP    Time  4    Period  Weeks    Status  On-going      PT SHORT TERM GOAL #2   Title  ambulate with full weight bearing on the Lt and use cane for household distances    Baseline  using cane now    Time  4    Period  Weeks    Status  On-going        PT Long Term Goals - 04/07/17 1227      PT LONG TERM GOAL #1   Title  be independent in advanced HEP    Time  8    Period  Weeks    Status  New    Target Date  06/02/17      PT LONG TERM GOAL #2   Title  improve Lt LE strength to > or = to 4+/5 to allow for ascending steps with step-over step gait and perform sit to stand without UE support    Time  8    Period  Weeks    Status  New    Target Date  06/02/17      PT LONG TERM GOAL #3   Title  reduce FOTO to < or = to 39% limitation    Time  8    Period  Weeks    Status  New    Target Date  06/02/17      PT LONG TERM  GOAL #4   Title  demonstrate Lt knee A/ROM flexion to > or = to 115 degrees to allow for squatting     Time  8    Period  Weeks    Status  New    Target Date  06/02/17      PT LONG TERM GOAL #5   Title  wean from cane > or = to 50% of the time in the community and demonstrate symmetry on level surfaces    Time  8    Period  Weeks    Status  New    Target Date  06/02/17      Additional Long Term Goals   Additional Long Term Goals  Yes      PT LONG TERM GOAL #6   Title  report < or = to 3/10 Lt knee pain with standing and walking to improve independence with community and home function    Time  8    Period  Weeks    Status  New    Target Date  06/02/17            Plan - 04/15/17 1004    Clinical Impression Statement  The patient is ambulating without his cane today with decreased heelstrike and stance time on left.  Instructed patient to hold a few more days before inititating scar mobilization for a little more healing.  The patient has moderate discomfort with knee flexion and activation of quads.  Decreased quad activation typical for this length of time after TKA.  Improving knee ROM for flexion.      Rehab Potential  Good    PT Frequency  3x / week    PT Duration  8 weeks    PT Treatment/Interventions  ADLs/Self Care Home Management;Cryotherapy;Electrical Stimulation;Moist Heat;Gait training;Stair training;Functional mobility training;Therapeutic activities;Therapeutic exercise;Patient/family education;Neuromuscular re-education;Scar mobilization;Passive range of motion;Vasopneumatic Device;Taping    PT Next Visit Plan  Edema management, teach scar mobilization, manual to distal quad and hamstring, gait, flexibility;  quad activation; recheck ROM       Patient will benefit from skilled therapeutic intervention in order to improve the following deficits and impairments:  Abnormal gait, Pain, Decreased mobility, Decreased scar mobility, Decreased strength, Decreased  endurance, Decreased activity tolerance, Decreased range of motion, Difficulty walking, Increased edema, Impaired flexibility  Visit Diagnosis: Localized edema  Stiffness of left knee, not elsewhere classified  Acute pain of left knee  Muscle weakness (generalized)  Other abnormalities of gait and mobility     Problem List Patient Active Problem List   Diagnosis Date Noted  . Primary osteoarthritis of left knee 03/22/2017  . S/P total knee replacement using cement, left 03/22/2017   Ruben Im, PT 04/15/17 10:22 AM Phone: [phone] Fax: [phone]  Alvera Singh 04/15/2017, 10:21 AM  [facility] [address] [place], [place], [address] Phone: [phone]   Fax:  [phone]  Name: Chad Flores MRN: [id number] Date of Birth: 02-Feb-1951

## 2017-04-17 ENCOUNTER — Other Ambulatory Visit (INDEPENDENT_AMBULATORY_CARE_PROVIDER_SITE_OTHER): Payer: Self-pay | Admitting: *Deleted

## 2017-04-17 MED ORDER — METHOCARBAMOL 500 MG PO TABS: 500.0000 mg | ORAL_TABLET | Freq: Four times a day (QID) | ORAL | 0 refills | Status: DC | PRN

## 2017-04-18 ENCOUNTER — Ambulatory Visit (INDEPENDENT_AMBULATORY_CARE_PROVIDER_SITE_OTHER): Payer: Medicare Other | Admitting: Orthopaedic Surgery

## 2017-04-18 ENCOUNTER — Encounter: Payer: Medicare Other | Admitting: Physical Therapy

## 2017-04-19 ENCOUNTER — Encounter (INDEPENDENT_AMBULATORY_CARE_PROVIDER_SITE_OTHER): Payer: Self-pay | Admitting: Orthopaedic Surgery

## 2017-04-19 ENCOUNTER — Ambulatory Visit (INDEPENDENT_AMBULATORY_CARE_PROVIDER_SITE_OTHER): Payer: Medicare Other | Admitting: Orthopaedic Surgery

## 2017-04-19 VITALS — Resp 14 | Ht 70.0 in | Wt 175.0 lb

## 2017-04-19 DIAGNOSIS — Z96652 Presence of left artificial knee joint: Secondary | ICD-10-CM

## 2017-04-19 MED ORDER — OXYCODONE HCL 5 MG PO TABS: 5.0000 mg | ORAL_TABLET | Freq: Two times a day (BID) | ORAL | 0 refills | Status: DC | PRN

## 2017-04-19 NOTE — Progress Notes (Signed)
Office Visit Note   Patient: Chad Flores           Date of Birth: [birth year]           MRN: [id number] Visit Date: 04/19/2017              Requested by: Darcus Austin, MD [place], [place] [address] PCP: Darcus Austin, MD   Assessment & Plan: Visit Diagnoses:  1. History of left knee replacement     Plan: One month status post left total knee replacement doing well. Using any ambulatory aid. Continues on knee immobilizer and therapy. Range of motion 0 to about 85. Needs further work with range of motion. We'll renew Robaxin and OxyIR. Office 1 month  Follow-Up Instructions: Return in about 1 month (around 05/20/2017).   Orders:  No orders of the defined types were placed in this encounter.  Meds ordered this encounter  Medications  . oxyCODONE (OXY IR/ROXICODONE) 5 MG immediate release tablet    Sig: Take 1 tablet (5 mg total) by mouth every 12 (twelve) hours as needed for moderate pain or severe pain.    Dispense:  30 tablet    Refill:  0      Procedures: No procedures performed   Clinical Data: No additional findings.   Subjective: No chief complaint on file. Minimal discomfort except with therapy and sometimes at night. Still having some occasional calf pain. Doppler study prior to discharge was negative  HPI  Review of Systems  Constitutional: Negative for fatigue.  HENT: Negative for hearing loss.   Respiratory: Negative for apnea, chest tightness and shortness of breath.   Cardiovascular: Positive for leg swelling. Negative for chest pain and palpitations.  Gastrointestinal: Negative for blood in stool, constipation and diarrhea.  Genitourinary: Negative for difficulty urinating.  Musculoskeletal: Negative for arthralgias, back pain, joint swelling, myalgias, neck pain and neck stiffness.  Neurological: Negative for weakness, numbness and headaches.  Hematological: Does not bruise/bleed easily.  Psychiatric/Behavioral:  Negative for sleep disturbance. The patient is not nervous/anxious.      Objective: Vital Signs: Resp 14   Ht 5\' 10"  (1.778 m)   Wt 175 lb (79.4 kg)   BMI 25.11 kg/m   Physical Exam  Ortho Exam left knee incision clean and dry without evidence of infection. Full quick extension and about 85 of flexion. No instability. No ankle swelling. Some mild calf discomfort neurovascular exam intact  Specialty Comments:  No specialty comments available.  Imaging: No results found.   PMFS History: Patient Active Problem List   Diagnosis Date Noted  . Primary osteoarthritis of left knee 03/22/2017  . S/P total knee replacement using cement, left 03/22/2017   Past Medical History:  Diagnosis Date  . Anemia   . Anxiety   . COPD (chronic obstructive pulmonary disease) ([place])   . Osteoarthritis     Family History  Problem Relation Age of Onset  . Alzheimer's disease Mother   . Cancer Father     Past Surgical History:  Procedure Laterality Date  . CHOLECYSTECTOMY    . ELBOW SURGERY    . KNEE ARTHROPLASTY    . KNEE SURGERY    . TOTAL KNEE ARTHROPLASTY Left 03/22/2017  . TOTAL KNEE ARTHROPLASTY Left 03/22/2017   Procedure: LEFT TOTAL KNEE ARTHROPLASTY;  Surgeon: Garald Balding, MD;  Location: [facility];  Service: Orthopedics;  Laterality: Left;  . TRIGGER FINGER RELEASE     Social History   Occupational  History  . Not on file  Tobacco Use  . Smoking status: Former Smoker    Packs/day: 1.00    Years: 52.50    Pack years: 52.50    Types: Cigarettes  . Smokeless tobacco: Never Used  . Tobacco comment: Now uses e cigarettes. Encouraged to quit smoking completely.  Substance and Sexual Activity  . Alcohol use: Yes    Alcohol/week: 0.6 oz    Types: 1 Standard drinks or equivalent per week    Comment: occ  . Drug use: No  . Sexual activity: Not on file

## 2017-04-20 ENCOUNTER — Ambulatory Visit: Payer: Medicare Other

## 2017-04-20 DIAGNOSIS — M6281 Muscle weakness (generalized): Secondary | ICD-10-CM

## 2017-04-20 DIAGNOSIS — R600 Localized edema: Secondary | ICD-10-CM | POA: Diagnosis not present

## 2017-04-20 DIAGNOSIS — R2689 Other abnormalities of gait and mobility: Secondary | ICD-10-CM

## 2017-04-20 DIAGNOSIS — M25562 Pain in left knee: Secondary | ICD-10-CM

## 2017-04-20 DIAGNOSIS — M25662 Stiffness of left knee, not elsewhere classified: Secondary | ICD-10-CM | POA: Diagnosis not present

## 2017-04-20 NOTE — Therapy (Signed)
[facility] [address], [place], [place], [address] Phone: [phone]   Fax:  [phone]  Physical Therapy Treatment  Patient Details  Name: Chad Flores MRN: [id number] Date of Birth: 10-Oct-1950 Referring Provider: [name], MD   Encounter Date: 04/20/2017  PT End of Session - 04/20/17 1317    Visit Number  4    Number of Visits  10    Date for PT Re-Evaluation  06/02/17    Authorization Type  Medicare/Tricare    PT Start Time  1225    PT Stop Time  1325    PT Time Calculation (min)  60 min    Activity Tolerance  Patient tolerated treatment well    Behavior During Therapy  [facility] for tasks assessed/performed       Past Medical History:  Diagnosis Date  . Anemia   . Anxiety   . COPD (chronic obstructive pulmonary disease) ([place])   . Osteoarthritis     Past Surgical History:  Procedure Laterality Date  . CHOLECYSTECTOMY    . ELBOW SURGERY    . KNEE ARTHROPLASTY    . KNEE SURGERY    . TOTAL KNEE ARTHROPLASTY Left 03/22/2017  . TOTAL KNEE ARTHROPLASTY Left 03/22/2017   Procedure: LEFT TOTAL KNEE ARTHROPLASTY;  Surgeon: Garald Balding, MD;  Location: [facility];  Service: Orthopedics;  Laterality: Left;  . TRIGGER FINGER RELEASE      There were no vitals filed for this visit.  Subjective Assessment - 04/20/17 1237    Subjective  I've been doing my exercises.  I saw MD yesterday.  He is pleased with my progress.      Pertinent History  Lt TKA: 03/22/17    Currently in Pain?  Yes    Pain Score  4     Pain Location  Knee    Pain Orientation  Left    Pain Descriptors / Indicators  Aching    Pain Onset  1 to 4 weeks ago    Pain Frequency  Constant    Aggravating Factors   bending knee, getting up from sitting    Pain Relieving Factors  ice, rest, elevation                      [facility] Adult PT Treatment/Exercise - 04/20/17 0001      Knee/Hip Exercises: Stretches   Active  Hamstring Stretch  Left;3 reps;20 seconds      Knee/Hip Exercises: Aerobic   Nustep   L1 legs only 9  min      Knee/Hip Exercises: Standing   Forward Step Up  Left;10 reps;Hand Hold: 2;Step Height: 6"    Rocker Board  3 minutes    Rebounder  weight shifting 3 ways x1 minute each      Knee/Hip Exercises: Seated   Long Arc Quad  AROM;Left;15 reps    Hamstring Curl  Strengthening;Left    Hamstring Limitations  red band 20x      Knee/Hip Exercises: Supine   Short Arc Quad Sets  Strengthening;Left;2 sets;10 reps    Heel Slides  AAROM;Left;10 reps      Modalities   Modalities  Vasopneumatic      Vasopneumatic   Number Minutes Vasopneumatic   15 minutes    Vasopnuematic Location   Knee    Vasopneumatic Pressure  Medium    Vasopneumatic Temperature   15 minutes      Manual Therapy   Manual Therapy  Passive ROM;Edema management    Manual therapy comments  soft tissue elongation to distal quad and hamstring and proximal gastroc on the Lt.  mobilization of tissue around incision               PT Short Term Goals - 04/20/17 1239      PT SHORT TERM GOAL #1   Title  be independent in initial HEP    Status  Achieved      PT SHORT TERM GOAL #2   Title  ambulate with full weight bearing on the Lt and use cane for household distances    Status  Achieved        PT Long Term Goals - 04/07/17 1227      PT LONG TERM GOAL #1   Title  be independent in advanced HEP    Time  8    Period  Weeks    Status  New    Target Date  06/02/17      PT LONG TERM GOAL #2   Title  improve Lt LE strength to > or = to 4+/5 to allow for ascending steps with step-over step gait and perform sit to stand without UE support    Time  8    Period  Weeks    Status  New    Target Date  06/02/17      PT LONG TERM GOAL #3   Title  reduce FOTO to < or = to 39% limitation    Time  8    Period  Weeks    Status  New    Target Date  06/02/17      PT LONG TERM GOAL #4   Title  demonstrate Lt  knee A/ROM flexion to > or = to 115 degrees to allow for squatting     Time  8    Period  Weeks    Status  New    Target Date  06/02/17      PT LONG TERM GOAL #5   Title  wean from cane > or = to 50% of the time in the community and demonstrate symmetry on level surfaces    Time  8    Period  Weeks    Status  New    Target Date  06/02/17      Additional Long Term Goals   Additional Long Term Goals  Yes      PT LONG TERM GOAL #6   Title  report < or = to 3/10 Lt knee pain with standing and walking to improve independence with community and home function    Time  8    Period  Weeks    Status  New    Target Date  06/02/17            Plan - 04/20/17 1243    Clinical Impression Statement  Pt is ambulating without his cane today with improved gait pattern and heel strike.  Equal time spent in stance and decreased Lt knee flexion in swing through phase of gait.  Pt able to ascend 4" step with mimimal UE support.  Pt will continue to benefit from skilled PT s/p Lt TKA for strength, A/ROM, edema management and gait to improve mobility and stability.      Rehab Potential  Good    PT Frequency  3x / week    PT Duration  8 weeks    PT Treatment/Interventions  ADLs/Self Care Home Management;Cryotherapy;Electrical Stimulation;Moist  Heat;Gait training;Stair training;Functional mobility training;Therapeutic activities;Therapeutic exercise;Patient/family education;Neuromuscular re-education;Scar mobilization;Passive range of motion;Vasopneumatic Device;Taping    PT Next Visit Plan  manual, edema management, flexibility and strength    Consulted and Agree with Plan of Care  Patient       Patient will benefit from skilled therapeutic intervention in order to improve the following deficits and impairments:  Abnormal gait, Pain, Decreased mobility, Decreased scar mobility, Decreased strength, Decreased endurance, Decreased activity tolerance, Decreased range of motion, Difficulty walking,  Increased edema, Impaired flexibility  Visit Diagnosis: Localized edema  Stiffness of left knee, not elsewhere classified  Acute pain of left knee  Muscle weakness (generalized)  Other abnormalities of gait and mobility     Problem List Patient Active Problem List   Diagnosis Date Noted  . Primary osteoarthritis of left knee 03/22/2017  . S/P total knee replacement using cement, left 03/22/2017     Sigurd Sos, PT 04/20/17 1:20 PM  [facility] [address] [place], [place], [address] Phone: [phone]   Fax:  [phone]  Name: Chad Flores MRN: [id number] Date of Birth: July 12, 1950

## 2017-04-22 ENCOUNTER — Ambulatory Visit: Payer: Medicare Other | Admitting: Physical Therapy

## 2017-04-22 ENCOUNTER — Encounter: Payer: Self-pay | Admitting: Physical Therapy

## 2017-04-22 DIAGNOSIS — M25662 Stiffness of left knee, not elsewhere classified: Secondary | ICD-10-CM | POA: Diagnosis not present

## 2017-04-22 DIAGNOSIS — M6281 Muscle weakness (generalized): Secondary | ICD-10-CM

## 2017-04-22 DIAGNOSIS — M25562 Pain in left knee: Secondary | ICD-10-CM

## 2017-04-22 DIAGNOSIS — R600 Localized edema: Secondary | ICD-10-CM | POA: Diagnosis not present

## 2017-04-22 DIAGNOSIS — R2689 Other abnormalities of gait and mobility: Secondary | ICD-10-CM

## 2017-04-22 NOTE — Therapy (Signed)
[facility] [address] [place], [place], [address] Phone: [phone]   Fax:  [phone]  Physical Therapy Treatment  Patient Details  Name: Chad Flores MRN: [id number] Date of Birth: Jun 13, 1950 Referring Provider: [name], MD   Encounter Date: 04/22/2017  PT End of Session - 04/22/17 1122    Visit Number  5    Number of Visits  10    Date for PT Re-Evaluation  06/02/17    Authorization Type  Medicare/Tricare    PT Start Time  1056    PT Stop Time  1149    PT Time Calculation (min)  53 min    Activity Tolerance  Patient tolerated treatment well       Past Medical History:  Diagnosis Date  . Anemia   . Anxiety   . COPD (chronic obstructive pulmonary disease) ([place])   . Osteoarthritis     Past Surgical History:  Procedure Laterality Date  . CHOLECYSTECTOMY    . ELBOW SURGERY    . KNEE ARTHROPLASTY    . KNEE SURGERY    . TOTAL KNEE ARTHROPLASTY Left 03/22/2017  . TOTAL KNEE ARTHROPLASTY Left 03/22/2017   Procedure: LEFT TOTAL KNEE ARTHROPLASTY;  Surgeon: Garald Balding, MD;  Location: [place];  Service: Orthopedics;  Laterality: Left;  . TRIGGER FINGER RELEASE      There were no vitals filed for this visit.  Subjective Assessment - 04/22/17 1056    Subjective  Continues to ambulate without assistive device.      Pertinent History  Lt TKA: 03/22/17;  MD early Jan    Patient Stated Goals  improve gait, reduce pain, improve Lt knee A/ROM, endurance    Currently in Pain?  Yes    Pain Score  4     Pain Location  Knee    Pain Orientation  Left    Pain Type  Surgical pain    Pain Frequency  Constant                      [facility] Adult PT Treatment/Exercise - 04/22/17 0001      Ambulation/Gait   Gait Comments  instruction on heel toe gait      Knee/Hip Exercises: Aerobic   Nustep   L1 legs only 10 min seat 8      Knee/Hip Exercises: Standing   Forward Step Up   Left;10 reps;Hand Hold: 2;Step Height: 6"    Other Standing Knee Exercises  step taps with right foot on 1st step, weight on left    Other Standing Knee Exercises  3 ways WB on left 10x      Knee/Hip Exercises: Seated   Long Arc Quad  Strengthening;Left;15 reps 2#    Hamstring Curl  Strengthening;Left    Hamstring Limitations  red band 20x    Sit to Sand  10 reps;without UE support from high table      Knee/Hip Exercises: Supine   Other Supine Knee/Hip Exercises  rolling green ball 2x10    Other Supine Knee/Hip Exercises  HS sets on green ball 10x      Vasopneumatic   Number Minutes Vasopneumatic   15 minutes    Vasopnuematic Location   Knee    Vasopneumatic Pressure  Medium    Vasopneumatic Temperature   15 minutes      Manual Therapy   Joint Mobilization  gentle distraction 5x    Passive ROM  knee flexion in  sitting and supine 10x               PT Short Term Goals - 04/20/17 1239      PT SHORT TERM GOAL #1   Title  be independent in initial HEP    Status  Achieved      PT SHORT TERM GOAL #2   Title  ambulate with full weight bearing on the Lt and use cane for household distances    Status  Achieved        PT Long Term Goals - 04/07/17 1227      PT LONG TERM GOAL #1   Title  be independent in advanced HEP    Time  8    Period  Weeks    Status  New    Target Date  06/02/17      PT LONG TERM GOAL #2   Title  improve Lt LE strength to > or = to 4+/5 to allow for ascending steps with step-over step gait and perform sit to stand without UE support    Time  8    Period  Weeks    Status  New    Target Date  06/02/17      PT LONG TERM GOAL #3   Title  reduce FOTO to < or = to 39% limitation    Time  8    Period  Weeks    Status  New    Target Date  06/02/17      PT LONG TERM GOAL #4   Title  demonstrate Lt knee A/ROM flexion to > or = to 115 degrees to allow for squatting     Time  8    Period  Weeks    Status  New    Target Date  06/02/17      PT  LONG TERM GOAL #5   Title  wean from cane > or = to 50% of the time in the community and demonstrate symmetry on level surfaces    Time  8    Period  Weeks    Status  New    Target Date  06/02/17      Additional Long Term Goals   Additional Long Term Goals  Yes      PT LONG TERM GOAL #6   Title  report < or = to 3/10 Lt knee pain with standing and walking to improve independence with community and home function    Time  8    Period  Weeks    Status  New    Target Date  06/02/17            Plan - 04/22/17 1122    Clinical Impression Statement  The patient continues to have limited knee flexion but improving weekly.  Decreased terminal knee extension and heel strike during gait.  Improving quad motor control.  Decreasing edema.  Good incisional healing.      Rehab Potential  Good    PT Frequency  3x / week    PT Duration  8 weeks    PT Treatment/Interventions  ADLs/Self Care Home Management;Cryotherapy;Electrical Stimulation;Moist Heat;Gait training;Stair training;Functional mobility training;Therapeutic activities;Therapeutic exercise;Patient/family education;Neuromuscular re-education;Scar mobilization;Passive range of motion;Vasopneumatic Device;Taping    PT Next Visit Plan  manual, edema management, flexibility and strength       Patient will benefit from skilled therapeutic intervention in order to improve the following deficits and impairments:  Abnormal gait, Pain, Decreased mobility, Decreased scar mobility,  Decreased strength, Decreased endurance, Decreased activity tolerance, Decreased range of motion, Difficulty walking, Increased edema, Impaired flexibility  Visit Diagnosis: Localized edema  Stiffness of left knee, not elsewhere classified  Acute pain of left knee  Muscle weakness (generalized)  Other abnormalities of gait and mobility     Problem List Patient Active Problem List   Diagnosis Date Noted  . Primary osteoarthritis of left knee 03/22/2017   . S/P total knee replacement using cement, left 03/22/2017   Ruben Im, PT 04/22/17 11:46 AM Phone: [phone] Fax: [phone]  Alvera Singh 04/22/2017, 11:46 AM  [facility] [address] [place], [place], [address] Phone: [phone]   Fax:  [phone]  Name: RUGER SAXER MRN: [id number] Date of Birth: 1950-11-13

## 2017-04-25 ENCOUNTER — Ambulatory Visit: Payer: Medicare Other

## 2017-04-25 DIAGNOSIS — M25562 Pain in left knee: Secondary | ICD-10-CM | POA: Diagnosis not present

## 2017-04-25 DIAGNOSIS — R600 Localized edema: Secondary | ICD-10-CM | POA: Diagnosis not present

## 2017-04-25 DIAGNOSIS — R2689 Other abnormalities of gait and mobility: Secondary | ICD-10-CM | POA: Diagnosis not present

## 2017-04-25 DIAGNOSIS — M6281 Muscle weakness (generalized): Secondary | ICD-10-CM

## 2017-04-25 DIAGNOSIS — M25662 Stiffness of left knee, not elsewhere classified: Secondary | ICD-10-CM

## 2017-04-25 NOTE — Therapy (Signed)
[facility] [address], [place], [place], [address] Phone: [phone]   Fax:  [phone]  Physical Therapy Treatment  Patient Details  Name: Chad Flores MRN: [id number] Date of Birth: 1951/04/29 Referring Provider: [name], MD   Encounter Date: 04/25/2017  PT End of Session - 04/25/17 1218    Visit Number  6    Number of Visits  10    Date for PT Re-Evaluation  06/02/17    Authorization Type  Medicare/Tricare    PT Start Time  1130    PT Stop Time  1228    PT Time Calculation (min)  58 min    Activity Tolerance  Patient tolerated treatment well    Behavior During Therapy  [facility] for tasks assessed/performed       Past Medical History:  Diagnosis Date  . Anemia   . Anxiety   . COPD (chronic obstructive pulmonary disease) ([place])   . Osteoarthritis     Past Surgical History:  Procedure Laterality Date  . CHOLECYSTECTOMY    . ELBOW SURGERY    . KNEE ARTHROPLASTY    . KNEE SURGERY    . TOTAL KNEE ARTHROPLASTY Left 03/22/2017  . TOTAL KNEE ARTHROPLASTY Left 03/22/2017   Procedure: LEFT TOTAL KNEE ARTHROPLASTY;  Surgeon: Garald Balding, MD;  Location: [place];  Service: Orthopedics;  Laterality: Left;  . TRIGGER FINGER RELEASE      There were no vitals filed for this visit.  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      Pertinent History  Lt TKA: 03/22/17;  MD early Jan    Currently in Pain?  Yes    Pain Score  3     Pain Location  Knee    Pain Orientation  Left    Pain Descriptors / Indicators  Aching    Pain Type  Surgical pain    Pain Onset  1 to 4 weeks ago    Pain Frequency  Constant    Aggravating Factors   bending knee, sitting too long    Pain Relieving Factors  ice, rest, movement to loosen it          [facility] PT Assessment - 04/25/17 0001      AROM   Left Knee Extension   4    Left Knee Flexion  89                  [facility] Adult PT Treatment/Exercise - 04/25/17 0001      Knee/Hip Exercises: Stretches   Active Hamstring Stretch  Left;3 reps;20 seconds      Knee/Hip Exercises: Aerobic   Nustep  Level 2, seat 8 legs only x 10 minutes      Knee/Hip Exercises: Standing   Forward Step Up  Left;10 reps;Hand Hold: 2;Step Height: 6"    Rocker Board  3 minutes      Knee/Hip Exercises: Seated   Long Arc Quad  Strengthening;Left;1 set;10 reps 2#    Hamstring Curl  Strengthening;Left    Hamstring Limitations  red band 20x    Sit to [employer]  10 reps;without UE support from high table      Knee/Hip Exercises: Supine   Short Arc Quad Sets  Strengthening;Left;2 sets;10 reps    Heel Slides  AAROM;Left;10 reps    Other Supine Knee/Hip Exercises  rolling  green ball 2x10      Modalities   Modalities  Vasopneumatic      Vasopneumatic   Number Minutes Vasopneumatic   15 minutes    Vasopnuematic Location   Knee    Vasopneumatic Pressure  Medium    Vasopneumatic Temperature   15 minutes      Manual Therapy   Manual Therapy  Passive ROM;Edema management    Manual therapy comments  soft tissue elongation to distal quad and hamstring and proximal gastroc on the Lt.  mobilization of tissue around incision               PT Short Term Goals - 04/25/17 1143      PT SHORT TERM GOAL #2   Title  ambulate with full weight bearing on the Lt and use cane for household distances    Status  Achieved      PT SHORT TERM GOAL #3   Title  demonstrate Lt knee A/ROM flexion to > or = to 95 degrees to allow for sitting without substitution    Baseline  89 flexion    Time  4    Period  Weeks    Status  On-going        PT Long Term Goals - 04/07/17 1227      PT LONG TERM GOAL #1   Title  be independent in advanced HEP    Time  8    Period  Weeks    Status  New    Target Date  06/02/17      PT LONG TERM GOAL #2   Title  improve Lt LE strength to > or =  to 4+/5 to allow for ascending steps with step-over step gait and perform sit to stand without UE support    Time  8    Period  Weeks    Status  New    Target Date  06/02/17      PT LONG TERM GOAL #3   Title  reduce FOTO to < or = to 39% limitation    Time  8    Period  Weeks    Status  New    Target Date  06/02/17      PT LONG TERM GOAL #4   Title  demonstrate Lt knee A/ROM flexion to > or = to 115 degrees to allow for squatting     Time  8    Period  Weeks    Status  New    Target Date  06/02/17      PT LONG TERM GOAL #5   Title  wean from cane > or = to 50% of the time in the community and demonstrate symmetry on level surfaces    Time  8    Period  Weeks    Status  New    Target Date  06/02/17      Additional Long Term Goals   Additional Long Term Goals  Yes      PT LONG TERM GOAL #6   Title  report < or = to 3/10 Lt knee pain with standing and walking to improve independence with community and home function    Time  8    Period  Weeks    Status  New    Target Date  06/02/17            Plan - 04/25/17 1143    Clinical Impression Statement  Pt ambulates without device and demonstrates  very mild antalgia.  Pt demonstrates reduced terminal knee extension with heel strike due to edema. Pt is able to ascend 6" step on the Lt with reduced UE support.  Pt with improved Lt knee flexion with edema still present that is limiting further gains at this time.  Pt with improved functional quad strength with ability to perform sit to stand without UE support.  Pt will continue to benefit from skilled PT for Lt LE flexibility, strength and pain/edema management to improve function and gait.      Rehab Potential  Good    PT Frequency  3x / week    PT Duration  8 weeks    PT Treatment/Interventions  ADLs/Self Care Home Management;Cryotherapy;Electrical Stimulation;Moist Heat;Gait training;Stair training;Functional mobility training;Therapeutic activities;Therapeutic  exercise;Patient/family education;Neuromuscular re-education;Scar mobilization;Passive range of motion;Vasopneumatic Device;Taping    PT Next Visit Plan  manual, edema management, flexibility and strength    Consulted and Agree with Plan of Care  Patient       Patient will benefit from skilled therapeutic intervention in order to improve the following deficits and impairments:  Abnormal gait, Pain, Decreased mobility, Decreased scar mobility, Decreased strength, Decreased endurance, Decreased activity tolerance, Decreased range of motion, Difficulty walking, Increased edema, Impaired flexibility  Visit Diagnosis: Localized edema  Stiffness of left knee, not elsewhere classified  Acute pain of left knee  Muscle weakness (generalized)  Other abnormalities of gait and mobility     Problem List Patient Active Problem List   Diagnosis Date Noted  . Primary osteoarthritis of left knee 03/22/2017  . S/P total knee replacement using cement, left 03/22/2017    Karleen Seebeck 04/25/2017, 12:22 PM  [facility] [address] [place], [place], [address] Phone: [phone]   Fax:  [phone]  Name: Chad Flores MRN: [id number] Date of Birth: [birth year]

## 2017-04-27 ENCOUNTER — Ambulatory Visit: Payer: Medicare Other

## 2017-04-27 DIAGNOSIS — M25562 Pain in left knee: Secondary | ICD-10-CM | POA: Diagnosis not present

## 2017-04-27 DIAGNOSIS — R2689 Other abnormalities of gait and mobility: Secondary | ICD-10-CM | POA: Diagnosis not present

## 2017-04-27 DIAGNOSIS — M6281 Muscle weakness (generalized): Secondary | ICD-10-CM

## 2017-04-27 DIAGNOSIS — M25662 Stiffness of left knee, not elsewhere classified: Secondary | ICD-10-CM

## 2017-04-27 DIAGNOSIS — R600 Localized edema: Secondary | ICD-10-CM

## 2017-04-27 NOTE — Therapy (Signed)
[facility] [address] [place], [place], [address] Phone: [phone]   Fax:  [phone]  Physical Therapy Treatment  Patient Details  Name: Chad Flores MRN: [id number] Date of Birth: [birth year] Referring Provider: [name], MD   Encounter Date: 04/27/2017  PT End of Session - 04/27/17 1141    Visit Number  7    Number of Visits  10    Date for PT Re-Evaluation  06/02/17    Authorization Type  Medicare/Tricare    PT Start Time  1100    PT Stop Time  1155    PT Time Calculation (min)  55 min    Activity Tolerance  Patient tolerated treatment well    Behavior During Therapy  [facility] for tasks assessed/performed       Past Medical History:  Diagnosis Date  . Anemia   . Anxiety   . COPD (chronic obstructive pulmonary disease) ([place])   . Osteoarthritis     Past Surgical History:  Procedure Laterality Date  . CHOLECYSTECTOMY    . ELBOW SURGERY    . KNEE ARTHROPLASTY    . KNEE SURGERY    . TOTAL KNEE ARTHROPLASTY Left 03/22/2017  . TOTAL KNEE ARTHROPLASTY Left 03/22/2017   Procedure: LEFT TOTAL KNEE ARTHROPLASTY;  Surgeon: Garald Balding, MD;  Location: [place];  Service: Orthopedics;  Laterality: Left;  . TRIGGER FINGER RELEASE      There were no vitals filed for this visit.  Subjective Assessment - 04/27/17 1110    Subjective  I did well with work on Monday.      Currently in Pain?  Yes    Pain Score  5     Pain Location  Knee    Pain Orientation  Left    Pain Descriptors / Indicators  Aching                      [facility] Adult PT Treatment/Exercise - 04/27/17 0001      Knee/Hip Exercises: Stretches   Active Hamstring Stretch  Left;3 reps;20 seconds      Knee/Hip Exercises: Aerobic   Nustep  Level 2, seat 10 to seat 6 to increase bend-legs only x 10 minutes      Knee/Hip Exercises: Machines for Strengthening   Total Gym Leg Press  seat 7 bil 70# 3x10      Knee/Hip  Exercises: Standing   Forward Step Up  Left;10 reps;Hand Hold: 2;Step Height: 6"    Rocker Board  3 minutes      Knee/Hip Exercises: Seated   Long Arc Quad  Strengthening;Left;1 set;10 reps 2#    Hamstring Curl  Strengthening;Left    Hamstring Limitations  red band 20x    Sit to [employer]  10 reps;without UE support from low mat table      Knee/Hip Exercises: Supine   Other Supine Knee/Hip Exercises  rolling green ball 2x10      Modalities   Modalities  Vasopneumatic      Vasopneumatic   Number Minutes Vasopneumatic   15 minutes    Vasopnuematic Location   Knee    Vasopneumatic Pressure  Medium    Vasopneumatic Temperature   15 minutes      Manual Therapy   Manual Therapy  Passive ROM;Edema management    Manual therapy comments  soft tissue elongation to distal quad and hamstring and proximal gastroc on the Lt.  mobilization of tissue around incision  PT Short Term Goals - 04/25/17 1143      PT SHORT TERM GOAL #2   Title  ambulate with full weight bearing on the Lt and use cane for household distances    Status  Achieved      PT SHORT TERM GOAL #3   Title  demonstrate Lt knee A/ROM flexion to > or = to 95 degrees to allow for sitting without substitution    Baseline  89 flexion    Time  4    Period  Weeks    Status  On-going        PT Long Term Goals - 04/07/17 1227      PT LONG TERM GOAL #1   Title  be independent in advanced HEP    Time  8    Period  Weeks    Status  New    Target Date  06/02/17      PT LONG TERM GOAL #2   Title  improve Lt LE strength to > or = to 4+/5 to allow for ascending steps with step-over step gait and perform sit to stand without UE support    Time  8    Period  Weeks    Status  New    Target Date  06/02/17      PT LONG TERM GOAL #3   Title  reduce FOTO to < or = to 39% limitation    Time  8    Period  Weeks    Status  New    Target Date  06/02/17      PT LONG TERM GOAL #4   Title  demonstrate Lt knee  A/ROM flexion to > or = to 115 degrees to allow for squatting     Time  8    Period  Weeks    Status  New    Target Date  06/02/17      PT LONG TERM GOAL #5   Title  wean from cane > or = to 50% of the time in the community and demonstrate symmetry on level surfaces    Time  8    Period  Weeks    Status  New    Target Date  06/02/17      Additional Long Term Goals   Additional Long Term Goals  Yes      PT LONG TERM GOAL #6   Title  report < or = to 3/10 Lt knee pain with standing and walking to improve independence with community and home function    Time  8    Period  Weeks    Status  New    Target Date  06/02/17            Plan - 04/27/17 1111    Clinical Impression Statement  Pt is making steady progress s/p TKA.  Pt tolerated the addition of leg press today without difficutly Pt with reduced terminal knee extension with heel strike due to edema.  Pt is able to ascend a 6" step on the Lt with reduced UE support.  PT with improved Lt knee flexion with edema still present that is limiting further gains at this time.  Pt will continue to benefit from skilled PT for Lt LE flexibility, strength and edema management.      PT Frequency  3x / week    PT Duration  8 weeks    PT Treatment/Interventions  ADLs/Self Care Home Management;Cryotherapy;Electrical Stimulation;Moist Heat;Gait training;Stair training;Functional  mobility training;Therapeutic activities;Therapeutic exercise;Patient/family education;Neuromuscular re-education;Scar mobilization;Passive range of motion;Vasopneumatic Device;Taping    PT Next Visit Plan  manual, edema management, flexibility and strength    Consulted and Agree with Plan of Care  Patient       Patient will benefit from skilled therapeutic intervention in order to improve the following deficits and impairments:  Abnormal gait, Pain, Decreased mobility, Decreased scar mobility, Decreased strength, Decreased endurance, Decreased activity tolerance,  Decreased range of motion, Difficulty walking, Increased edema, Impaired flexibility  Visit Diagnosis: Localized edema  Stiffness of left knee, not elsewhere classified  Acute pain of left knee  Muscle weakness (generalized)  Other abnormalities of gait and mobility     Problem List Patient Active Problem List   Diagnosis Date Noted  . Primary osteoarthritis of left knee 03/22/2017  . S/P total knee replacement using cement, left 03/22/2017     Sigurd Sos, PT 04/27/17 11:45 AM  [facility] [address], [place], [place], [address] Phone: [phone]   Fax:  [phone]  Name: ULICE FOLLETT MRN: [id number] Date of Birth: [birth year]

## 2017-04-29 ENCOUNTER — Ambulatory Visit: Payer: Medicare Other | Admitting: Physical Therapy

## 2017-04-29 ENCOUNTER — Encounter: Payer: Self-pay | Admitting: Physical Therapy

## 2017-04-29 DIAGNOSIS — M25562 Pain in left knee: Secondary | ICD-10-CM | POA: Diagnosis not present

## 2017-04-29 DIAGNOSIS — M6281 Muscle weakness (generalized): Secondary | ICD-10-CM

## 2017-04-29 DIAGNOSIS — M25662 Stiffness of left knee, not elsewhere classified: Secondary | ICD-10-CM

## 2017-04-29 DIAGNOSIS — R600 Localized edema: Secondary | ICD-10-CM | POA: Diagnosis not present

## 2017-04-29 DIAGNOSIS — R2689 Other abnormalities of gait and mobility: Secondary | ICD-10-CM | POA: Diagnosis not present

## 2017-04-29 NOTE — Therapy (Signed)
[facility] [address], [place], [place], [address] Phone: [phone]   Fax:  [phone]  Physical Therapy Treatment  Patient Details  Name: Chad Flores MRN: [id number] Date of Birth: [birth year] Referring Provider: [name], MD   Encounter Date: 04/29/2017  PT End of Session - 04/29/17 0914    Visit Number  8    Number of Visits  10    Date for PT Re-Evaluation  06/02/17    Authorization Type  Medicare/Tricare    PT Start Time  3754    PT Stop Time  1015    PT Time Calculation (min)  61 min    Activity Tolerance  Patient tolerated treatment well       Past Medical History:  Diagnosis Date  . Anemia   . Anxiety   . COPD (chronic obstructive pulmonary disease) ([place])   . Osteoarthritis     Past Surgical History:  Procedure Laterality Date  . CHOLECYSTECTOMY    . ELBOW SURGERY    . KNEE ARTHROPLASTY    . KNEE SURGERY    . TOTAL KNEE ARTHROPLASTY Left 03/22/2017  . TOTAL KNEE ARTHROPLASTY Left 03/22/2017   Procedure: LEFT TOTAL KNEE ARTHROPLASTY;  Surgeon: Garald Balding, MD;  Location: [place];  Service: Orthopedics;  Laterality: Left;  . TRIGGER FINGER RELEASE      There were no vitals filed for this visit.  Subjective Assessment - 04/29/17 0915    Subjective  My knee is pretty stiff this AM.     Currently in Pain?  Yes    Pain Score  2     Pain Location  Knee    Pain Orientation  Left    Pain Descriptors / Indicators  Dull;Sore    Aggravating Factors   bending/sitting too long    Pain Relieving Factors  ice, Game Ready         [facility] PT Assessment - 04/29/17 0001      AROM   Left Knee Extension  4    Left Knee Flexion  96      Strength   Left Knee Flexion  4/5    Left Knee Extension  4/5                  [facility] Adult PT Treatment/Exercise - 04/29/17 0001      Knee/Hip Exercises: Stretches   Active Hamstring Stretch  Left;3 reps;20 seconds    Quad  Stretch  Left;5 reps on 2nd step      Knee/Hip Exercises: Aerobic   Nustep  L3 seat 9 then 8 at 4 min. Total 10 min      Knee/Hip Exercises: Machines for Strengthening   Total Gym Leg Press  seat 7 bil 70# 15x; left only 35# 15x      Knee/Hip Exercises: Standing   Forward Step Up  Left;10 reps;Hand Hold: 2;Step Height: 6"    Step Down  Right;10 reps;Hand Hold: 2;Step Height: 4"    Other Standing Knee Exercises  --      Knee/Hip Exercises: Seated   Long Arc Quad  Strengthening;Left;15 reps;Weights    Long Arc Quad Weight  3 lbs.    Hamstring Curl  Strengthening;Left    Hamstring Limitations  red band 20x      Knee/Hip Exercises: Supine   Other Supine Knee/Hip Exercises  rolling green ball 2x10      Vasopneumatic   Number Minutes Vasopneumatic   15 minutes  Vasopnuematic Location   Knee    Vasopneumatic Temperature   15 minutes      Manual Therapy   Joint Mobilization  gentle distraction 5x    Passive ROM  knee flexion in sitting and supine 10x               PT Short Term Goals - 04/29/17 0954      PT SHORT TERM GOAL #1   Title  be independent in initial HEP    Status  Achieved      PT SHORT TERM GOAL #2   Title  ambulate with full weight bearing on the Lt and use cane for household distances    Status  Achieved      PT SHORT TERM GOAL #3   Title  demonstrate Lt knee A/ROM flexion to > or = to 95 degrees to allow for sitting without substitution    Status  Achieved      PT SHORT TERM GOAL #4   Title  demonstrate 4/5 Lt knee strength to improve endurance and improve sit to stand    Status  Achieved        PT Long Term Goals - 04/07/17 1227      PT LONG TERM GOAL #1   Title  be independent in advanced HEP    Time  8    Period  Weeks    Status  New    Target Date  06/02/17      PT LONG TERM GOAL #2   Title  improve Lt LE strength to > or = to 4+/5 to allow for ascending steps with step-over step gait and perform sit to stand without UE support     Time  8    Period  Weeks    Status  New    Target Date  06/02/17      PT LONG TERM GOAL #3   Title  reduce FOTO to < or = to 39% limitation    Time  8    Period  Weeks    Status  New    Target Date  06/02/17      PT LONG TERM GOAL #4   Title  demonstrate Lt knee A/ROM flexion to > or = to 115 degrees to allow for squatting     Time  8    Period  Weeks    Status  New    Target Date  06/02/17      PT LONG TERM GOAL #5   Title  wean from cane > or = to 50% of the time in the community and demonstrate symmetry on level surfaces    Time  8    Period  Weeks    Status  New    Target Date  06/02/17      Additional Long Term Goals   Additional Long Term Goals  Yes      PT LONG TERM GOAL #6   Title  report < or = to 3/10 Lt knee pain with standing and walking to improve independence with community and home function    Time  8    Period  Weeks    Status  New    Target Date  06/02/17            Plan - 04/29/17 0948    Clinical Impression Statement  The patient's knee flexion ROM with good improvements today.  Improving quad motor control as well with step ups but  4 inch step down moderately difficult.   Decreasing knee edema.   Progressing with rehab goals.  All STGs met.      Rehab Potential  Good    PT Frequency  3x / week    PT Duration  8 weeks    PT Treatment/Interventions  ADLs/Self Care Home Management;Cryotherapy;Electrical Stimulation;Moist Heat;Gait training;Stair training;Functional mobility training;Therapeutic activities;Therapeutic exercise;Patient/family education;Neuromuscular re-education;Scar mobilization;Passive range of motion;Vasopneumatic Device;Taping    PT Next Visit Plan  manual, edema management, flexibility and strength       Patient will benefit from skilled therapeutic intervention in order to improve the following deficits and impairments:  Abnormal gait, Pain, Decreased mobility, Decreased scar mobility, Decreased strength, Decreased endurance,  Decreased activity tolerance, Decreased range of motion, Difficulty walking, Increased edema, Impaired flexibility  Visit Diagnosis: Localized edema  Stiffness of left knee, not elsewhere classified  Acute pain of left knee  Muscle weakness (generalized)  Other abnormalities of gait and mobility     Problem List Patient Active Problem List   Diagnosis Date Noted  . Primary osteoarthritis of left knee 03/22/2017  . S/P total knee replacement using cement, left 03/22/2017   Ruben Im, PT 04/29/17 10:01 AM Phone: [phone] Fax: [phone]  Alvera Singh 04/29/2017, 10:00 AM  [facility] [address], [place], [place], [address] Phone: [phone]   Fax:  [phone]  Name: Chad Flores MRN: [id number] Date of Birth: 12-23-1950

## 2017-05-02 ENCOUNTER — Ambulatory Visit: Payer: Medicare Other

## 2017-05-02 DIAGNOSIS — M25562 Pain in left knee: Secondary | ICD-10-CM

## 2017-05-02 DIAGNOSIS — M25662 Stiffness of left knee, not elsewhere classified: Secondary | ICD-10-CM

## 2017-05-02 DIAGNOSIS — R600 Localized edema: Secondary | ICD-10-CM

## 2017-05-02 DIAGNOSIS — R2689 Other abnormalities of gait and mobility: Secondary | ICD-10-CM | POA: Diagnosis not present

## 2017-05-02 DIAGNOSIS — M6281 Muscle weakness (generalized): Secondary | ICD-10-CM

## 2017-05-02 NOTE — Therapy (Signed)
[facility] [address] [place], [place], [address] Phone: [phone]   Fax:  [phone]  Physical Therapy Treatment  Patient Details  Name: Chad Flores MRN: [id number] Date of Birth: 07/28/1950 Referring Provider: [name], MD   Encounter Date: 05/02/2017  PT End of Session - 05/02/17 1012    Visit Number  9    Number of Visits  10    Date for PT Re-Evaluation  06/02/17    Authorization Type  Medicare/Tricare    PT Start Time  0920 10 min unattended on NuStep    PT Stop Time  1030    PT Time Calculation (min)  70 min    Activity Tolerance  Patient tolerated treatment well    Behavior During Therapy  [facility] for tasks assessed/performed       Past Medical History:  Diagnosis Date  . Anemia   . Anxiety   . COPD (chronic obstructive pulmonary disease) ([place])   . Osteoarthritis     Past Surgical History:  Procedure Laterality Date  . CHOLECYSTECTOMY    . ELBOW SURGERY    . KNEE ARTHROPLASTY    . KNEE SURGERY    . TOTAL KNEE ARTHROPLASTY Left 03/22/2017  . TOTAL KNEE ARTHROPLASTY Left 03/22/2017   Procedure: LEFT TOTAL KNEE ARTHROPLASTY;  Surgeon: Garald Balding, MD;  Location: [place];  Service: Orthopedics;  Laterality: Left;  . TRIGGER FINGER RELEASE      There were no vitals filed for this visit.  Subjective Assessment - 05/02/17 0936    Subjective  I am doing well.      Currently in Pain?  Yes    Pain Score  2     Pain Location  Knee    Pain Orientation  Left    Pain Descriptors / Indicators  Dull;Sore    Pain Type  Surgical pain    Pain Onset  1 to 4 weeks ago    Pain Frequency  Constant    Aggravating Factors   sitting too long, stretching the knee    Pain Relieving Factors  ice, rest, movement                      [facility] Adult PT Treatment/Exercise - 05/02/17 0001      Knee/Hip Exercises: Stretches   Active Hamstring Stretch  Left;3 reps;20 seconds       Knee/Hip Exercises: Aerobic   Nustep  L3 seat 9 to 6 for increased flexion Total 10 min      Knee/Hip Exercises: Machines for Strengthening   Cybex Knee Extension  20# bil. 2x10    Cybex Knee Flexion  20# bil 2x10    Total Gym Leg Press  seat 7 bil 70# 15x; left only 35# 15x      Knee/Hip Exercises: Standing   Step Down  Right;10 reps;Hand Hold: 2;Step Height: 4"    Rocker Board  3 minutes    Walking with Sports Cord  25# forward and reverse x 10 each      Knee/Hip Exercises: Supine   Other Supine Knee/Hip Exercises  rolling green ball 2x10      Vasopneumatic   Number Minutes Vasopneumatic   15 minutes    Vasopnuematic Location   Knee    Vasopneumatic Pressure  Medium    Vasopneumatic Temperature   15 minutes      Manual Therapy   Manual Therapy  Passive ROM;Edema management  Manual therapy comments  soft tissue elongation to distal quad and hamstring and proximal gastroc on the Lt.  mobilization of tissue around incision               PT Short Term Goals - 05/02/17 0942      PT SHORT TERM GOAL #4   Title  demonstrate 4/5 Lt knee strength to improve endurance and improve sit to stand    Status  Achieved        PT Long Term Goals - 04/07/17 1227      PT LONG TERM GOAL #1   Title  be independent in advanced HEP    Time  8    Period  Weeks    Status  New    Target Date  06/02/17      PT LONG TERM GOAL #2   Title  improve Lt LE strength to > or = to 4+/5 to allow for ascending steps with step-over step gait and perform sit to stand without UE support    Time  8    Period  Weeks    Status  New    Target Date  06/02/17      PT LONG TERM GOAL #3   Title  reduce FOTO to < or = to 39% limitation    Time  8    Period  Weeks    Status  New    Target Date  06/02/17      PT LONG TERM GOAL #4   Title  demonstrate Lt knee A/ROM flexion to > or = to 115 degrees to allow for squatting     Time  8    Period  Weeks    Status  New    Target Date  06/02/17       PT LONG TERM GOAL #5   Title  wean from cane > or = to 50% of the time in the community and demonstrate symmetry on level surfaces    Time  8    Period  Weeks    Status  New    Target Date  06/02/17      Additional Long Term Goals   Additional Long Term Goals  Yes      PT LONG TERM GOAL #6   Title  report < or = to 3/10 Lt knee pain with standing and walking to improve independence with community and home function    Time  8    Period  Weeks    Status  New    Target Date  06/02/17            Plan - 05/02/17 0942    Clinical Impression Statement  Pt with improving Lt knee A/ROM.  Pt with improved quad motor control with step-ups and single leg press.  Edema is improving with ice and elevation.  Pt is making steady gains s/p TKA and will continue to benefit from skilled PT for strength, A/ROM, gait and edema management.      Rehab Potential  Good    PT Frequency  3x / week    PT Duration  8 weeks    PT Treatment/Interventions  ADLs/Self Care Home Management;Cryotherapy;Electrical Stimulation;Moist Heat;Gait training;Stair training;Functional mobility training;Therapeutic activities;Therapeutic exercise;Patient/family education;Neuromuscular re-education;Scar mobilization;Passive range of motion;Vasopneumatic Device;Taping    PT Next Visit Plan  manual, edema management, flexibility and strength    Consulted and Agree with Plan of Care  Patient       Patient will  benefit from skilled therapeutic intervention in order to improve the following deficits and impairments:  Abnormal gait, Pain, Decreased mobility, Decreased scar mobility, Decreased strength, Decreased endurance, Decreased activity tolerance, Decreased range of motion, Difficulty walking, Increased edema, Impaired flexibility  Visit Diagnosis: Localized edema  Stiffness of left knee, not elsewhere classified  Acute pain of left knee  Muscle weakness (generalized)  Other abnormalities of gait and  mobility     Problem List Patient Active Problem List   Diagnosis Date Noted  . Primary osteoarthritis of left knee 03/22/2017  . S/P total knee replacement using cement, left 03/22/2017     Sigurd Sos, PT 05/02/17 10:14 AM   [facility] [address], [place], [place], [address] Phone: [phone]   Fax:  [phone]  Name: REG BIRCHER MRN: [id number] Date of Birth: May 25, 1950

## 2017-05-04 ENCOUNTER — Encounter: Payer: Medicare Other | Admitting: Physical Therapy

## 2017-05-06 ENCOUNTER — Encounter: Payer: Self-pay | Admitting: Physical Therapy

## 2017-05-06 ENCOUNTER — Ambulatory Visit: Payer: Medicare Other | Admitting: Physical Therapy

## 2017-05-06 DIAGNOSIS — R600 Localized edema: Secondary | ICD-10-CM | POA: Diagnosis not present

## 2017-05-06 DIAGNOSIS — M25562 Pain in left knee: Secondary | ICD-10-CM

## 2017-05-06 DIAGNOSIS — R2689 Other abnormalities of gait and mobility: Secondary | ICD-10-CM | POA: Diagnosis not present

## 2017-05-06 DIAGNOSIS — M6281 Muscle weakness (generalized): Secondary | ICD-10-CM | POA: Diagnosis not present

## 2017-05-06 DIAGNOSIS — M25662 Stiffness of left knee, not elsewhere classified: Secondary | ICD-10-CM | POA: Diagnosis not present

## 2017-05-06 NOTE — Therapy (Signed)
[facility] [address], [place], [place], [address] Phone: [phone]   Fax:  [phone]  Physical Therapy Treatment  Patient Details  Name: Chad Flores MRN: [id number] Date of Birth: 02/14/1951 Referring Provider: [name], MD   Encounter Date: 05/06/2017  PT End of Session - 05/06/17 1126    Visit Number  10    Number of Visits  20    Date for PT Re-Evaluation  06/02/17    Authorization Type  Medicare/Tricare    PT Start Time  1045    PT Stop Time  1150    PT Time Calculation (min)  65 min    Activity Tolerance  Patient tolerated treatment well       Past Medical History:  Diagnosis Date  . Anemia   . Anxiety   . COPD (chronic obstructive pulmonary disease) ([place])   . Osteoarthritis     Past Surgical History:  Procedure Laterality Date  . CHOLECYSTECTOMY    . ELBOW SURGERY    . KNEE ARTHROPLASTY    . KNEE SURGERY    . TOTAL KNEE ARTHROPLASTY Left 03/22/2017  . TOTAL KNEE ARTHROPLASTY Left 03/22/2017   Procedure: LEFT TOTAL KNEE ARTHROPLASTY;  Surgeon: Garald Balding, MD;  Location: [place];  Service: Orthopedics;  Laterality: Left;  . TRIGGER FINGER RELEASE      There were no vitals filed for this visit.  Subjective Assessment - 05/06/17 1109    Subjective  The sharper pain has gotten better.  On average the knee pain is 2/10.   Not painful at rest.      Pertinent History  Lt TKA: 03/22/17;  MD early Jan    Currently in Pain?  Yes    Pain Score  2     Pain Location  Knee    Pain Orientation  Left    Aggravating Factors   stretching the knee         [facility] PT Assessment - 05/06/17 0001      Observation/Other Assessments   Focus on Therapeutic Outcomes (FOTO)   45% limitation      AROM   Left Knee Extension  3    Left Knee Flexion  100 supine      Strength   Left Knee Flexion  4/5    Left Knee Extension  4/5                  [facility] Adult PT  Treatment/Exercise - 05/06/17 0001      Knee/Hip Exercises: Stretches   Sports administrator  Left;5 reps on 2nd step      Knee/Hip Exercises: Aerobic   Nustep  L3 seat 9 to 6 for increased flexion Total 10 min      Knee/Hip Exercises: Machines for Strengthening   Cybex Knee Flexion  20# bil 2x10    Total Gym Leg Press  seat 7 bil 80# 15x; left only 40# 15x      Knee/Hip Exercises: Standing   Forward Step Up  Left;10 reps;Hand Hold: 2;Step Height: 6"    Step Down  Right;10 reps;Hand Hold: 2;Step Height: 4"    Walking with Sports Cord  25# forward and reverse x 10 each      Knee/Hip Exercises: Supine   Other Supine Knee/Hip Exercises  rolling green ball 2x10      Vasopneumatic   Number Minutes Vasopneumatic   15 minutes    Vasopnuematic Location   Knee  Vasopneumatic Pressure  Medium    Vasopneumatic Temperature   15 minutes      Manual Therapy   Manual therapy comments  soft tissue elongation to distal quad and hamstring and proximal gastroc on the Lt.  mobilization of tissue around incision    Joint Mobilization  gentle distraction 5x    Passive ROM  knee flexion in sitting and supine 10x               PT Short Term Goals - 05/06/17 1144      PT SHORT TERM GOAL #1   Title  be independent in initial HEP    Status  Achieved      PT SHORT TERM GOAL #2   Title  ambulate with full weight bearing on the Lt and use cane for household distances    Status  Achieved      PT SHORT TERM GOAL #3   Title  demonstrate Lt knee A/ROM flexion to > or = to 95 degrees to allow for sitting without substitution    Status  Achieved      PT SHORT TERM GOAL #4   Title  demonstrate 4/5 Lt knee strength to improve endurance and improve sit to stand    Status  Achieved        PT Long Term Goals - 04/07/17 1227      PT LONG TERM GOAL #1   Title  be independent in advanced HEP    Time  8    Period  Weeks    Status  New    Target Date  06/02/17      PT LONG TERM GOAL #2   Title   improve Lt LE strength to > or = to 4+/5 to allow for ascending steps with step-over step gait and perform sit to stand without UE support    Time  8    Period  Weeks    Status  New    Target Date  06/02/17      PT LONG TERM GOAL #3   Title  reduce FOTO to < or = to 39% limitation    Time  8    Period  Weeks    Status  New    Target Date  06/02/17      PT LONG TERM GOAL #4   Title  demonstrate Lt knee A/ROM flexion to > or = to 115 degrees to allow for squatting     Time  8    Period  Weeks    Status  New    Target Date  06/02/17      PT LONG TERM GOAL #5   Title  wean from cane > or = to 50% of the time in the community and demonstrate symmetry on level surfaces    Time  8    Period  Weeks    Status  New    Target Date  06/02/17      Additional Long Term Goals   Additional Long Term Goals  Yes      PT LONG TERM GOAL #6   Title  report < or = to 3/10 Lt knee pain with standing and walking to improve independence with community and home function    Time  8    Period  Weeks    Status  New    Target Date  06/02/17            Plan - 05/06/17 1136  Clinical Impression Statement  The patient continues to make steady progress in knee ROM (100 degrees in supine) and strength.  He will need additional ROM and strength to be able to descend steps reciprocally.   Able to increase resistance on leg press.   Good improvement in FOTO score.      PT Frequency  3x / week    PT Duration  8 weeks    PT Treatment/Interventions  ADLs/Self Care Home Management;Cryotherapy;Electrical Stimulation;Moist Heat;Gait training;Stair training;Functional mobility training;Therapeutic activities;Therapeutic exercise;Patient/family education;Neuromuscular re-education;Scar mobilization;Passive range of motion;Vasopneumatic Device;Taping    PT Next Visit Plan  manual, edema management, flexibility and strength       Patient will benefit from skilled therapeutic intervention in order to improve  the following deficits and impairments:  Abnormal gait, Pain, Decreased mobility, Decreased scar mobility, Decreased strength, Decreased endurance, Decreased activity tolerance, Decreased range of motion, Difficulty walking, Increased edema, Impaired flexibility  Visit Diagnosis: Localized edema  Stiffness of left knee, not elsewhere classified  Acute pain of left knee  Muscle weakness (generalized)  Other abnormalities of gait and mobility   G-Codes - May 23, 2017 1136    Functional Assessment Tool Used (Outpatient Only)  FOTO    Functional Limitation  Mobility: Walking and moving around    Mobility: Walking and Moving Around Current Status [phone])  At least 40 percent but less than 60 percent impaired, limited or restricted    Mobility: Walking and Moving Around Goal Status [phone])  At least 20 percent but less than 40 percent impaired, limited or restricted       Problem List Patient Active Problem List   Diagnosis Date Noted  . Primary osteoarthritis of left knee 03/22/2017  . S/P total knee replacement using cement, left 03/22/2017   Ruben Im, PT 2017-05-23 11:47 AM Phone: [phone] Fax: [phone]  Alvera Singh 05-23-17, 11:46 AM  [facility] [address] [place], [place], [address] Phone: [phone]   Fax:  [phone]  Name: MERRIL NAGY MRN: [id number] Date of Birth: May 04, 1951

## 2017-05-12 ENCOUNTER — Ambulatory Visit: Payer: Medicare Other | Attending: Family Medicine | Admitting: Physical Therapy

## 2017-05-12 ENCOUNTER — Encounter: Payer: Self-pay | Admitting: Physical Therapy

## 2017-05-12 DIAGNOSIS — M25562 Pain in left knee: Secondary | ICD-10-CM | POA: Diagnosis not present

## 2017-05-12 DIAGNOSIS — M25662 Stiffness of left knee, not elsewhere classified: Secondary | ICD-10-CM

## 2017-05-12 DIAGNOSIS — R600 Localized edema: Secondary | ICD-10-CM

## 2017-05-12 DIAGNOSIS — M6281 Muscle weakness (generalized): Secondary | ICD-10-CM

## 2017-05-12 DIAGNOSIS — R2689 Other abnormalities of gait and mobility: Secondary | ICD-10-CM | POA: Diagnosis not present

## 2017-05-12 NOTE — Therapy (Signed)
[facility] [address], [place], [place], [address] Phone: [phone]   Fax:  [phone]  Physical Therapy Treatment  Patient Details  Name: Chad Flores MRN: [id number] Date of Birth: 02-Nov-1950 Referring Provider: [name], MD   Encounter Date: 05/12/2017  PT End of Session - 05/12/17 1100    Visit Number  11    Number of Visits  20    Date for PT Re-Evaluation  06/02/17    Authorization Type  Medicare/Tricare    PT Start Time  1055    PT Stop Time  1150    PT Time Calculation (min)  55 min    Activity Tolerance  Patient tolerated treatment well;No increased pain    Behavior During Therapy  WFL for tasks assessed/performed       Past Medical History:  Diagnosis Date  . Anemia   . Anxiety   . COPD (chronic obstructive pulmonary disease) ([place])   . Osteoarthritis     Past Surgical History:  Procedure Laterality Date  . CHOLECYSTECTOMY    . ELBOW SURGERY    . KNEE ARTHROPLASTY    . KNEE SURGERY    . TOTAL KNEE ARTHROPLASTY Left 03/22/2017  . TOTAL KNEE ARTHROPLASTY Left 03/22/2017   Procedure: LEFT TOTAL KNEE ARTHROPLASTY;  Surgeon: Garald Balding, MD;  Location: [place];  Service: Orthopedics;  Laterality: Left;  . TRIGGER FINGER RELEASE      There were no vitals filed for this visit.  Subjective Assessment - 05/12/17 1056    Subjective  Pt states things are going well. He has overall stiffness in the knee, but just the general 2 or 3 pain.     Pertinent History  Lt TKA: 03/22/17;  MD early Jan    Currently in Pain?  Yes    Pain Score  2     Pain Location  Knee    Pain Orientation  Left    Pain Descriptors / Indicators  Sore    Pain Type  Surgical pain    Pain Onset  1 to 4 weeks ago    Pain Frequency  Constant    Aggravating Factors   alot of stretching    Pain Relieving Factors  ice, rest, massage                [facility] Adult PT Treatment/Exercise - 05/12/17 0001       Knee/Hip Exercises: Aerobic   Nustep  L3, seat 5 x5 min with therapist present to discuss progress      Knee/Hip Exercises: Machines for Strengthening   Total Gym Leg Press  seat 7 bil 80# 2x15; left only 40# 15x      Vasopneumatic   Number Minutes Vasopneumatic   15 minutes    Vasopnuematic Location   Knee    Vasopneumatic Pressure  Medium    Vasopneumatic Temperature   15 minutes      Manual Therapy   Manual Therapy  Soft tissue mobilization    Joint Mobilization  MWM medial tibial glide during active assisted Lt knee flexion 2x10 reps; PA proximal fibular mobilization; Patella mobilization all directions     Soft tissue mobilization  IASTM Lt quadriceps    Passive ROM  Lt knee extension with lateral tibial rotation component              PT Education - 05/12/17 1100    Education provided  Yes    Education Details  implications for  manual techniques; self massage at home     Person(s) Educated  Patient    Methods  Explanation    Comprehension  Verbalized understanding       PT Short Term Goals - 05/06/17 1144      PT SHORT TERM GOAL #1   Title  be independent in initial HEP    Status  Achieved      PT SHORT TERM GOAL #2   Title  ambulate with full weight bearing on the Lt and use cane for household distances    Status  Achieved      PT SHORT TERM GOAL #3   Title  demonstrate Lt knee A/ROM flexion to > or = to 95 degrees to allow for sitting without substitution    Status  Achieved      PT SHORT TERM GOAL #4   Title  demonstrate 4/5 Lt knee strength to improve endurance and improve sit to stand    Status  Achieved        PT Long Term Goals - 04/07/17 1227      PT LONG TERM GOAL #1   Title  be independent in advanced HEP    Time  8    Period  Weeks    Status  New    Target Date  06/02/17      PT LONG TERM GOAL #2   Title  improve Lt LE strength to > or = to 4+/5 to allow for ascending steps with step-over step gait and perform sit to stand without  UE support    Time  8    Period  Weeks    Status  New    Target Date  06/02/17      PT LONG TERM GOAL #3   Title  reduce FOTO to < or = to 39% limitation    Time  8    Period  Weeks    Status  New    Target Date  06/02/17      PT LONG TERM GOAL #4   Title  demonstrate Lt knee A/ROM flexion to > or = to 115 degrees to allow for squatting     Time  8    Period  Weeks    Status  New    Target Date  06/02/17      PT LONG TERM GOAL #5   Title  wean from cane > or = to 50% of the time in the community and demonstrate symmetry on level surfaces    Time  8    Period  Weeks    Status  New    Target Date  06/02/17      Additional Long Term Goals   Additional Long Term Goals  Yes      PT LONG TERM GOAL #6   Title  report < or = to 3/10 Lt knee pain with standing and walking to improve independence with community and home function    Time  8    Period  Weeks    Status  New    Target Date  06/02/17            Plan - 05/12/17 1329    Clinical Impression Statement  Continued this session with therex to promote Lt knee strength. Completed more manual techniques to address noted restrictions along the soft tissue and joint surrounding the Lt knee. Pt reported no increase in pain and demonstrated Lt knee flexion AROM to 105 deg  by the end of today's session. Will continue with current POC.     PT Frequency  3x / week    PT Duration  8 weeks    PT Treatment/Interventions  ADLs/Self Care Home Management;Cryotherapy;Electrical Stimulation;Moist Heat;Gait training;Stair training;Functional mobility training;Therapeutic activities;Therapeutic exercise;Patient/family education;Neuromuscular re-education;Scar mobilization;Passive range of motion;Vasopneumatic Device;Taping    PT Next Visit Plan  manual to Lt knee, edema management, flexibility and strength    Consulted and Agree with Plan of Care  Patient       Patient will benefit from skilled therapeutic intervention in order to  improve the following deficits and impairments:  Abnormal gait, Pain, Decreased mobility, Decreased scar mobility, Decreased strength, Decreased endurance, Decreased activity tolerance, Decreased range of motion, Difficulty walking, Increased edema, Impaired flexibility  Visit Diagnosis: Localized edema  Stiffness of left knee, not elsewhere classified  Acute pain of left knee  Muscle weakness (generalized)  Other abnormalities of gait and mobility     Problem List Patient Active Problem List   Diagnosis Date Noted  . Primary osteoarthritis of left knee 03/22/2017  . S/P total knee replacement using cement, left 03/22/2017    1:34 PM,05/12/17 Elly Modena PT, DPT [place] [facility] [address] [place], [place], [address] Phone: [phone]   Fax:  [phone]  Name: Chad Flores MRN: [id number] Date of Birth: November 20, 1950

## 2017-05-13 ENCOUNTER — Ambulatory Visit: Payer: Medicare Other | Admitting: Physical Therapy

## 2017-05-13 ENCOUNTER — Encounter: Payer: Self-pay | Admitting: Physical Therapy

## 2017-05-13 DIAGNOSIS — M6281 Muscle weakness (generalized): Secondary | ICD-10-CM | POA: Diagnosis not present

## 2017-05-13 DIAGNOSIS — M25662 Stiffness of left knee, not elsewhere classified: Secondary | ICD-10-CM

## 2017-05-13 DIAGNOSIS — R2689 Other abnormalities of gait and mobility: Secondary | ICD-10-CM | POA: Diagnosis not present

## 2017-05-13 DIAGNOSIS — R600 Localized edema: Secondary | ICD-10-CM

## 2017-05-13 DIAGNOSIS — M25562 Pain in left knee: Secondary | ICD-10-CM | POA: Diagnosis not present

## 2017-05-13 NOTE — Therapy (Signed)
[facility] [address] [place], [place], [address] Phone: [phone]   Fax:  [phone]  Physical Therapy Treatment  Patient Details  Name: Chad Flores MRN: [id number] Date of Birth: 18-Sep-1950 Referring Provider: [name], MD   Encounter Date: 05/13/2017  PT End of Session - 05/13/17 1144    Visit Number  12    Number of Visits  20    Date for PT Re-Evaluation  06/02/17    Authorization Type  Medicare/Tricare    PT Start Time  1100    PT Stop Time  1155    PT Time Calculation (min)  55 min    Activity Tolerance  Patient tolerated treatment well;No increased pain    Behavior During Therapy  WFL for tasks assessed/performed       Past Medical History:  Diagnosis Date  . Anemia   . Anxiety   . COPD (chronic obstructive pulmonary disease) ([place])   . Osteoarthritis     Past Surgical History:  Procedure Laterality Date  . CHOLECYSTECTOMY    . ELBOW SURGERY    . KNEE ARTHROPLASTY    . KNEE SURGERY    . TOTAL KNEE ARTHROPLASTY Left 03/22/2017  . TOTAL KNEE ARTHROPLASTY Left 03/22/2017   Procedure: LEFT TOTAL KNEE ARTHROPLASTY;  Surgeon: Garald Balding, MD;  Location: [place];  Service: Orthopedics;  Laterality: Left;  . TRIGGER FINGER RELEASE      There were no vitals filed for this visit.  Subjective Assessment - 05/13/17 1104    Subjective  the weather increases the pain in my knee.     Limitations  Standing;Walking    How long can you stand comfortably?  Rt>Lt weightbearing    How long can you walk comfortably?  15 minutes with fatigue.  Using walker and 50% WB on the Lt    Patient Stated Goals  improve gait, reduce pain, improve Lt knee A/ROM, endurance    Currently in Pain?  Yes    Pain Score  4     Pain Location  Knee    Pain Orientation  Left    Pain Descriptors / Indicators  Sore    Pain Type  Surgical pain    Pain Onset  1 to 4 weeks ago    Pain Frequency  Constant    Aggravating Factors   alot of stretching    Pain Relieving Factors  ice, rest, massage    Multiple Pain Sites  No         [facility] PT Assessment - 05/13/17 0001      PROM   Left Knee Extension  3    Left Knee Flexion  100                  [facility] Adult PT Treatment/Exercise - 05/13/17 0001      Knee/Hip Exercises: Aerobic   Stationary Bike  5 min       Knee/Hip Exercises: Machines for Strengthening   Cybex Knee Flexion  20# bil 2x10    Total Gym Leg Press  seat 7 bil 80# 2x15; left only 40# 15x      Knee/Hip Exercises: Standing   Forward Step Up  Left;10 reps;Hand Hold: 2;Step Height: 6"    Step Down  10 reps;Hand Hold: 2;Step Height: 4";Left    Walking with Sports Cord  25# forward and reverse , bil. sides x 5 each      Modalities   Modalities  Vasopneumatic      Vasopneumatic   Number Minutes Vasopneumatic   15 minutes    Vasopnuematic Location   Knee    Vasopneumatic Pressure  Medium    Vasopneumatic Temperature   15 minutes      Manual Therapy   Manual Therapy  Joint mobilization    Joint Mobilization  MWM medial tibial glide during active assisted Lt knee flexion 2x10 reps; PA proximal fibular mobilization; Patella mobilization all directions              PT Education - 05/12/17 1100    Education provided  Yes    Education Details  implications for manual techniques; self massage at home     Person(s) Educated  Patient    Methods  Explanation    Comprehension  Verbalized understanding       PT Short Term Goals - 05/06/17 1144      PT SHORT TERM GOAL #1   Title  be independent in initial HEP    Status  Achieved      PT [place] #2   Title  ambulate with full weight bearing on the Lt and use cane for household distances    Status  Achieved      PT SHORT TERM GOAL #3   Title  demonstrate Lt knee A/ROM flexion to > or = to 95 degrees to allow for sitting without substitution    Status  Achieved      PT SHORT TERM GOAL #4   Title   demonstrate 4/5 Lt knee strength to improve endurance and improve sit to stand    Status  Achieved        PT Long Term Goals - 05/13/17 1150      PT LONG TERM GOAL #1   Title  be independent in advanced HEP    Baseline  still learning    Time  8    Period  Weeks    Status  On-going      PT LONG TERM GOAL #2   Title  improve Lt LE strength to > or = to 4+/5 to allow for ascending steps with step-over step gait and perform sit to stand without UE support    Time  8    Period  Weeks    Status  On-going      PT LONG TERM GOAL #3   Title  reduce FOTO to < or = to 39% limitation    Time  8    Period  Weeks    Status  New      PT LONG TERM GOAL #4   Title  demonstrate Lt knee A/ROM flexion to > or = to 115 degrees to allow for squatting     Baseline  left knee flexion PROM 100    Time  8    Period  Weeks    Status  On-going            Plan - 05/13/17 1117    Clinical Impression Statement  Patient still has a limp on the left.  Patient left knee PROM -3-100.  Patient has tightness in left quadriceps.  Patient did well on the recumbent bike. Patient needs verbal cues to not  swing left leg around the step.  Patient will benefit from skilled therapy to work on left knee ROM and stength while working on mobility.     Rehab Potential  Good    Clinical Impairments Affecting Rehab Potential  left knee TKR 03/22/2017    PT Frequency  3x / week    PT Duration  8 weeks    PT Treatment/Interventions  ADLs/Self Care Home Management;Cryotherapy;Electrical Stimulation;Moist Heat;Gait training;Stair training;Functional mobility training;Therapeutic activities;Therapeutic exercise;Patient/family education;Neuromuscular re-education;Scar mobilization;Passive range of motion;Vasopneumatic Device;Taping    PT Next Visit Plan  manual to Lt knee, edema management, flexibility and strength; vaso at end of treatment    PT Home Exercise Plan  progress as needed    Consulted and Agree with Plan of  Care  Patient       Patient will benefit from skilled therapeutic intervention in order to improve the following deficits and impairments:  Abnormal gait, Pain, Decreased mobility, Decreased scar mobility, Decreased strength, Decreased endurance, Decreased activity tolerance, Decreased range of motion, Difficulty walking, Increased edema, Impaired flexibility  Visit Diagnosis: Localized edema  Stiffness of left knee, not elsewhere classified  Acute pain of left knee  Muscle weakness (generalized)  Other abnormalities of gait and mobility     Problem List Patient Active Problem List   Diagnosis Date Noted  . Primary osteoarthritis of left knee 03/22/2017  . S/P total knee replacement using cement, left 03/22/2017    Earlie Counts, PT 05/13/17 11:52 AM   [facility] [address], [place], [place], [address] Phone: [phone]   Fax:  [phone]  Name: Chad Flores MRN: [id number] Date of Birth: 20-Sep-1950

## 2017-05-16 ENCOUNTER — Ambulatory Visit: Payer: Medicare Other

## 2017-05-16 ENCOUNTER — Encounter (INDEPENDENT_AMBULATORY_CARE_PROVIDER_SITE_OTHER): Payer: Self-pay | Admitting: Orthopaedic Surgery

## 2017-05-16 ENCOUNTER — Ambulatory Visit (INDEPENDENT_AMBULATORY_CARE_PROVIDER_SITE_OTHER): Payer: Medicare Other | Admitting: Orthopaedic Surgery

## 2017-05-16 VITALS — BP 97/65 | HR 87 | Resp 14 | Ht 70.0 in | Wt 185.0 lb

## 2017-05-16 DIAGNOSIS — R600 Localized edema: Secondary | ICD-10-CM | POA: Diagnosis not present

## 2017-05-16 DIAGNOSIS — M25662 Stiffness of left knee, not elsewhere classified: Secondary | ICD-10-CM | POA: Diagnosis not present

## 2017-05-16 DIAGNOSIS — M6281 Muscle weakness (generalized): Secondary | ICD-10-CM

## 2017-05-16 DIAGNOSIS — R2689 Other abnormalities of gait and mobility: Secondary | ICD-10-CM

## 2017-05-16 DIAGNOSIS — M25562 Pain in left knee: Secondary | ICD-10-CM | POA: Diagnosis not present

## 2017-05-16 DIAGNOSIS — Z96652 Presence of left artificial knee joint: Secondary | ICD-10-CM

## 2017-05-16 NOTE — Progress Notes (Signed)
Office Visit Note   Patient: Chad Flores           Date of Birth: 03-17-1951           MRN: [id number] Visit Date: 05/16/2017              Requested by: Darcus Austin, MD [place], [place] [address] PCP: Darcus Austin, MD   Assessment & Plan: Visit Diagnoses:  1. History of left knee replacement     Plan: 6 weeks status post primary left total knee replacement doing well. No ambulatory aid. Needs to work on knee flexion and strengthening. We'll see in 6 weeks  Follow-Up Instructions: Return in about 6 weeks (around 06/27/2017).   Orders:  No orders of the defined types were placed in this encounter.  No orders of the defined types were placed in this encounter.     Procedures: No procedures performed   Clinical Data: No additional findings.   Subjective: Chief Complaint  Patient presents with  . Left Knee - Routine Post Op, Edema    Chad Flores is a 67 y o S/P 6 weeks Left TKA. He realtes localized swelling but doing well with PT.  No related fever or chills. Working with therapy for with range of motion and strengthening. Happy with present progress  HPI  Review of Systems  Constitutional: Negative for fatigue.  HENT: Negative for hearing loss.   Respiratory: Negative for apnea, chest tightness and shortness of breath.   Cardiovascular: Negative for chest pain, palpitations and leg swelling.  Gastrointestinal: Negative for blood in stool, constipation and diarrhea.  Genitourinary: Negative for difficulty urinating.  Musculoskeletal: Positive for arthralgias. Negative for back pain, joint swelling, myalgias, neck pain and neck stiffness.  Neurological: Positive for weakness. Negative for numbness and headaches.  Hematological: Does not bruise/bleed easily.  Psychiatric/Behavioral: Negative for sleep disturbance. The patient is not nervous/anxious.      Objective: Vital Signs: BP 97/65   Pulse 87   Resp 14   Ht 5\' 10"   (1.778 m)   Wt 185 lb (83.9 kg)   BMI 26.54 kg/m   Physical Exam  Ortho Exam awake alert and oriented 3. Comfortable sitting. No instability with range of motion of left knee. About 100 of flexion and just about full knee extension. Minimal effusion. Calf pain. Neurovascular exam intact.  Specialty Comments:  No specialty comments available.  Imaging: No results found.   PMFS History: Patient Active Problem List   Diagnosis Date Noted  . Primary osteoarthritis of left knee 03/22/2017  . S/P total knee replacement using cement, left 03/22/2017   Past Medical History:  Diagnosis Date  . Anemia   . Anxiety   . COPD (chronic obstructive pulmonary disease) ([place])   . Osteoarthritis     Family History  Problem Relation Age of Onset  . Alzheimer's disease Mother   . Cancer Father     Past Surgical History:  Procedure Laterality Date  . CHOLECYSTECTOMY    . ELBOW SURGERY    . KNEE ARTHROPLASTY    . KNEE SURGERY    . TOTAL KNEE ARTHROPLASTY Left 03/22/2017  . TOTAL KNEE ARTHROPLASTY Left 03/22/2017   Procedure: LEFT TOTAL KNEE ARTHROPLASTY;  Surgeon: Garald Balding, MD;  Location: [place];  Service: Orthopedics;  Laterality: Left;  . TRIGGER FINGER RELEASE     Social History   Occupational History  . Not on file  Tobacco Use  . Smoking status:  Former Smoker    Packs/day: 1.00    Years: 52.50    Pack years: 52.50    Types: Cigarettes  . Smokeless tobacco: Never Used  . Tobacco comment: Now uses e cigarettes. Encouraged to quit smoking completely.  Substance and Sexual Activity  . Alcohol use: Yes    Alcohol/week: 0.6 oz    Types: 1 Standard drinks or equivalent per week    Comment: occ  . Drug use: No  . Sexual activity: Not on file

## 2017-05-16 NOTE — Therapy (Signed)
[facility] [address], [place], [place], [address] Phone: [phone]   Fax:  [phone]  Physical Therapy Treatment  Patient Details  Name: Chad Flores MRN: [id number] Date of Birth: Jun 18, 1950 Referring Provider: [name], MD   Encounter Date: 05/16/2017  PT End of Session - 05/16/17 0922    Visit Number  13    Date for PT Re-Evaluation  06/27/17    Authorization Type  Medicare/Tricare    PT Start Time  0835    PT Stop Time  0936    PT Time Calculation (min)  61 min    Activity Tolerance  Patient tolerated treatment well    Behavior During Therapy  [facility] for tasks assessed/performed       Past Medical History:  Diagnosis Date  . Anemia   . Anxiety   . COPD (chronic obstructive pulmonary disease) ([place])   . Osteoarthritis     Past Surgical History:  Procedure Laterality Date  . CHOLECYSTECTOMY    . ELBOW SURGERY    . KNEE ARTHROPLASTY    . KNEE SURGERY    . TOTAL KNEE ARTHROPLASTY Left 03/22/2017  . TOTAL KNEE ARTHROPLASTY Left 03/22/2017   Procedure: LEFT TOTAL KNEE ARTHROPLASTY;  Surgeon: Garald Balding, MD;  Location: [place];  Service: Orthopedics;  Laterality: Left;  . TRIGGER FINGER RELEASE      There were no vitals filed for this visit.  Subjective Assessment - 05/16/17 0852    Subjective  I was able to ride our bike at home.  Going to MD today.     Currently in Pain?  Yes    Pain Score  2     Pain Location  Knee    Pain Orientation  Left    Pain Descriptors / Indicators  Sore    Pain Type  Surgical pain    Pain Onset  More than a month ago    Pain Frequency  Intermittent    Aggravating Factors   being on it, stretching    Pain Relieving Factors  ice, rest, massage         [facility] PT Assessment - 05/16/17 0001      Assessment   Medical Diagnosis  s/p Lt TKA      Cognition   Overall Cognitive Status  Within Functional Limits for tasks assessed      Observation/Other Assessments   Focus on Therapeutic Outcomes (FOTO)   45% limitation      Circumferential Edema   Circumferential - Left   16.5 in      AROM   Left Knee Extension  9    Left Knee Flexion  100      PROM   Left Knee Extension  3    Left Knee Flexion  100      Strength   Left Knee Flexion  4+/5    Left Knee Extension  4+/5      Ambulation/Gait   Ambulation/Gait  Yes    Gait Pattern  Step-through pattern;Decreased stance time - left                  [facility] Adult PT Treatment/Exercise - 05/16/17 0001      Knee/Hip Exercises: Aerobic   Nustep  L3, seat 5 x10 min       Knee/Hip Exercises: Machines for Strengthening   Cybex Knee Flexion  20# bil 2x10    Total Gym Leg Press  seat 7 bil 90#  2x15; left only 40# 15x      Knee/Hip Exercises: Standing   Walking with Sports Cord  25# forward and reverse , bil. sides x 15 each      Modalities   Modalities  Vasopneumatic      Vasopneumatic   Number Minutes Vasopneumatic   15 minutes    Vasopnuematic Location   Knee    Vasopneumatic Pressure  Medium    Vasopneumatic Temperature   15 minutes               PT Short Term Goals - 05/06/17 1144      PT SHORT TERM GOAL #1   Title  be independent in initial HEP    Status  Achieved      PT SHORT TERM GOAL #2   Title  ambulate with full weight bearing on the Lt and use cane for household distances    Status  Achieved      PT SHORT TERM GOAL #3   Title  demonstrate Lt knee A/ROM flexion to > or = to 95 degrees to allow for sitting without substitution    Status  Achieved      PT SHORT TERM GOAL #4   Title  demonstrate 4/5 Lt knee strength to improve endurance and improve sit to stand    Status  Achieved        PT Long Term Goals - 05/16/17 0856      PT LONG TERM GOAL #1   Title  be independent in advanced HEP    Time  8    Period  Weeks    Status  On-going      PT LONG TERM GOAL #2   Title  improve Lt LE strength to > or = to 5/5 to  allow for ascending steps with step-over step gait and perform sit to stand without UE support    Baseline  4+/5    Time  8    Period  Weeks    Status  Revised      PT LONG TERM GOAL #3   Title  reduce FOTO to < or = to 39% limitation    Baseline  45% limitation    Time  8    Period  Weeks    Status  On-going      PT LONG TERM GOAL #4   Title  demonstrate Lt knee A/ROM flexion to > or = to 115 degrees to allow for squatting     Baseline  left knee flexion PROM 100    Period  Weeks    Status  On-going      PT LONG TERM GOAL #5   Title  wean from cane > or = to 50% of the time in the community and demonstrate symmetry on level surfaces    Status  Achieved      PT LONG TERM GOAL #6   Title  report < or = to 3/10 Lt knee pain with standing and walking to improve independence with community and home function    Baseline  2-4/10    Time  8    Period  Weeks    Status  On-going            Plan - 05/16/17 0905    Clinical Impression Statement  Pt is making steady progress s/p Lt TKA.  Pt with Lt knee P/ROM 3-100 degrees.  Edema is improved to 16.5 inches at mid patella (improved from 17.5 inches).  Pt with 4+/5 Lt knee strength.  Pt with gait abnormality due to limited A/ROM.  Pt will continue to benefit from skilled PT for Lt knee and hip strength, flexibility progression, gait training, proprioception and edema management.      Rehab Potential  Good    Clinical Impairments Affecting Rehab Potential  left knee TKR 03/22/2017    PT Frequency  3x / week    PT Duration  6 weeks    PT Treatment/Interventions  ADLs/Self Care Home Management;Cryotherapy;Electrical Stimulation;Moist Heat;Gait training;Stair training;Functional mobility training;Therapeutic activities;Therapeutic exercise;Patient/family education;Neuromuscular re-education;Scar mobilization;Passive range of motion;Vasopneumatic Device;Taping    PT Next Visit Plan  manual to Lt knee, edema management, flexibility and  strength; vaso at end of treatment.  See what MD says    Recommended Other Services  initial certification is signed, recert sent 01/13/77    Consulted and Agree with Plan of Care  Patient       Patient will benefit from skilled therapeutic intervention in order to improve the following deficits and impairments:  Abnormal gait, Pain, Decreased mobility, Decreased scar mobility, Decreased strength, Decreased endurance, Decreased activity tolerance, Decreased range of motion, Difficulty walking, Increased edema, Impaired flexibility  Visit Diagnosis: Localized edema - Plan: PT plan of care cert/re-cert  Stiffness of left knee, not elsewhere classified - Plan: PT plan of care cert/re-cert  Acute pain of left knee - Plan: PT plan of care cert/re-cert  Muscle weakness (generalized) - Plan: PT plan of care cert/re-cert  Other abnormalities of gait and mobility - Plan: PT plan of care cert/re-cert     Problem List Patient Active Problem List   Diagnosis Date Noted  . Primary osteoarthritis of left knee 03/22/2017  . S/P total knee replacement using cement, left 03/22/2017    Chad Flores, PT 05/16/17 9:29 AM  [facility] [address] [place], [place], [address] Phone: [phone]   Fax:  [phone]  Name: Chad Flores MRN: [id number] Date of Birth: 08/22/1950

## 2017-05-18 ENCOUNTER — Ambulatory Visit: Payer: Medicare Other

## 2017-05-18 DIAGNOSIS — R2689 Other abnormalities of gait and mobility: Secondary | ICD-10-CM | POA: Diagnosis not present

## 2017-05-18 DIAGNOSIS — M25562 Pain in left knee: Secondary | ICD-10-CM | POA: Diagnosis not present

## 2017-05-18 DIAGNOSIS — M6281 Muscle weakness (generalized): Secondary | ICD-10-CM | POA: Diagnosis not present

## 2017-05-18 DIAGNOSIS — R600 Localized edema: Secondary | ICD-10-CM | POA: Diagnosis not present

## 2017-05-18 DIAGNOSIS — M25662 Stiffness of left knee, not elsewhere classified: Secondary | ICD-10-CM

## 2017-05-18 NOTE — Therapy (Signed)
[facility] [address], [place], [place], [address] Phone: [phone]   Fax:  [phone]  Physical Therapy Treatment  Patient Details  Name: Chad Flores MRN: [id number] Date of Birth: Sep 12, 1950 Referring Provider: [name], MD   Encounter Date: 05/18/2017  PT End of Session - 05/18/17 1149    Visit Number  14    Date for PT Re-Evaluation  06/27/17    Authorization Type  Medicare/Tricare    PT Start Time  1050 10 min on NuStep unattended    PT Stop Time  1200    PT Time Calculation (min)  70 min    Activity Tolerance  Patient tolerated treatment well    Behavior During Therapy  [facility] for tasks assessed/performed       Past Medical History:  Diagnosis Date  . Anemia   . Anxiety   . COPD (chronic obstructive pulmonary disease) ([place])   . Osteoarthritis     Past Surgical History:  Procedure Laterality Date  . CHOLECYSTECTOMY    . ELBOW SURGERY    . KNEE ARTHROPLASTY    . KNEE SURGERY    . TOTAL KNEE ARTHROPLASTY Left 03/22/2017  . TOTAL KNEE ARTHROPLASTY Left 03/22/2017   Procedure: LEFT TOTAL KNEE ARTHROPLASTY;  Surgeon: Garald Balding, MD;  Location: [place];  Service: Orthopedics;  Laterality: Left;  . TRIGGER FINGER RELEASE      There were no vitals filed for this visit.  Subjective Assessment - 05/18/17 1109    Subjective  MD was pleased with progress.  Will return to MD in 6 weeks.  I am up to 20 minutes on the bike at home.      Pertinent History  Lt TKA: 03/22/17;  MD mid February    Patient Stated Goals  improve gait, reduce pain, improve Lt knee A/ROM, endurance    Currently in Pain?  Yes    Pain Score  3     Pain Location  Knee    Pain Orientation  Left                      [facility] Adult PT Treatment/Exercise - 05/18/17 0001      Knee/Hip Exercises: Aerobic   Nustep  L3, seat 5 x10 min       Knee/Hip Exercises: Machines for Strengthening   Cybex Knee  Flexion  20# bil 2x10    Total Gym Leg Press  seat 7 bil 90# 2x15; left only 40# 15x      Knee/Hip Exercises: Standing   Forward Step Up  Left;10 reps;Hand Hold: 2;Step Height: 6"    Rocker Board  3 minutes    Walking with Sports Cord  25# forward and reverse , bil. sides x 10 each      Modalities   Modalities  Vasopneumatic      Vasopneumatic   Number Minutes Vasopneumatic   15 minutes    Vasopnuematic Location   Knee    Vasopneumatic Pressure  Medium    Vasopneumatic Temperature   15 minutes      Manual Therapy   Manual Therapy  Soft tissue mobilization;Joint mobilization    Manual therapy comments  soft tissue elongation to distal quad and hamstring and proximal gastroc on the Lt.  mobilization of tissue around incision               PT Short Term Goals - 05/06/17 1144      PT  SHORT TERM GOAL #1   Title  be independent in initial HEP    Status  Achieved      PT SHORT TERM GOAL #2   Title  ambulate with full weight bearing on the Lt and use cane for household distances    Status  Achieved      PT SHORT TERM GOAL #3   Title  demonstrate Lt knee A/ROM flexion to > or = to 95 degrees to allow for sitting without substitution    Status  Achieved      PT SHORT TERM GOAL #4   Title  demonstrate 4/5 Lt knee strength to improve endurance and improve sit to stand    Status  Achieved        PT Long Term Goals - 05/16/17 0856      PT LONG TERM GOAL #1   Title  be independent in advanced HEP    Time  8    Period  Weeks    Status  On-going      PT LONG TERM GOAL #2   Title  improve Lt LE strength to > or = to 5/5 to allow for ascending steps with step-over step gait and perform sit to stand without UE support    Baseline  4+/5    Time  8    Period  Weeks    Status  Revised      PT LONG TERM GOAL #3   Title  reduce FOTO to < or = to 39% limitation    Baseline  45% limitation    Time  8    Period  Weeks    Status  On-going      PT LONG TERM GOAL #4    Title  demonstrate Lt knee A/ROM flexion to > or = to 115 degrees to allow for squatting     Baseline  left knee flexion PROM 100    Period  Weeks    Status  On-going      PT LONG TERM GOAL #5   Title  wean from cane > or = to 50% of the time in the community and demonstrate symmetry on level surfaces    Status  Achieved      PT LONG TERM GOAL #6   Title  report < or = to 3/10 Lt knee pain with standing and walking to improve independence with community and home function    Baseline  2-4/10    Time  8    Period  Weeks    Status  On-going            Plan - 05/18/17 1110    Clinical Impression Statement  Pt continues to make steady progress s/p Lt TKA.  Lt knee P/ROM is 3-100 degrees and edema is improved this week.  Pt with mild antalgia with gait due to limited A/ROM.  Pt will continue to benefit from skilled PT for Lt knee and hip strength, flexibility and progression, gait training, proprioception and edema management.      Rehab Potential  Good    Clinical Impairments Affecting Rehab Potential  left knee TKR 03/22/2017    PT Frequency  3x / week    PT Duration  6 weeks    PT Treatment/Interventions  ADLs/Self Care Home Management;Cryotherapy;Electrical Stimulation;Moist Heat;Gait training;Stair training;Functional mobility training;Therapeutic activities;Therapeutic exercise;Patient/family education;Neuromuscular re-education;Scar mobilization;Passive range of motion;Vasopneumatic Device;Taping    PT Next Visit Plan  manual to Lt knee, edema management, flexibility and  strength; vaso at end of treatment.      Recommended Other Services  initial certification and recert signed    Consulted and Agree with Plan of Care  Patient       Patient will benefit from skilled therapeutic intervention in order to improve the following deficits and impairments:  Abnormal gait, Pain, Decreased mobility, Decreased scar mobility, Decreased strength, Decreased endurance, Decreased activity  tolerance, Decreased range of motion, Difficulty walking, Increased edema, Impaired flexibility  Visit Diagnosis: Localized edema  Stiffness of left knee, not elsewhere classified  Acute pain of left knee  Muscle weakness (generalized)  Other abnormalities of gait and mobility     Problem List Patient Active Problem List   Diagnosis Date Noted  . Primary osteoarthritis of left knee 03/22/2017  . S/P total knee replacement using cement, left 03/22/2017    Sigurd Sos, PT 05/18/17 11:52 AM  [facility] [address] [place], [place], [address] Phone: [phone]   Fax:  [phone]  Name: Chad Flores MRN: [id number] Date of Birth: 09-24-1950

## 2017-05-20 ENCOUNTER — Ambulatory Visit: Payer: Medicare Other | Admitting: Physical Therapy

## 2017-05-20 ENCOUNTER — Encounter: Payer: Self-pay | Admitting: Physical Therapy

## 2017-05-20 DIAGNOSIS — M6281 Muscle weakness (generalized): Secondary | ICD-10-CM | POA: Diagnosis not present

## 2017-05-20 DIAGNOSIS — M25562 Pain in left knee: Secondary | ICD-10-CM

## 2017-05-20 DIAGNOSIS — R2689 Other abnormalities of gait and mobility: Secondary | ICD-10-CM | POA: Diagnosis not present

## 2017-05-20 DIAGNOSIS — R600 Localized edema: Secondary | ICD-10-CM | POA: Diagnosis not present

## 2017-05-20 DIAGNOSIS — M25662 Stiffness of left knee, not elsewhere classified: Secondary | ICD-10-CM | POA: Diagnosis not present

## 2017-05-20 NOTE — Therapy (Signed)
[facility] [address] [place], [place], [address] Phone: [phone]   Fax:  [phone]  Physical Therapy Treatment  Patient Details  Name: Chad Flores MRN: [id number] Date of Birth: [birth year] Referring Provider: [name], MD   Encounter Date: 05/20/2017  PT End of Session - 05/20/17 1145    Visit Number  15    Number of Visits  20    Date for PT Re-Evaluation  06/27/17    Authorization Type  Medicare/Tricare    PT Start Time  1045 did bike on own for 10 min    PT Stop Time  1155    PT Time Calculation (min)  70 min    Activity Tolerance  Patient tolerated treatment well    Behavior During Therapy  [facility] for tasks assessed/performed       Past Medical History:  Diagnosis Date  . Anemia   . Anxiety   . COPD (chronic obstructive pulmonary disease) ([place])   . Osteoarthritis     Past Surgical History:  Procedure Laterality Date  . CHOLECYSTECTOMY    . ELBOW SURGERY    . KNEE ARTHROPLASTY    . KNEE SURGERY    . TOTAL KNEE ARTHROPLASTY Left 03/22/2017  . TOTAL KNEE ARTHROPLASTY Left 03/22/2017   Procedure: LEFT TOTAL KNEE ARTHROPLASTY;  Surgeon: Garald Balding, MD;  Location: [place];  Service: Orthopedics;  Laterality: Left;  . TRIGGER FINGER RELEASE      There were no vitals filed for this visit.  Subjective Assessment - 05/20/17 1100    Subjective  Everything is doing well    Pertinent History  Lt TKA: 03/22/17;  MD mid February    Limitations  Standing;Walking    How long can you stand comfortably?  Rt>Lt weightbearing    How long can you walk comfortably?  15 minutes with fatigue.  Using walker and 50% WB on the Lt    Patient Stated Goals  improve gait, reduce pain, improve Lt knee A/ROM, endurance    Currently in Pain?  Yes    Pain Score  4     Pain Location  Knee    Pain Orientation  Left    Pain Descriptors / Indicators  Sore    Pain Type  Surgical pain    Pain Onset  More  than a month ago    Pain Frequency  Intermittent    Aggravating Factors   weather changes; being on it    Pain Relieving Factors  ice, rest, massage    Multiple Pain Sites  No         [facility] PT Assessment - 05/20/17 0001      PROM   Left Knee Flexion  110                  [facility] Adult PT Treatment/Exercise - 05/20/17 0001      Knee/Hip Exercises: Aerobic   Nustep  L3, seat 5 x10 min       Knee/Hip Exercises: Machines for Strengthening   Cybex Knee Extension  #15 two legs 2x5  vc to push left thigh into seat    Cybex Knee Flexion  20# bil 2x10; left knee flexion #15 manual distraction and anterior glid of left tibia with flex    Total Gym Leg Press  seat 7 bil 90# 2x15; left only 40# 15x manual assistance of rotating tibia ER      Knee/Hip Exercises: Standing   Heel  Raises  Both;20 reps knee straight then knees bent      Modalities   Modalities  Vasopneumatic      Vasopneumatic   Number Minutes Vasopneumatic   15 minutes    Vasopnuematic Location   Knee    Vasopneumatic Pressure  Medium    Vasopneumatic Temperature   15 minutes      Manual Therapy   Manual Therapy  Joint mobilization;Soft tissue mobilization    Manual therapy comments  used assistive device to elongate the gastroc and soleus while on stretch in standing, then on left quads and iliotibial band and around incision as doing PROM to left knee    Joint Mobilization  using the mulligan belt with distraction and anterior glide of the tibia in sitting for left knee flexion    Passive ROM  left knee flexion               PT Short Term Goals - 05/06/17 1144      PT SHORT TERM GOAL #1   Title  be independent in initial HEP    Status  Achieved      PT SHORT TERM GOAL #2   Title  ambulate with full weight bearing on the Lt and use cane for household distances    Status  Achieved      PT SHORT TERM GOAL #3   Title  demonstrate Lt knee A/ROM flexion to > or = to 95 degrees to allow for sitting  without substitution    Status  Achieved      PT SHORT TERM GOAL #4   Title  demonstrate 4/5 Lt knee strength to improve endurance and improve sit to stand    Status  Achieved        PT Long Term Goals - 05/16/17 0856      PT LONG TERM GOAL #1   Title  be independent in advanced HEP    Time  8    Period  Weeks    Status  On-going      PT LONG TERM GOAL #2   Title  improve Lt LE strength to > or = to 5/5 to allow for ascending steps with step-over step gait and perform sit to stand without UE support    Baseline  4+/5    Time  8    Period  Weeks    Status  Revised      PT LONG TERM GOAL #3   Title  reduce FOTO to < or = to 39% limitation    Baseline  45% limitation    Time  8    Period  Weeks    Status  On-going      PT LONG TERM GOAL #4   Title  demonstrate Lt knee A/ROM flexion to > or = to 115 degrees to allow for squatting     Baseline  left knee flexion PROM 100    Period  Weeks    Status  On-going      PT LONG TERM GOAL #5   Title  wean from cane > or = to 50% of the time in the community and demonstrate symmetry on level surfaces    Status  Achieved      PT LONG TERM GOAL #6   Title  report < or = to 3/10 Lt knee pain with standing and walking to improve independence with community and home function    Baseline  2-4/10    Time  8    Period  Weeks    Status  On-going            Plan - 05/20/17 1146    Clinical Impression Statement  Patient has increased left knee flexion with PROM to 110 degrees after mobilizaiton and soft tissue work with assistive device. Patient had no pain with knee extension on leg press when therapist externally rotated the tibia during movement .  Patient has more difficulty doing heel raises with knees bend to isolate the soleus.  After therapy patient was able to walk with increased knee extension.     Rehab Potential  Good    Clinical Impairments Affecting Rehab Potential  left knee TKR 03/22/2017    PT Frequency  3x / week     PT Duration  6 weeks    PT Treatment/Interventions  ADLs/Self Care Home Management;Cryotherapy;Electrical Stimulation;Moist Heat;Gait training;Stair training;Functional mobility training;Therapeutic activities;Therapeutic exercise;Patient/family education;Neuromuscular re-education;Scar mobilization;Passive range of motion;Vasopneumatic Device;Taping    PT Next Visit Plan  manual to Lt knee, edema management, flexibility and strength; vaso at end of treatment.      PT Home Exercise Plan  progress as needed    Consulted and Agree with Plan of Care  Patient       Patient will benefit from skilled therapeutic intervention in order to improve the following deficits and impairments:  Abnormal gait, Pain, Decreased mobility, Decreased scar mobility, Decreased strength, Decreased endurance, Decreased activity tolerance, Decreased range of motion, Difficulty walking, Increased edema, Impaired flexibility  Visit Diagnosis: Localized edema  Stiffness of left knee, not elsewhere classified  Acute pain of left knee  Muscle weakness (generalized)  Other abnormalities of gait and mobility     Problem List Patient Active Problem List   Diagnosis Date Noted  . Primary osteoarthritis of left knee 03/22/2017  . S/P total knee replacement using cement, left 03/22/2017    Earlie Counts, PT 05/20/17 11:50 AM   [facility] [address], [place], [place], [address] Phone: [phone]   Fax:  [phone]  Name: Chad Flores MRN: [id number] Date of Birth: 1950/08/18

## 2017-05-23 ENCOUNTER — Encounter: Payer: Medicare Other | Admitting: Physical Therapy

## 2017-05-25 ENCOUNTER — Encounter: Payer: Medicare Other | Admitting: Physical Therapy

## 2017-05-25 DIAGNOSIS — J069 Acute upper respiratory infection, unspecified: Secondary | ICD-10-CM | POA: Diagnosis not present

## 2017-05-25 DIAGNOSIS — J029 Acute pharyngitis, unspecified: Secondary | ICD-10-CM | POA: Diagnosis not present

## 2017-05-27 ENCOUNTER — Encounter: Payer: Medicare Other | Admitting: Physical Therapy

## 2017-05-30 ENCOUNTER — Ambulatory Visit: Payer: Medicare Other

## 2017-05-30 DIAGNOSIS — R600 Localized edema: Secondary | ICD-10-CM | POA: Diagnosis not present

## 2017-05-30 DIAGNOSIS — M25662 Stiffness of left knee, not elsewhere classified: Secondary | ICD-10-CM | POA: Diagnosis not present

## 2017-05-30 DIAGNOSIS — J029 Acute pharyngitis, unspecified: Secondary | ICD-10-CM | POA: Diagnosis not present

## 2017-05-30 DIAGNOSIS — R2689 Other abnormalities of gait and mobility: Secondary | ICD-10-CM | POA: Diagnosis not present

## 2017-05-30 DIAGNOSIS — M25562 Pain in left knee: Secondary | ICD-10-CM

## 2017-05-30 DIAGNOSIS — M6281 Muscle weakness (generalized): Secondary | ICD-10-CM | POA: Diagnosis not present

## 2017-05-30 NOTE — Therapy (Signed)
[facility] [address], [place], [place], [address] Phone: [phone]   Fax:  [phone]  Physical Therapy Treatment  Patient Details  Name: Chad Flores MRN: [id number] Date of Birth: [birth year] Referring Provider: [name], MD   Encounter Date: 05/30/2017  PT End of Session - 05/30/17 1211    Visit Number  16    Date for PT Re-Evaluation  06/27/17    Authorization Type  Medicare/Tricare    PT Start Time  1055    PT Stop Time  1159    PT Time Calculation (min)  64 min    Activity Tolerance  Patient tolerated treatment well    Behavior During Therapy  [facility] for tasks assessed/performed       Past Medical History:  Diagnosis Date  . Anemia   . Anxiety   . COPD (chronic obstructive pulmonary disease) ([place])   . Osteoarthritis     Past Surgical History:  Procedure Laterality Date  . CHOLECYSTECTOMY    . ELBOW SURGERY    . KNEE ARTHROPLASTY    . KNEE SURGERY    . TOTAL KNEE ARTHROPLASTY Left 03/22/2017  . TOTAL KNEE ARTHROPLASTY Left 03/22/2017   Procedure: LEFT TOTAL KNEE ARTHROPLASTY;  Surgeon: Garald Balding, MD;  Location: [place];  Service: Orthopedics;  Laterality: Left;  . TRIGGER FINGER RELEASE      There were no vitals filed for this visit.  Subjective Assessment - 05/30/17 1059    Subjective  Pt was sick last week.  I've been working on bending it.      Pain Score  0-No pain    Pain Location  Knee    Pain Orientation  Left    Pain Descriptors / Indicators  Sore    Pain Onset  More than a month ago    Pain Frequency  Intermittent    Aggravating Factors   bending the knee too far    Pain Relieving Factors  no pain normally                      [facility] Adult PT Treatment/Exercise - 05/30/17 0001      Knee/Hip Exercises: Aerobic   Nustep  L3, seat 5 x10 min       Knee/Hip Exercises: Machines for Strengthening   Cybex Knee Extension  #15 two legs 2x5  vc to  push left thigh into seat    Cybex Knee Flexion  20# bil 2x10; left knee flexion #15 manual distraction and anterior glid of left tibia with flex    Total Gym Leg Press  seat 7 bil 90# 2x15; left only 40# 15x manual assistance of rotating tibia ER      Knee/Hip Exercises: Standing   Heel Raises  Both;20 reps knee straight then knees bent    Rocker Board  3 minutes    Walking with Sports Cord  25# forward and reverse , bil. sides x 10 each      Knee/Hip Exercises: Supine   Heel Slides  AAROM;Left;10 reps      Modalities   Modalities  Vasopneumatic      Vasopneumatic   Number Minutes Vasopneumatic   15 minutes    Vasopnuematic Location   Knee    Vasopneumatic Pressure  Medium    Vasopneumatic Temperature   15 minutes      Manual Therapy   Manual Therapy  Soft tissue mobilization;Joint mobilization    Manual therapy comments  soft tissue elongation to distal quad and hamstring and proximal gastroc on the Lt.  mobilization of tissue around incision               PT Short Term Goals - 05/06/17 1144      PT SHORT TERM GOAL #1   Title  be independent in initial HEP    Status  Achieved      PT SHORT TERM GOAL #2   Title  ambulate with full weight bearing on the Lt and use cane for household distances    Status  Achieved      PT SHORT TERM GOAL #3   Title  demonstrate Lt knee A/ROM flexion to > or = to 95 degrees to allow for sitting without substitution    Status  Achieved      PT SHORT TERM GOAL #4   Title  demonstrate 4/5 Lt knee strength to improve endurance and improve sit to stand    Status  Achieved        PT Long Term Goals - 05/16/17 0856      PT LONG TERM GOAL #1   Title  be independent in advanced HEP    Time  8    Period  Weeks    Status  On-going      PT LONG TERM GOAL #2   Title  improve Lt LE strength to > or = to 5/5 to allow for ascending steps with step-over step gait and perform sit to stand without UE support    Baseline  4+/5    Time  8     Period  Weeks    Status  Revised      PT LONG TERM GOAL #3   Title  reduce FOTO to < or = to 39% limitation    Baseline  45% limitation    Time  8    Period  Weeks    Status  On-going      PT LONG TERM GOAL #4   Title  demonstrate Lt knee A/ROM flexion to > or = to 115 degrees to allow for squatting     Baseline  left knee flexion PROM 100    Period  Weeks    Status  On-going      PT LONG TERM GOAL #5   Title  wean from cane > or = to 50% of the time in the community and demonstrate symmetry on level surfaces    Status  Achieved      PT LONG TERM GOAL #6   Title  report < or = to 3/10 Lt knee pain with standing and walking to improve independence with community and home function    Baseline  2-4/10    Time  8    Period  Weeks    Status  On-going            Plan - 05/30/17 1118    Clinical Impression Statement  Pt had to miss PT last week due to illness.  Pt with improved quad control with leg extension exercises today.  Pt denies any pain today and demonstrates mild assymmetry with ambulation due to lacking end range knee extension.  Pt with minimal edema in Lt knee.  Pt will continue to benefit from skilled PT for LE strength, edema management and flexibility.      Rehab Potential  Good    PT Frequency  3x / week    PT Duration  6 weeks  PT Treatment/Interventions  ADLs/Self Care Home Management;Cryotherapy;Electrical Stimulation;Moist Heat;Gait training;Stair training;Functional mobility training;Therapeutic activities;Therapeutic exercise;Patient/family education;Neuromuscular re-education;Scar mobilization;Passive range of motion;Vasopneumatic Device;Taping    PT Next Visit Plan  manual to Lt knee, edema management, flexibility and strength; vaso at end of treatment.      Consulted and Agree with Plan of Care  Patient       Patient will benefit from skilled therapeutic intervention in order to improve the following deficits and impairments:  Abnormal gait, Pain,  Decreased mobility, Decreased scar mobility, Decreased strength, Decreased endurance, Decreased activity tolerance, Decreased range of motion, Difficulty walking, Increased edema, Impaired flexibility  Visit Diagnosis: Localized edema  Stiffness of left knee, not elsewhere classified  Acute pain of left knee  Muscle weakness (generalized)  Other abnormalities of gait and mobility     Problem List Patient Active Problem List   Diagnosis Date Noted  . Primary osteoarthritis of left knee 03/22/2017  . S/P total knee replacement using cement, left 03/22/2017    Sigurd Sos, PT 05/30/17 12:13 PM  [facility] [address], [place], [place], [address] Phone: [phone]   Fax:  [phone]  Name: TYRIEK HOFMAN MRN: [id number] Date of Birth: Apr 03, 1951

## 2017-06-01 ENCOUNTER — Encounter: Payer: Self-pay | Admitting: Physical Therapy

## 2017-06-01 ENCOUNTER — Ambulatory Visit: Payer: Medicare Other | Admitting: Physical Therapy

## 2017-06-01 DIAGNOSIS — M25562 Pain in left knee: Secondary | ICD-10-CM

## 2017-06-01 DIAGNOSIS — M6281 Muscle weakness (generalized): Secondary | ICD-10-CM | POA: Diagnosis not present

## 2017-06-01 DIAGNOSIS — R600 Localized edema: Secondary | ICD-10-CM | POA: Diagnosis not present

## 2017-06-01 DIAGNOSIS — R2689 Other abnormalities of gait and mobility: Secondary | ICD-10-CM | POA: Diagnosis not present

## 2017-06-01 DIAGNOSIS — M25662 Stiffness of left knee, not elsewhere classified: Secondary | ICD-10-CM | POA: Diagnosis not present

## 2017-06-01 NOTE — Therapy (Signed)
[facility] [address] [place], [place], [address] Phone: [phone]   Fax:  [phone]  Physical Therapy Treatment  Patient Details  Name: Chad Flores MRN: [id number] Date of Birth: 1950/05/16 Referring Provider: [name], MD   Encounter Date: 06/01/2017  PT End of Session - 06/01/17 1136    Visit Number  17    Date for PT Re-Evaluation  06/27/17    Authorization Type  Medicare/Tricare    PT Start Time  1057    PT Stop Time  1151    PT Time Calculation (min)  54 min    Activity Tolerance  Patient tolerated treatment well;No increased pain    Behavior During Therapy  WFL for tasks assessed/performed       Past Medical History:  Diagnosis Date  . Anemia   . Anxiety   . COPD (chronic obstructive pulmonary disease) ([place])   . Osteoarthritis     Past Surgical History:  Procedure Laterality Date  . CHOLECYSTECTOMY    . ELBOW SURGERY    . KNEE ARTHROPLASTY    . KNEE SURGERY    . TOTAL KNEE ARTHROPLASTY Left 03/22/2017  . TOTAL KNEE ARTHROPLASTY Left 03/22/2017   Procedure: LEFT TOTAL KNEE ARTHROPLASTY;  Surgeon: Garald Balding, MD;  Location: [facility];  Service: Orthopedics;  Laterality: Left;  . TRIGGER FINGER RELEASE      There were no vitals filed for this visit.  Subjective Assessment - 06/01/17 1058    Subjective  Pt reports things are going well. No pain today.     Currently in Pain?  No/denies    Pain Onset  More than a month ago                      [facility] Adult PT Treatment/Exercise - 06/01/17 0001      Knee/Hip Exercises: Stretches   Active Hamstring Stretch  --    Quad Stretch  4 reps;30 seconds;Left;Limitations    Sports administrator Limitations  contract/relax/stretch x4 reps      Knee/Hip Exercises: Aerobic   Nustep  L3, seat 5 x10 min       Knee/Hip Exercises: Machines for Strengthening   Total Gym Leg Press  seat 7 BLE 100# x15 reps, increased to 110# x10  reps for second set; LLE only       Knee/Hip Exercises: Standing   Heel Raises  Left;Limitations    Heel Raises Limitations  x25 reps    SLS  3x20 sec trials on the Lt, able to hold 5-7 sec avg    Other Standing Knee Exercises  tandem stance with each LE forward 2x30 sec; walking heel/toe x2 trials down/back x38f one direction      Knee/Hip Exercises: Supine   Heel Slides  2 sets;10 reps;AAROM      Modalities   Modalities  Vasopneumatic      Vasopneumatic   Number Minutes Vasopneumatic   15 minutes    Vasopnuematic Location   Knee    Vasopneumatic Pressure  Medium    Vasopneumatic Temperature   15 minutes      Manual Therapy   Joint Mobilization  Medial tibial glide MWM to improve Lt knee flexion (110 deg) pt providing AAROM; medial tibial rotation mobilization             PT Education - 06/01/17 1142    Education provided  Yes    Education Details  muscle flexibility and its  impact on knee ROM    Person(s) Educated  Patient    Methods  Explanation    Comprehension  Verbalized understanding       PT Short Term Goals - 05/06/17 1144      PT SHORT TERM GOAL #1   Title  be independent in initial HEP    Status  Achieved      PT SHORT TERM GOAL #2   Title  ambulate with full weight bearing on the Lt and use cane for household distances    Status  Achieved      PT SHORT TERM GOAL #3   Title  demonstrate Lt knee A/ROM flexion to > or = to 95 degrees to allow for sitting without substitution    Status  Achieved      PT SHORT TERM GOAL #4   Title  demonstrate 4/5 Lt knee strength to improve endurance and improve sit to stand    Status  Achieved        PT Long Term Goals - 06/01/17 1140      PT LONG TERM GOAL #1   Title  be independent in advanced HEP    Time  8    Period  Weeks    Status  On-going      PT LONG TERM GOAL #2   Title  improve Lt LE strength to > or = to 5/5 to allow for ascending steps with step-over step gait and perform sit to stand  without UE support    Baseline  4+/5    Time  8    Period  Weeks    Status  Revised      PT LONG TERM GOAL #3   Title  reduce FOTO to < or = to 39% limitation    Baseline  45% limitation    Time  8    Period  Weeks    Status  On-going      PT LONG TERM GOAL #4   Title  demonstrate Lt knee A/ROM flexion to > or = to 115 degrees to allow for squatting     Baseline  left knee flexion PROM 110    Period  Weeks    Status  Partially Met      PT LONG TERM GOAL #5   Title  wean from cane > or = to 50% of the time in the community and demonstrate symmetry on level surfaces    Status  Achieved      PT LONG TERM GOAL #6   Title  report < or = to 3/10 Lt knee pain with standing and walking to improve independence with community and home function    Baseline  2-4/10    Time  8    Period  Weeks    Status  On-going            Plan - 06/01/17 1137    Clinical Impression Statement  Pt continues to demonstrate approximately 110 deg of Lt knee flexion ROM post manual treatment. He does continue to lack rotation of the tibia during ROM work, contributing to his discomfort with this task. Otherwise, pt was able to progress LE strengthening therex without any significant difficulty. Pt is only able to maintain single leg balance for up to 5-7 sec at a time on the Lt without LOB and was encouraged to work on this at home for improved proprioception of the joint. Ended session with game ready, pt noting no increase  in soreness following this.     Rehab Potential  Good    PT Frequency  3x / week    PT Duration  6 weeks    PT Treatment/Interventions  ADLs/Self Care Home Management;Cryotherapy;Electrical Stimulation;Moist Heat;Gait training;Stair training;Functional mobility training;Therapeutic activities;Therapeutic exercise;Patient/family education;Neuromuscular re-education;Scar mobilization;Passive range of motion;Vasopneumatic Device;Taping    PT Next Visit Plan  manual to Lt knee (joint  rotation and quadriceps work), progress functional strength and proprioception of the LLE, pt likes gameready end of session    Consulted and Agree with Plan of Care  Patient       Patient will benefit from skilled therapeutic intervention in order to improve the following deficits and impairments:  Abnormal gait, Pain, Decreased mobility, Decreased scar mobility, Decreased strength, Decreased endurance, Decreased activity tolerance, Decreased range of motion, Difficulty walking, Increased edema, Impaired flexibility  Visit Diagnosis: Localized edema  Stiffness of left knee, not elsewhere classified  Acute pain of left knee  Muscle weakness (generalized)  Other abnormalities of gait and mobility     Problem List Patient Active Problem List   Diagnosis Date Noted  . Primary osteoarthritis of left knee 03/22/2017  . S/P total knee replacement using cement, left 03/22/2017    11:43 AM,06/01/17 Sherol Dade PT, DPT [facility] [address] [place], [place], [address] Phone: [phone]   Fax:  [phone]  Name: Chad Flores MRN: [id number] Date of Birth: 1950-08-14

## 2017-06-03 ENCOUNTER — Ambulatory Visit: Payer: Medicare Other | Admitting: Physical Therapy

## 2017-06-03 DIAGNOSIS — R600 Localized edema: Secondary | ICD-10-CM

## 2017-06-03 DIAGNOSIS — R2689 Other abnormalities of gait and mobility: Secondary | ICD-10-CM

## 2017-06-03 DIAGNOSIS — M6281 Muscle weakness (generalized): Secondary | ICD-10-CM

## 2017-06-03 DIAGNOSIS — M25562 Pain in left knee: Secondary | ICD-10-CM

## 2017-06-03 DIAGNOSIS — M25662 Stiffness of left knee, not elsewhere classified: Secondary | ICD-10-CM | POA: Diagnosis not present

## 2017-06-03 NOTE — Therapy (Signed)
[facility] [address], [place], [place], [address] Phone: [phone]   Fax:  [phone]  Physical Therapy Treatment  Patient Details  Name: Chad Flores MRN: [id number] Date of Birth: Sep 02, 1950 Referring Provider: [name], MD   Encounter Date: 06/03/2017  PT End of Session - 06/03/17 1048    Visit Number  18    Number of Visits  20    Date for PT Re-Evaluation  06/27/17    Authorization Type  Medicare/Tricare    PT Start Time  1005    PT Stop Time  1105    PT Time Calculation (min)  60 min    Activity Tolerance  Patient tolerated treatment well       Past Medical History:  Diagnosis Date  . Anemia   . Anxiety   . COPD (chronic obstructive pulmonary disease) ([place])   . Osteoarthritis     Past Surgical History:  Procedure Laterality Date  . CHOLECYSTECTOMY    . ELBOW SURGERY    . KNEE ARTHROPLASTY    . KNEE SURGERY    . TOTAL KNEE ARTHROPLASTY Left 03/22/2017  . TOTAL KNEE ARTHROPLASTY Left 03/22/2017   Procedure: LEFT TOTAL KNEE ARTHROPLASTY;  Surgeon: Garald Balding, MD;  Location: [facility];  Service: Orthopedics;  Laterality: Left;  . TRIGGER FINGER RELEASE      There were no vitals filed for this visit.  Subjective Assessment - 06/03/17 1032    Subjective  Less of the sharp pain now.      Pertinent History  Lt TKA: 03/22/17;  MD mid February    Currently in Pain?  No/denies    Pain Score  0-No pain    Pain Location  Knee    Pain Orientation  Left    Pain Type  Surgical pain                      [facility] Adult PT Treatment/Exercise - 06/03/17 0001      Knee/Hip Exercises: Stretches   Nature conservation officer Limitations  on 2nd step      Knee/Hip Exercises: Aerobic   Nustep  L3, seat 5 x10 min       Knee/Hip Exercises: Machines for Strengthening   Total Gym Leg Press  Seat 7 100# bil 20x, left only 50# 20x      Knee/Hip Exercises:  Standing   Hip Extension  Stengthening;Right;Left;10 reps;Knee straight    Extension Limitations  red band WB on left    Forward Step Up  Left;15 reps;Hand Hold: 2;Step Height: 6"    Step Down  Left;Hand Hold: 2;Step Height: 4"    Other Standing Knee Exercises  tandem stance with each LE forward 2x30 sec; walking heel/toe x2 trials down/back x94f one direction    Other Standing Knee Exercises  high stepping       Vasopneumatic   Number Minutes Vasopneumatic   15 minutes    Vasopnuematic Location   Knee    Vasopneumatic Pressure  Medium    Vasopneumatic Temperature   15 minutes      Manual Therapy   Joint Mobilization  knee flexion mobs with tibial glide in sitting and supine    Soft tissue mobilization  proximal quad                PT Short Term Goals - 06/03/17 1209      PT SHORT TERM  GOAL #1   Title  be independent in initial HEP    Status  Achieved      PT SHORT TERM GOAL #2   Title  ambulate with full weight bearing on the Lt and use cane for household distances    Status  Achieved      PT SHORT TERM GOAL #3   Title  demonstrate Lt knee A/ROM flexion to > or = to 95 degrees to allow for sitting without substitution    Status  Achieved      PT SHORT TERM GOAL #4   Title  demonstrate 4/5 Lt knee strength to improve endurance and improve sit to stand    Status  Achieved        PT Long Term Goals - 06/01/17 1140      PT LONG TERM GOAL #1   Title  be independent in advanced HEP    Time  8    Period  Weeks    Status  On-going      PT LONG TERM GOAL #2   Title  improve Lt LE strength to > or = to 5/5 to allow for ascending steps with step-over step gait and perform sit to stand without UE support    Baseline  4+/5    Time  8    Period  Weeks    Status  Revised      PT LONG TERM GOAL #3   Title  reduce FOTO to < or = to 39% limitation    Baseline  45% limitation    Time  8    Period  Weeks    Status  On-going      PT LONG TERM GOAL #4   Title   demonstrate Lt knee A/ROM flexion to > or = to 115 degrees to allow for squatting     Baseline  left knee flexion PROM 110    Period  Weeks    Status  Partially Met      PT LONG TERM GOAL #5   Title  wean from cane > or = to 50% of the time in the community and demonstrate symmetry on level surfaces    Status  Achieved      PT LONG TERM GOAL #6   Title  report < or = to 3/10 Lt knee pain with standing and walking to improve independence with community and home function    Baseline  2-4/10    Time  8    Period  Weeks    Status  On-going            Plan - 06/03/17 1206    Clinical Impression Statement  Patient reports he is having much less pain.  Improving knee flexion following soft tissue mobilization of proximal quads and joint mobilization.  Muscular fatigue with full weight bearing on left after 8-10 sec.   Good pain relief and edema reduction with vasocompression.      Rehab Potential  Good    Clinical Impairments Affecting Rehab Potential  left knee TKR 03/22/2017    PT Frequency  3x / week    PT Duration  6 weeks    PT Next Visit Plan  manual to Lt knee (joint rotation and quadriceps work), progress functional strength and proprioception of the LLE, pt likes gameready end of session;   needs KX       Patient will benefit from skilled therapeutic intervention in order to improve the following deficits  and impairments:  Abnormal gait, Pain, Decreased mobility, Decreased scar mobility, Decreased strength, Decreased endurance, Decreased activity tolerance, Decreased range of motion, Difficulty walking, Increased edema, Impaired flexibility  Visit Diagnosis: Localized edema  Stiffness of left knee, not elsewhere classified  Acute pain of left knee  Muscle weakness (generalized)  Other abnormalities of gait and mobility     Problem List Patient Active Problem List   Diagnosis Date Noted  . Primary osteoarthritis of left knee 03/22/2017  . S/P total knee  replacement using cement, left 03/22/2017   Ruben Im, PT 06/03/17 12:11 PM Phone: [phone] Fax: [phone]  Alvera Singh 06/03/2017, 12:10 PM  [facility] [address] [place], [place], [address] Phone: [phone]   Fax:  [phone]  Name: DELL BRINER MRN: [id number] Date of Birth: 1950-09-27

## 2017-06-06 ENCOUNTER — Ambulatory Visit: Payer: Medicare Other | Admitting: Physical Therapy

## 2017-06-06 ENCOUNTER — Encounter: Payer: Self-pay | Admitting: Physical Therapy

## 2017-06-06 DIAGNOSIS — M25662 Stiffness of left knee, not elsewhere classified: Secondary | ICD-10-CM

## 2017-06-06 DIAGNOSIS — R2689 Other abnormalities of gait and mobility: Secondary | ICD-10-CM | POA: Diagnosis not present

## 2017-06-06 DIAGNOSIS — M6281 Muscle weakness (generalized): Secondary | ICD-10-CM | POA: Diagnosis not present

## 2017-06-06 DIAGNOSIS — R600 Localized edema: Secondary | ICD-10-CM | POA: Diagnosis not present

## 2017-06-06 DIAGNOSIS — M25562 Pain in left knee: Secondary | ICD-10-CM | POA: Diagnosis not present

## 2017-06-06 NOTE — Therapy (Signed)
[facility] [address], [place], [place], [address] Phone: [phone]   Fax:  [phone]  Physical Therapy Treatment  Patient Details  Name: Chad Flores MRN: [id number] Date of Birth: [birth year] Referring Provider: [name], MD   Encounter Date: 06/06/2017  PT End of Session - 06/06/17 1003    Visit Number  19    Number of Visits  20    Date for PT Re-Evaluation  06/27/17    Authorization Type  Medicare/Tricare    PT Start Time  0920 10 min untimed nustep prior to session     PT Stop Time  1021    PT Time Calculation (min)  61 min    Activity Tolerance  Patient tolerated treatment well    Behavior During Therapy  [facility] for tasks assessed/performed       Past Medical History:  Diagnosis Date  . Anemia   . Anxiety   . COPD (chronic obstructive pulmonary disease) ([place])   . Osteoarthritis     Past Surgical History:  Procedure Laterality Date  . CHOLECYSTECTOMY    . ELBOW SURGERY    . KNEE ARTHROPLASTY    . KNEE SURGERY    . TOTAL KNEE ARTHROPLASTY Left 03/22/2017  . TOTAL KNEE ARTHROPLASTY Left 03/22/2017   Procedure: LEFT TOTAL KNEE ARTHROPLASTY;  Surgeon: Garald Balding, MD;  Location: [facility];  Service: Orthopedics;  Laterality: Left;  . TRIGGER FINGER RELEASE      There were no vitals filed for this visit.  Subjective Assessment - 06/06/17 0931    Subjective  Pt reports that the knee is a little sore at the moment, but not too bad overall. He continues to work on his stretches at home.     Pertinent History  Lt TKA: 03/22/17;  MD mid February    Currently in Pain?  Yes    Pain Score  2     Pain Location  Knee    Pain Orientation  Left;Medial;Lateral    Pain Descriptors / Indicators  Sore;Sharp    Pain Type  Surgical pain    Pain Onset  More than a month ago    Pain Frequency  Intermittent    Aggravating Factors   alot of bending the knee    Pain Relieving Factors  no pain  during the day typically.                       [facility] Adult PT Treatment/Exercise - 06/06/17 0001      Knee/Hip Exercises: Aerobic   Nustep  L5 x10 min      Knee/Hip Exercises: Standing   Forward Step Up  Left;2 sets;15 reps;Hand Hold: 2;Step Height: 6";Limitations    Forward Step Up Limitations  with contralateral knee drive, 2nd set with 1 hand assist     Other Standing Knee Exercises  Lt hamstring curl with 3# ankle weights 2x10     Other Standing Knee Exercises  Lt knee drive on step C37 reps ,5 sec hold       Vasopneumatic   Number Minutes Vasopneumatic   15 minutes    Vasopnuematic Location   Knee    Vasopneumatic Pressure  Medium    Vasopneumatic Temperature   15 minutes      Manual Therapy   Joint Mobilization  Lt knee flexion MWM closed chain on steps x10 reps; Lt femoral IR grade III mobilization; Grade III-IV AP/PA proximal fibular mobs  Soft tissue mobilization  IASTM Lt quadriceps (lateral)              PT Education - 06/06/17 1003    Education provided  Yes    Education Details  technique with therex    Person(s) Educated  Patient    Methods  Explanation;Verbal cues    Comprehension  Verbalized understanding;Returned demonstration       PT Short Term Goals - 06/03/17 1209      PT SHORT TERM GOAL #1   Title  be independent in initial HEP    Status  Achieved      PT SHORT TERM GOAL #2   Title  ambulate with full weight bearing on the Lt and use cane for household distances    Status  Achieved      PT SHORT TERM GOAL #3   Title  demonstrate Lt knee A/ROM flexion to > or = to 95 degrees to allow for sitting without substitution    Status  Achieved      PT SHORT TERM GOAL #4   Title  demonstrate 4/5 Lt knee strength to improve endurance and improve sit to stand    Status  Achieved        PT Long Term Goals - 06/06/17 1010      PT LONG TERM GOAL #1   Title  be independent in advanced HEP    Time  8    Period  Weeks     Status  On-going      PT LONG TERM GOAL #2   Title  improve Lt LE strength to > or = to 5/5 to allow for ascending steps with step-over step gait and perform sit to stand without UE support    Baseline  4+/5    Time  8    Period  Weeks    Status  Revised      PT LONG TERM GOAL #3   Title  reduce FOTO to < or = to 39% limitation    Baseline  45% limitation    Time  8    Period  Weeks    Status  On-going      PT LONG TERM GOAL #4   Title  demonstrate Lt knee A/ROM flexion to > or = to 115 degrees to allow for squatting     Baseline  Lt knee PROM 115 deg    Period  Weeks    Status  Achieved      PT LONG TERM GOAL #5   Title  wean from cane > or = to 50% of the time in the community and demonstrate symmetry on level surfaces    Status  Achieved      PT LONG TERM GOAL #6   Title  report < or = to 3/10 Lt knee pain with standing and walking to improve independence with community and home function    Baseline  2-4/10    Time  8    Period  Weeks    Status  On-going            Plan - 06/06/17 1007    Clinical Impression Statement  Pt continues to work on his stretches and exercises at home. Noted 2/10 sharp pain with end ranged of knee flexion beginning of session. Completed manual techniques to improve rotation of the tibia/femur in closed chain positions as well as decreased quadriceps spasm. In addition pt was able to completed therex progressions this session  without significant difficulty or report of pain, noting resolved pain with knee flexion ROM improvement to 115 deg end of session. Will continue with cornet POC.     Rehab Potential  Good    Clinical Impairments Affecting Rehab Potential  left knee TKR 03/22/2017    PT Frequency  3x / week    PT Duration  6 weeks    PT Next Visit Plan  manual to Lt knee (joint rotation and quadriceps work) as needed, progress functional strength and proprioception of the LLE, pt likes gameready end of session;   needs KX    Consulted  and Agree with Plan of Care  Patient       Patient will benefit from skilled therapeutic intervention in order to improve the following deficits and impairments:  Abnormal gait, Pain, Decreased mobility, Decreased scar mobility, Decreased strength, Decreased endurance, Decreased activity tolerance, Decreased range of motion, Difficulty walking, Increased edema, Impaired flexibility  Visit Diagnosis: Localized edema  Stiffness of left knee, not elsewhere classified  Acute pain of left knee  Muscle weakness (generalized)  Other abnormalities of gait and mobility     Problem List Patient Active Problem List   Diagnosis Date Noted  . Primary osteoarthritis of left knee 03/22/2017  . S/P total knee replacement using cement, left 03/22/2017   11:02 AM,06/06/17 Sherol Dade PT, DPT [facility] [address] [place], [place], [address] Phone: [phone]   Fax:  [phone]  Name: Chad Flores MRN: [id number] Date of Birth: February 20, 1951

## 2017-06-08 ENCOUNTER — Ambulatory Visit: Payer: Medicare Other | Admitting: Physical Therapy

## 2017-06-08 ENCOUNTER — Encounter: Payer: Self-pay | Admitting: Physical Therapy

## 2017-06-08 DIAGNOSIS — M25662 Stiffness of left knee, not elsewhere classified: Secondary | ICD-10-CM | POA: Diagnosis not present

## 2017-06-08 DIAGNOSIS — M6281 Muscle weakness (generalized): Secondary | ICD-10-CM

## 2017-06-08 DIAGNOSIS — R2689 Other abnormalities of gait and mobility: Secondary | ICD-10-CM

## 2017-06-08 DIAGNOSIS — M25562 Pain in left knee: Secondary | ICD-10-CM

## 2017-06-08 DIAGNOSIS — R600 Localized edema: Secondary | ICD-10-CM | POA: Diagnosis not present

## 2017-06-08 NOTE — Therapy (Signed)
[facility] [address] [place], [place], [address] Phone: [phone]   Fax:  [phone]  Physical Therapy Treatment  Patient Details  Name: Chad Flores MRN: [id number] Date of Birth: 29-Mar-1951 Referring Provider: [name], MD   Encounter Date: 06/08/2017  PT End of Session - 06/08/17 1150    Visit Number  20    Number of Visits  20    Date for PT Re-Evaluation  06/27/17    Authorization Type  Medicare/Tricare    PT Start Time  1138    PT Stop Time  1232    PT Time Calculation (min)  54 min    Activity Tolerance  Patient tolerated treatment well;No increased pain    Behavior During Therapy  WFL for tasks assessed/performed       Past Medical History:  Diagnosis Date  . Anemia   . Anxiety   . COPD (chronic obstructive pulmonary disease) ([place])   . Osteoarthritis     Past Surgical History:  Procedure Laterality Date  . CHOLECYSTECTOMY    . ELBOW SURGERY    . KNEE ARTHROPLASTY    . KNEE SURGERY    . TOTAL KNEE ARTHROPLASTY Left 03/22/2017  . TOTAL KNEE ARTHROPLASTY Left 03/22/2017   Procedure: LEFT TOTAL KNEE ARTHROPLASTY;  Surgeon: Garald Balding, MD;  Location: [place];  Service: Orthopedics;  Laterality: Left;  . TRIGGER FINGER RELEASE      There were no vitals filed for this visit.  Subjective Assessment - 06/08/17 1140    Subjective  Pt reports that his knee was doing good after last session. He was able to complete his exercises and stretches with the adjustments we discussed, and this helped alot with the pain during the stretch.     Pertinent History  Lt TKA: 03/22/17;  MD mid February    Currently in Pain?  No/denies    Pain Onset  More than a month ago                      [facility] Adult PT Treatment/Exercise - 06/08/17 0001      Knee/Hip Exercises: Stretches   Passive Hamstring Stretch  Left;3 reps;20 seconds;Limitations    Passive Hamstring Stretch  Limitations  standing with LE on step        Knee/Hip Exercises: Aerobic   Nustep  L4 x5 min Pt present to discuss alterations made to HEP      Knee/Hip Exercises: Machines for Strengthening   Total Gym Leg Press  seat 7: 110# BLE x20 reps (last 10 reps with 3 sec eccentric lower), LLE only 60# x15 reps      Knee/Hip Exercises: Standing   Heel Raises  Both;20 reps;1 set    Heel Raises Limitations  single leg     Hip Extension  Both;10 reps;Knee straight;Stengthening;1 set    Extension Limitations  red TB, UE support on counter     Forward Step Up  2 sets;Left;Hand Hold: 1;Step Height: 4";10 reps;Limitations    Forward Step Up Limitations  RUE support on ski pole       Knee/Hip Exercises: Supine   Other Supine Knee/Hip Exercises  straight leg bridge with LE on red physioball x10 reps (therapist supporting ball); BLE bridge with hamstring curl (LE on bolster) x10 reps       Vasopneumatic   Number Minutes Vasopneumatic   15 minutes    Vasopnuematic Location   Knee  Vasopneumatic Pressure  Medium    Vasopneumatic Temperature   15 minutes      Manual Therapy   Soft tissue mobilization  IASTM Lt quadriceps              PT Education - 06/08/17 1220    Education provided  Yes    Education Details  technique with therex     Person(s) Educated  Patient    Methods  Verbal cues;Explanation    Comprehension  Returned demonstration       PT Short Term Goals - 06/03/17 1209      PT SHORT TERM GOAL #1   Title  be independent in initial HEP    Status  Achieved      PT SHORT TERM GOAL #2   Title  ambulate with full weight bearing on the Lt and use cane for household distances    Status  Achieved      PT SHORT TERM GOAL #3   Title  demonstrate Lt knee A/ROM flexion to > or = to 95 degrees to allow for sitting without substitution    Status  Achieved      PT SHORT TERM GOAL #4   Title  demonstrate 4/5 Lt knee strength to improve endurance and improve sit to stand    Status   Achieved        PT Long Term Goals - 06/06/17 1010      PT LONG TERM GOAL #1   Title  be independent in advanced HEP    Time  8    Period  Weeks    Status  On-going      PT LONG TERM GOAL #2   Title  improve Lt LE strength to > or = to 5/5 to allow for ascending steps with step-over step gait and perform sit to stand without UE support    Baseline  4+/5    Time  8    Period  Weeks    Status  Revised      PT LONG TERM GOAL #3   Title  reduce FOTO to < or = to 39% limitation    Baseline  45% limitation    Time  8    Period  Weeks    Status  On-going      PT LONG TERM GOAL #4   Title  demonstrate Lt knee A/ROM flexion to > or = to 115 degrees to allow for squatting     Baseline  Lt knee PROM 115 deg    Period  Weeks    Status  Achieved      PT LONG TERM GOAL #5   Title  wean from cane > or = to 50% of the time in the community and demonstrate symmetry on level surfaces    Status  Achieved      PT LONG TERM GOAL #6   Title  report < or = to 3/10 Lt knee pain with standing and walking to improve independence with community and home function    Baseline  2-4/10    Time  8    Period  Weeks    Status  On-going            Plan - 06/08/17 1225    Clinical Impression Statement  Pt reported decreased pain with knee stretches at home following his last session. Continued this session with LE strength and proprioception exercises, pt demonstrating fatigue with hamstring work primarily. Overall, no reports of  increase in knee pain, despite increases in exercise resistance/reps. Pt continues to have soft tissue restrictions in the Lt quadriceps, however this appears to be improving. Will continue with current POC.     Rehab Potential  Good    Clinical Impairments Affecting Rehab Potential  left knee TKR 03/22/2017    PT Frequency  3x / week    PT Duration  6 weeks    PT Next Visit Plan  manual to Lt knee (joint rotation and quadriceps work) as needed, progress functional  strength and proprioception of the LLE, pt likes gameready end of session    Consulted and Agree with Plan of Care  Patient       Patient will benefit from skilled therapeutic intervention in order to improve the following deficits and impairments:  Abnormal gait, Pain, Decreased mobility, Decreased scar mobility, Decreased strength, Decreased endurance, Decreased activity tolerance, Decreased range of motion, Difficulty walking, Increased edema, Impaired flexibility  Visit Diagnosis: Localized edema  Stiffness of left knee, not elsewhere classified  Acute pain of left knee  Muscle weakness (generalized)  Other abnormalities of gait and mobility     Problem List Patient Active Problem List   Diagnosis Date Noted  . Primary osteoarthritis of left knee 03/22/2017  . S/P total knee replacement using cement, left 03/22/2017    12:32 PM,06/08/17 Sherol Dade PT, DPT [facility] [address] [place], [place], [address] Phone: [phone]   Fax:  [phone]  Name: QUINNLAN ABRUZZO MRN: [id number] Date of Birth: 1951-04-26

## 2017-06-10 ENCOUNTER — Ambulatory Visit: Payer: Medicare Other | Attending: Family Medicine | Admitting: Physical Therapy

## 2017-06-10 ENCOUNTER — Encounter: Payer: Self-pay | Admitting: Physical Therapy

## 2017-06-10 DIAGNOSIS — M6281 Muscle weakness (generalized): Secondary | ICD-10-CM | POA: Diagnosis not present

## 2017-06-10 DIAGNOSIS — R2689 Other abnormalities of gait and mobility: Secondary | ICD-10-CM | POA: Diagnosis not present

## 2017-06-10 DIAGNOSIS — M25662 Stiffness of left knee, not elsewhere classified: Secondary | ICD-10-CM | POA: Diagnosis not present

## 2017-06-10 DIAGNOSIS — M25562 Pain in left knee: Secondary | ICD-10-CM | POA: Insufficient documentation

## 2017-06-10 DIAGNOSIS — R600 Localized edema: Secondary | ICD-10-CM | POA: Diagnosis not present

## 2017-06-10 NOTE — Therapy (Signed)
[facility] [address], [place], [place], [address] Phone: [phone]   Fax:  [phone]  Physical Therapy Treatment  Patient Details  Name: Chad Flores MRN: [id number] Date of Birth: Dec 29, 1950 Referring Provider: [name], MD   Encounter Date: 06/10/2017  PT End of Session - 06/10/17 1059    Visit Number  21    Date for PT Re-Evaluation  06/27/17    Authorization Type  Medicare/Tricare    PT Start Time  1055    PT Stop Time  1150    PT Time Calculation (min)  55 min    Activity Tolerance  Patient tolerated treatment well;No increased pain    Behavior During Therapy  WFL for tasks assessed/performed       Past Medical History:  Diagnosis Date  . Anemia   . Anxiety   . COPD (chronic obstructive pulmonary disease) ([place])   . Osteoarthritis     Past Surgical History:  Procedure Laterality Date  . CHOLECYSTECTOMY    . ELBOW SURGERY    . KNEE ARTHROPLASTY    . KNEE SURGERY    . TOTAL KNEE ARTHROPLASTY Left 03/22/2017  . TOTAL KNEE ARTHROPLASTY Left 03/22/2017   Procedure: LEFT TOTAL KNEE ARTHROPLASTY;  Surgeon: Garald Balding, MD;  Location: [facility];  Service: Orthopedics;  Laterality: Left;  . TRIGGER FINGER RELEASE      There were no vitals filed for this visit.  Subjective Assessment - 06/10/17 1059    Subjective  I am feeling good. Trouble sleeping.     Pertinent History  Lt TKA: 03/22/17;  MD mid February    Limitations  Standing;Walking    How long can you stand comfortably?  Rt>Lt weightbearing    How long can you walk comfortably?  15 minutes with fatigue.  Using walker and 50% WB on the Lt    Patient Stated Goals  improve gait, reduce pain, improve Lt knee A/ROM, endurance    Currently in Pain?  No/denies         [facility] PT Assessment - 06/10/17 0001      PROM   Left Knee Flexion  113 sitting using mulligan strap                  [facility] Adult PT  Treatment/Exercise - 06/10/17 0001      Knee/Hip Exercises: Aerobic   Nustep  L4 x10 min Pt present to discuss alterations made to HEP      Knee/Hip Exercises: Machines for Strengthening   Total Gym Leg Press  seat 7: 110# BLE x20 reps (last 10 reps with 3 sec eccentric lower), LLE only 60# x15 reps      Knee/Hip Exercises: Standing   Heel Raises  Both;20 reps;1 set    Heel Raises Limitations  single leg     Hip Extension  Stengthening;Left;2 sets;10 reps;Knee straight    Extension Limitations  blue band; UE support    Forward Step Up  Left;1 set;10 reps;Hand Hold: 0    Forward Step Up Limitations  left; 6 inch; no hands 15x    Step Down  Left;1 set;15 reps;Hand Hold: 0;Step Height: 4"    Other Standing Knee Exercises  stand on left leg and slide rigth leg out to side,       Knee/Hip Exercises: Supine   Other Supine Knee/Hip Exercises  straight leg bridge with LE on red physioball x10 reps (therapist supporting ball); BLE bridge with hamstring curl (  LE on bolster) x10 reps       Modalities   Modalities  Vasopneumatic      Vasopneumatic   Number Minutes Vasopneumatic   15 minutes    Vasopnuematic Location   Knee    Vasopneumatic Pressure  Medium    Vasopneumatic Temperature   15 minutes      Manual Therapy   Manual Therapy  Passive ROM    Passive ROM  left knee flexion on high low mat with mulligan strap mobilizing tibia anterior and therapist giving distraction               PT Short Term Goals - 06/03/17 1209      PT SHORT TERM GOAL #1   Title  be independent in initial HEP    Status  Achieved      PT SHORT TERM GOAL #2   Title  ambulate with full weight bearing on the Lt and use cane for household distances    Status  Achieved      PT SHORT TERM GOAL #3   Title  demonstrate Lt knee A/ROM flexion to > or = to 95 degrees to allow for sitting without substitution    Status  Achieved      PT SHORT TERM GOAL #4   Title  demonstrate 4/5 Lt knee strength to  improve endurance and improve sit to stand    Status  Achieved        PT Long Term Goals - 06/06/17 1010      PT LONG TERM GOAL #1   Title  be independent in advanced HEP    Time  8    Period  Weeks    Status  On-going      PT LONG TERM GOAL #2   Title  improve Lt LE strength to > or = to 5/5 to allow for ascending steps with step-over step gait and perform sit to stand without UE support    Baseline  4+/5    Time  8    Period  Weeks    Status  Revised      PT LONG TERM GOAL #3   Title  reduce FOTO to < or = to 39% limitation    Baseline  45% limitation    Time  8    Period  Weeks    Status  On-going      PT LONG TERM GOAL #4   Title  demonstrate Lt knee A/ROM flexion to > or = to 115 degrees to allow for squatting     Baseline  Lt knee PROM 115 deg    Period  Weeks    Status  Achieved      PT LONG TERM GOAL #5   Title  wean from cane > or = to 50% of the time in the community and demonstrate symmetry on level surfaces    Status  Achieved      PT LONG TERM GOAL #6   Title  report < or = to 3/10 Lt knee pain with standing and walking to improve independence with community and home function    Baseline  2-4/10    Time  8    Period  Weeks    Status  On-going            Plan - 06/10/17 1139    Clinical Impression Statement  Patient has increased left knee PROM to 113 degrees in sitting. Pateint is ambulating with increased left  knee extension. Patient is able to do step ups and step downs without holding onto something now.  Patient will benefit from skilled therapy to improve left knee ROM and soft tissue mobility.     Rehab Potential  Good    Clinical Impairments Affecting Rehab Potential  left knee TKR 03/22/2017    PT Frequency  3x / week    PT Duration  6 weeks    PT Treatment/Interventions  ADLs/Self Care Home Management;Cryotherapy;Electrical Stimulation;Moist Heat;Gait training;Stair training;Functional mobility training;Therapeutic activities;Therapeutic  exercise;Patient/family education;Neuromuscular re-education;Scar mobilization;Passive range of motion;Vasopneumatic Device;Taping    PT Next Visit Plan  manual to Lt knee (joint rotation and quadriceps work) as needed, progress functional strength and proprioception of the LLE, pt likes gameready end of session    PT Home Exercise Plan  progress as needed    Consulted and Agree with Plan of Care  Patient       Patient will benefit from skilled therapeutic intervention in order to improve the following deficits and impairments:  Abnormal gait, Pain, Decreased mobility, Decreased scar mobility, Decreased strength, Decreased endurance, Decreased activity tolerance, Decreased range of motion, Difficulty walking, Increased edema, Impaired flexibility  Visit Diagnosis: Localized edema  Stiffness of left knee, not elsewhere classified  Acute pain of left knee  Muscle weakness (generalized)  Other abnormalities of gait and mobility     Problem List Patient Active Problem List   Diagnosis Date Noted  . Primary osteoarthritis of left knee 03/22/2017  . S/P total knee replacement using cement, left 03/22/2017    Earlie Counts, PT 06/10/17 11:42 AM    [facility] [address], [place], [place], [address] Phone: [phone]   Fax:  [phone]  Name: Chad Flores MRN: [id number] Date of Birth: 04-17-1951

## 2017-06-13 ENCOUNTER — Ambulatory Visit: Payer: Medicare Other | Admitting: Physical Therapy

## 2017-06-13 ENCOUNTER — Encounter: Payer: Self-pay | Admitting: Physical Therapy

## 2017-06-13 DIAGNOSIS — R2689 Other abnormalities of gait and mobility: Secondary | ICD-10-CM

## 2017-06-13 DIAGNOSIS — M25662 Stiffness of left knee, not elsewhere classified: Secondary | ICD-10-CM | POA: Diagnosis not present

## 2017-06-13 DIAGNOSIS — M25562 Pain in left knee: Secondary | ICD-10-CM | POA: Diagnosis not present

## 2017-06-13 DIAGNOSIS — R600 Localized edema: Secondary | ICD-10-CM | POA: Diagnosis not present

## 2017-06-13 DIAGNOSIS — M6281 Muscle weakness (generalized): Secondary | ICD-10-CM

## 2017-06-13 NOTE — Therapy (Signed)
[facility] [address], [place], [place], [address] Phone: [phone]   Fax:  [phone]  Physical Therapy Treatment  Patient Details  Name: Chad Flores MRN: [id number] Date of Birth: [birth year] Referring Provider: [name], MD   Encounter Date: 06/13/2017  PT End of Session - 06/13/17 1109    Visit Number  22    Date for PT Re-Evaluation  06/27/17    Authorization Type  Medicare/Tricare    PT Start Time  1100    PT Stop Time  1150    PT Time Calculation (min)  50 min    Activity Tolerance  Patient tolerated treatment well;No increased pain    Behavior During Therapy  WFL for tasks assessed/performed       Past Medical History:  Diagnosis Date  . Anemia   . Anxiety   . COPD (chronic obstructive pulmonary disease) ([place])   . Osteoarthritis     Past Surgical History:  Procedure Laterality Date  . CHOLECYSTECTOMY    . ELBOW SURGERY    . KNEE ARTHROPLASTY    . KNEE SURGERY    . TOTAL KNEE ARTHROPLASTY Left 03/22/2017  . TOTAL KNEE ARTHROPLASTY Left 03/22/2017   Procedure: LEFT TOTAL KNEE ARTHROPLASTY;  Surgeon: Garald Balding, MD;  Location: [facility];  Service: Orthopedics;  Laterality: Left;  . TRIGGER FINGER RELEASE      There were no vitals filed for this visit.  Subjective Assessment - 06/13/17 1107    Subjective  I had some sharp pains last night.  No pain today.     Pertinent History  Lt TKA: 03/22/17;  MD mid February    Limitations  Standing;Walking    How long can you stand comfortably?  Rt>Lt weightbearing    How long can you walk comfortably?  15 minutes with fatigue.  Using walker and 50% WB on the Lt    Patient Stated Goals  improve gait, reduce pain, improve Lt knee A/ROM, endurance    Currently in Pain?  No/denies         [facility] PT Assessment - 06/13/17 0001      PROM   Left Knee Flexion  111 sitting using mulligan strap                  [facility] Adult PT  Treatment/Exercise - 06/13/17 0001      Knee/Hip Exercises: Aerobic   Nustep  L4 x10 min prior to therapy      Knee/Hip Exercises: Machines for Strengthening   Total Gym Leg Press  seat 7: 110# BLE x20 reps (last 10 reps with 3 sec eccentric lower), LLE only 65# x15 reps      Knee/Hip Exercises: Standing   Heel Raises  Both;20 reps;1 set    Heel Raises Limitations  single leg     Hip Extension  Stengthening;Left;2 sets;15 reps;Knee straight    Extension Limitations  blue band; UE support    Forward Step Up  Left;15 reps;Hand Hold: 1;Step Height: 6";2 sets    Step Down  Left;1 set;15 reps;Hand Hold: 0;Step Height: 4"    Other Standing Knee Exercises  stand on left leg and slide rigth leg out to side,       Modalities   Modalities  Vasopneumatic      Vasopneumatic   Number Minutes Vasopneumatic   15 minutes    Vasopnuematic Location   Knee    Vasopneumatic Pressure  Medium    Vasopneumatic  Temperature   15 minutes      Manual Therapy   Manual Therapy  Passive ROM    Passive ROM  left knee flexion on high low mat with mulligan strap mobilizing tibia anterior and therapist giving distraction               PT Short Term Goals - 06/03/17 1209      PT SHORT TERM GOAL #1   Title  be independent in initial HEP    Status  Achieved      PT SHORT TERM GOAL #2   Title  ambulate with full weight bearing on the Lt and use cane for household distances    Status  Achieved      PT SHORT TERM GOAL #3   Title  demonstrate Lt knee A/ROM flexion to > or = to 95 degrees to allow for sitting without substitution    Status  Achieved      PT SHORT TERM GOAL #4   Title  demonstrate 4/5 Lt knee strength to improve endurance and improve sit to stand    Status  Achieved        PT Long Term Goals - 06/13/17 1110      PT LONG TERM GOAL #1   Title  be independent in advanced HEP    Baseline  still learning    Time  8    Period  Weeks    Status  On-going      PT LONG TERM GOAL #2    Title  improve Lt LE strength to > or = to 5/5 to allow for ascending steps with step-over step gait and perform sit to stand without UE support    Baseline  4+/5    Time  8    Period  Weeks    Status  On-going      PT LONG TERM GOAL #3   Title  reduce FOTO to < or = to 39% limitation    Baseline  45% limitation    Time  8    Period  Weeks    Status  On-going      PT LONG TERM GOAL #4   Title  demonstrate Lt knee A/ROM flexion to > or = to 115 degrees to allow for squatting     Baseline  Lt knee PROM 113 deg    Time  8    Period  Weeks    Status  Achieved      PT LONG TERM GOAL #5   Title  wean from cane > or = to 50% of the time in the community and demonstrate symmetry on level surfaces    Time  8    Period  Weeks    Status  Achieved      PT LONG TERM GOAL #6   Title  report < or = to 3/10 Lt knee pain with standing and walking to improve independence with community and home function    Time  8    Period  Weeks    Status  Achieved            Plan - 06/13/17 1110    Clinical Impression Statement  Patient is progressing the number of repiitions of his exericse program.  Patient is getting more knee flexion that will last.  Patient will benefi tfrom skilled therapy to improve left knee ROM and soft tissue mobility.     Rehab Potential  Good    Clinical  Impairments Affecting Rehab Potential  left knee TKR 03/22/2017    PT Frequency  3x / week    PT Duration  6 weeks    PT Treatment/Interventions  ADLs/Self Care Home Management;Cryotherapy;Electrical Stimulation;Moist Heat;Gait training;Stair training;Functional mobility training;Therapeutic activities;Therapeutic exercise;Patient/family education;Neuromuscular re-education;Scar mobilization;Passive range of motion;Vasopneumatic Device;Taping    PT Next Visit Plan  manual to Lt knee (joint rotation and quadriceps work) as needed, progress functional strength and proprioception of the LLE, pt likes gameready end of  session    PT Home Exercise Plan  progress as needed    Consulted and Agree with Plan of Care  Patient       Patient will benefit from skilled therapeutic intervention in order to improve the following deficits and impairments:  Abnormal gait, Pain, Decreased mobility, Decreased scar mobility, Decreased strength, Decreased endurance, Decreased activity tolerance, Decreased range of motion, Difficulty walking, Increased edema, Impaired flexibility  Visit Diagnosis: Localized edema  Stiffness of left knee, not elsewhere classified  Acute pain of left knee  Muscle weakness (generalized)  Other abnormalities of gait and mobility     Problem List Patient Active Problem List   Diagnosis Date Noted  . Primary osteoarthritis of left knee 03/22/2017  . S/P total knee replacement using cement, left 03/22/2017    Earlie Counts, PT 06/13/17 11:41 AM ' [facility] [address], [place], [place], [address] Phone: [phone]   Fax:  [phone]  Name: Chad Flores MRN: [id number] Date of Birth: Aug 30, 1950

## 2017-06-15 ENCOUNTER — Encounter: Payer: Self-pay | Admitting: Physical Therapy

## 2017-06-15 ENCOUNTER — Ambulatory Visit: Payer: Medicare Other | Admitting: Physical Therapy

## 2017-06-15 DIAGNOSIS — M6281 Muscle weakness (generalized): Secondary | ICD-10-CM

## 2017-06-15 DIAGNOSIS — R2689 Other abnormalities of gait and mobility: Secondary | ICD-10-CM

## 2017-06-15 DIAGNOSIS — M25562 Pain in left knee: Secondary | ICD-10-CM

## 2017-06-15 DIAGNOSIS — R600 Localized edema: Secondary | ICD-10-CM

## 2017-06-15 DIAGNOSIS — M25662 Stiffness of left knee, not elsewhere classified: Secondary | ICD-10-CM

## 2017-06-15 NOTE — Therapy (Signed)
[facility] [address], [place], [place], [address] Phone: [phone]   Fax:  [phone]  Physical Therapy Treatment  Patient Details  Name: Chad Flores MRN: [id number] Date of Birth: [birth year] Referring Provider: [name], MD   Encounter Date: 06/15/2017  PT End of Session - 06/15/17 1147    Visit Number  23    Date for PT Re-Evaluation  06/27/17    Authorization Type  Medicare/Tricare    PT Start Time  1100    PT Stop Time  1159    PT Time Calculation (min)  59 min    Activity Tolerance  Patient tolerated treatment well;No increased pain    Behavior During Therapy  WFL for tasks assessed/performed       Past Medical History:  Diagnosis Date  . Anemia   . Anxiety   . COPD (chronic obstructive pulmonary disease) ([place])   . Osteoarthritis     Past Surgical History:  Procedure Laterality Date  . CHOLECYSTECTOMY    . ELBOW SURGERY    . KNEE ARTHROPLASTY    . KNEE SURGERY    . TOTAL KNEE ARTHROPLASTY Left 03/22/2017  . TOTAL KNEE ARTHROPLASTY Left 03/22/2017   Procedure: LEFT TOTAL KNEE ARTHROPLASTY;  Surgeon: Garald Balding, MD;  Location: [place];  Service: Orthopedics;  Laterality: Left;  . TRIGGER FINGER RELEASE      There were no vitals filed for this visit.  Subjective Assessment - 06/15/17 1155    Subjective  Pt reports things are going well. No complaints at this time.     Pertinent History  Lt TKA: 03/22/17;  MD mid February    Limitations  Standing;Walking    How long can you stand comfortably?  Rt>Lt weightbearing    How long can you walk comfortably?  15 minutes with fatigue.  Using walker and 50% WB on the Lt    Patient Stated Goals  improve gait, reduce pain, improve Lt knee A/ROM, endurance    Currently in Pain?  No/denies                      [facility] Adult PT Treatment/Exercise - 06/15/17 0001      Knee/Hip Exercises: Stretches   Gastroc Stretch   Both;3 reps;30 seconds;Limitations    Gastroc Stretch Limitations  slantboard       Knee/Hip Exercises: Aerobic   Nustep  L4 x10 min prior to start of therapy      Knee/Hip Exercises: Machines for Strengthening   Total Gym Leg Press  seat 7: 120# 3 sets, x20 reps (last 10 reps with 3 sec eccentric lower)       Knee/Hip Exercises: Standing   Heel Raises  Both;1 set;20 reps    Heel Raises Limitations  off step for increased ankle ROM, weight shift Lt     Forward Step Up  Left;2 sets;15 reps;Hand Hold: 1;Step Height: 4";Other (comment) with foam pad    Forward Step Up Limitations  RLE 1x15 reps; 1 UE support and contralateral knee drive    Other Standing Knee Exercises  tandem hold with ball toss on rebounder 2x10 tosses each LE forward (yellow ball)     Other Standing Knee Exercises  RLE on slider without UE support and 3 way reach x10 reps; closed chain adductor slide with UE support x10 reps each       Modalities   Modalities  Vasopneumatic      Vasopneumatic  Number Minutes Vasopneumatic   15 minutes    Vasopnuematic Location   Knee    Vasopneumatic Pressure  Medium    Vasopneumatic Temperature   15 minutes      Manual Therapy   Soft tissue mobilization  IASTM Lt quadriceps, TPR Lt quadriceps              PT Education - 06/15/17 1147    Education provided  No       PT Short Term Goals - 06/03/17 1209      PT SHORT TERM GOAL #1   Title  be independent in initial HEP    Status  Achieved      PT SHORT TERM GOAL #2   Title  ambulate with full weight bearing on the Lt and use cane for household distances    Status  Achieved      PT SHORT TERM GOAL #3   Title  demonstrate Lt knee A/ROM flexion to > or = to 95 degrees to allow for sitting without substitution    Status  Achieved      PT SHORT TERM GOAL #4   Title  demonstrate 4/5 Lt knee strength to improve endurance and improve sit to stand    Status  Achieved        PT Long Term Goals - 06/13/17 1110       PT LONG TERM GOAL #1   Title  be independent in advanced HEP    Baseline  still learning    Time  8    Period  Weeks    Status  On-going      PT LONG TERM GOAL #2   Title  improve Lt LE strength to > or = to 5/5 to allow for ascending steps with step-over step gait and perform sit to stand without UE support    Baseline  4+/5    Time  8    Period  Weeks    Status  On-going      PT LONG TERM GOAL #3   Title  reduce FOTO to < or = to 39% limitation    Baseline  45% limitation    Time  8    Period  Weeks    Status  On-going      PT LONG TERM GOAL #4   Title  demonstrate Lt knee A/ROM flexion to > or = to 115 degrees to allow for squatting     Baseline  Lt knee PROM 113 deg    Time  8    Period  Weeks    Status  Achieved      PT LONG TERM GOAL #5   Title  wean from cane > or = to 50% of the time in the community and demonstrate symmetry on level surfaces    Time  8    Period  Weeks    Status  Achieved      PT LONG TERM GOAL #6   Title  report < or = to 3/10 Lt knee pain with standing and walking to improve independence with community and home function    Time  8    Period  Weeks    Status  Achieved            Plan - 06/15/17 1150    Clinical Impression Statement  Pt continues to do well in therapy, completing higher level balance exercises and progressing resistance with leg press without any significant difficulty. Noting improvements  in palpable trigger points and muscle tension along the Lt quadriceps and ended the session with techniques to further address this. Will continue with current POC progressing ROM, strength and proprioception.     Rehab Potential  Good    Clinical Impairments Affecting Rehab Potential  left knee TKR 03/22/2017    PT Frequency  3x / week    PT Duration  6 weeks    PT Treatment/Interventions  ADLs/Self Care Home Management;Cryotherapy;Electrical Stimulation;Moist Heat;Gait training;Stair training;Functional mobility training;Therapeutic  activities;Therapeutic exercise;Patient/family education;Neuromuscular re-education;Scar mobilization;Passive range of motion;Vasopneumatic Device;Taping    PT Next Visit Plan  get pt % improvement rating/possible d/c or decrease in frequency leading up to end of cert; manual to Lt knee (joint rotation and quadriceps work) as needed, progress functional strength and proprioception of the LLE, pt likes gameready end of session    PT Home Exercise Plan  progress as needed    Consulted and Agree with Plan of Care  Patient       Patient will benefit from skilled therapeutic intervention in order to improve the following deficits and impairments:  Abnormal gait, Pain, Decreased mobility, Decreased scar mobility, Decreased strength, Decreased endurance, Decreased activity tolerance, Decreased range of motion, Difficulty walking, Increased edema, Impaired flexibility  Visit Diagnosis: Localized edema  Stiffness of left knee, not elsewhere classified  Acute pain of left knee  Muscle weakness (generalized)  Other abnormalities of gait and mobility     Problem List Patient Active Problem List   Diagnosis Date Noted  . Primary osteoarthritis of left knee 03/22/2017  . S/P total knee replacement using cement, left 03/22/2017    11:56 AM,06/15/17 Sherol Dade PT, DPT [facility] [address] [place], [place], [address] Phone: [phone]   Fax:  [phone]  Name: REMON QUINTO MRN: [id number] Date of Birth: Apr 18, 1951

## 2017-06-17 ENCOUNTER — Ambulatory Visit: Payer: Medicare Other | Admitting: Physical Therapy

## 2017-06-17 DIAGNOSIS — R600 Localized edema: Secondary | ICD-10-CM | POA: Diagnosis not present

## 2017-06-17 DIAGNOSIS — M25662 Stiffness of left knee, not elsewhere classified: Secondary | ICD-10-CM | POA: Diagnosis not present

## 2017-06-17 DIAGNOSIS — M6281 Muscle weakness (generalized): Secondary | ICD-10-CM | POA: Diagnosis not present

## 2017-06-17 DIAGNOSIS — R2689 Other abnormalities of gait and mobility: Secondary | ICD-10-CM | POA: Diagnosis not present

## 2017-06-17 DIAGNOSIS — M25562 Pain in left knee: Secondary | ICD-10-CM

## 2017-06-17 NOTE — Therapy (Signed)
[facility] [address], [place], [place], [address] Phone: [phone]   Fax:  [phone]  Physical Therapy Treatment  Patient Details  Name: Chad Flores MRN: [id number] Date of Birth: 1951/03/06 Referring Provider: [name], MD   Encounter Date: 06/17/2017  PT End of Session - 06/17/17 1014    Visit Number  24    Date for PT Re-Evaluation  06/27/17    Authorization Type  Medicare/Tricare    PT Start Time  0920 part of time unsupervised on Nu-Step    PT Stop Time  1030    PT Time Calculation (min)  70 min    Activity Tolerance  Patient tolerated treatment well       Past Medical History:  Diagnosis Date  . Anemia   . Anxiety   . COPD (chronic obstructive pulmonary disease) ([place])   . Osteoarthritis     Past Surgical History:  Procedure Laterality Date  . CHOLECYSTECTOMY    . ELBOW SURGERY    . KNEE ARTHROPLASTY    . KNEE SURGERY    . TOTAL KNEE ARTHROPLASTY Left 03/22/2017  . TOTAL KNEE ARTHROPLASTY Left 03/22/2017   Procedure: LEFT TOTAL KNEE ARTHROPLASTY;  Surgeon: Garald Balding, MD;  Location: [place];  Service: Orthopedics;  Laterality: Left;  . TRIGGER FINGER RELEASE      There were no vitals filed for this visit.  Subjective Assessment - 06/17/17 1207    Subjective  No complaints.  The pain is no longer sharp.  "This knee hasn't bent this well since the 1970's."   "I can't quite make it go all the way straight."  Reports increased swelling after walking.      Pertinent History  Lt TKA: 03/22/17;  MD mid February         [facility] PT Assessment - 06/17/17 0001      AROM   Left Knee Extension  3    Left Knee Flexion  115      PROM   Left Knee Extension  6    Left Knee Flexion  114      Strength   Left Knee Flexion  4/5    Left Knee Extension  4/5                  [facility] Adult PT Treatment/Exercise - 06/17/17 0001      Knee/Hip Exercises: Aerobic   Stationary Bike  5 min full revolutions     Nustep  L4 x10 min prior to start of therapy      Knee/Hip Exercises: Machines for Strengthening   Total Gym Leg Press  seat 7: 120# 3 sets, x20 reps (last 10 reps with 3 sec eccentric lower) 60# single leg      Knee/Hip Exercises: Standing   Lateral Step Up  Left;10 reps;Hand Hold: 0;Step Height: 6" right LE hip abduction     Forward Step Up  Left;15 reps;Hand Hold: 0;Step Height: 6"    Forward Step Up Limitations  right LE to chair touch    Step Down  Left;15 reps;Hand Hold: 0;Step Height: 2"    SLS with Vectors  4 ways 10x      Vasopneumatic   Number Minutes Vasopneumatic   15 minutes    Vasopnuematic Location   Knee    Vasopneumatic Pressure  Medium    Vasopneumatic Temperature   15 minutes      Manual Therapy   Joint Mobilization  knee flexion  and extension mobs grade 3 20 sec seated and supine    Soft tissue mobilization  quads distal and proximal    Other Manual Therapy  patellar mobs inferior grade 3 10x               PT Short Term Goals - 06/03/17 1209      PT SHORT TERM GOAL #1   Title  be independent in initial HEP    Status  Achieved      PT SHORT TERM GOAL #2   Title  ambulate with full weight bearing on the Lt and use cane for household distances    Status  Achieved      PT SHORT TERM GOAL #3   Title  demonstrate Lt knee A/ROM flexion to > or = to 95 degrees to allow for sitting without substitution    Status  Achieved      PT SHORT TERM GOAL #4   Title  demonstrate 4/5 Lt knee strength to improve endurance and improve sit to stand    Status  Achieved        PT Long Term Goals - 06/13/17 1110      PT LONG TERM GOAL #1   Title  be independent in advanced HEP    Baseline  still learning    Time  8    Period  Weeks    Status  On-going      PT LONG TERM GOAL #2   Title  improve Lt LE strength to > or = to 5/5 to allow for ascending steps with step-over step gait and perform sit to stand without UE  support    Baseline  4+/5    Time  8    Period  Weeks    Status  On-going      PT LONG TERM GOAL #3   Title  reduce FOTO to < or = to 39% limitation    Baseline  45% limitation    Time  8    Period  Weeks    Status  On-going      PT LONG TERM GOAL #4   Title  demonstrate Lt knee A/ROM flexion to > or = to 115 degrees to allow for squatting     Baseline  Lt knee PROM 113 deg    Time  8    Period  Weeks    Status  Achieved      PT LONG TERM GOAL #5   Title  wean from cane > or = to 50% of the time in the community and demonstrate symmetry on level surfaces    Time  8    Period  Weeks    Status  Achieved      PT LONG TERM GOAL #6   Title  report < or = to 3/10 Lt knee pain with standing and walking to improve independence with community and home function    Time  8    Period  Weeks    Status  Achieved            Plan - 06/17/17 1015    Clinical Impression Statement  The patient is progressing well with knee ROM in both flexion and extension.  Quad strength improving as well but does fatigue quickly especially with ascending/descending steps.  Anticipate he will meet remaining rehab goals by the end of next week.      Rehab Potential  Good    Clinical Impairments Affecting Rehab  Potential  left knee TKR 03/22/2017    PT Frequency  3x / week    PT Duration  6 weeks    PT Treatment/Interventions  ADLs/Self Care Home Management;Cryotherapy;Electrical Stimulation;Moist Heat;Gait training;Stair training;Functional mobility training;Therapeutic activities;Therapeutic exercise;Patient/family education;Neuromuscular re-education;Scar mobilization;Passive range of motion;Vasopneumatic Device;Taping    PT Next Visit Plan  manual to Lt knee (joint rotation and quadriceps work) as needed, progress functional strength and proprioception of the LLE, vascompression;  anticipate discharge in 1 week;   KX modifiers       Patient will benefit from skilled therapeutic intervention in order  to improve the following deficits and impairments:  Abnormal gait, Pain, Decreased mobility, Decreased scar mobility, Decreased strength, Decreased endurance, Decreased activity tolerance, Decreased range of motion, Difficulty walking, Increased edema, Impaired flexibility  Visit Diagnosis: Localized edema  Stiffness of left knee, not elsewhere classified  Acute pain of left knee  Muscle weakness (generalized)  Other abnormalities of gait and mobility     Problem List Patient Active Problem List   Diagnosis Date Noted  . Primary osteoarthritis of left knee 03/22/2017  . S/P total knee replacement using cement, left 03/22/2017   Ruben Im, PT 06/17/17 12:19 PM Phone: [phone] Fax: [phone]  Alvera Singh 06/17/2017, 12:19 PM  [facility] [address] [place], [place], [address] Phone: [phone]   Fax:  [phone]  Name: Chad Flores MRN: [id number] Date of Birth: September 05, 1950

## 2017-06-20 ENCOUNTER — Ambulatory Visit: Payer: Medicare Other

## 2017-06-20 DIAGNOSIS — M25562 Pain in left knee: Secondary | ICD-10-CM

## 2017-06-20 DIAGNOSIS — R600 Localized edema: Secondary | ICD-10-CM

## 2017-06-20 DIAGNOSIS — M6281 Muscle weakness (generalized): Secondary | ICD-10-CM | POA: Diagnosis not present

## 2017-06-20 DIAGNOSIS — M25662 Stiffness of left knee, not elsewhere classified: Secondary | ICD-10-CM

## 2017-06-20 DIAGNOSIS — R2689 Other abnormalities of gait and mobility: Secondary | ICD-10-CM

## 2017-06-20 NOTE — Therapy (Signed)
[facility] [address] [place], [place], [address] Phone: [phone]   Fax:  [phone]  Physical Therapy Treatment  Patient Details  Name: Chad Flores MRN: [id number] Date of Birth: April 20, 1951 Referring Provider: [name], MD   Encounter Date: 06/20/2017  PT End of Session - 06/20/17 1223    Visit Number  25    Date for PT Re-Evaluation  06/27/17    Authorization Type  Medicare/Tricare    PT Start Time  1140    PT Stop Time  1240    PT Time Calculation (min)  60 min    Activity Tolerance  Patient tolerated treatment well    Behavior During Therapy  [facility] for tasks assessed/performed       Past Medical History:  Diagnosis Date  . Anemia   . Anxiety   . COPD (chronic obstructive pulmonary disease) ([place])   . Osteoarthritis     Past Surgical History:  Procedure Laterality Date  . CHOLECYSTECTOMY    . ELBOW SURGERY    . KNEE ARTHROPLASTY    . KNEE SURGERY    . TOTAL KNEE ARTHROPLASTY Left 03/22/2017  . TOTAL KNEE ARTHROPLASTY Left 03/22/2017   Procedure: LEFT TOTAL KNEE ARTHROPLASTY;  Surgeon: Garald Balding, MD;  Location: [place];  Service: Orthopedics;  Laterality: Left;  . TRIGGER FINGER RELEASE      There were no vitals filed for this visit.  Subjective Assessment - 06/20/17 1152    Subjective  No complaints. I see the MD next week.      Currently in Pain?  No/denies                      [facility] Adult PT Treatment/Exercise - 06/20/17 0001      Knee/Hip Exercises: Aerobic   Stationary Bike  Level 2x 10 minutes    Nustep  Level 4 x 10 minutes       Knee/Hip Exercises: Machines for Strengthening   Total Gym Leg Press  seat 7: 120# 3 sets, x20 reps (last 10 reps with 3 sec eccentric lower) 60# single leg      Knee/Hip Exercises: Standing   Lateral Step Up  Left;10 reps;Hand Hold: 0;Step Height: 6" right LE hip abduction     Forward Step Up  Left;15 reps;Hand Hold:  0;Step Height: 6" on Bosu    Step Down  Left;15 reps;Hand Hold: 0;Step Height: 2"    SLS with Vectors  4 ways 10x      Vasopneumatic   Number Minutes Vasopneumatic   15 minutes    Vasopnuematic Location   Knee    Vasopneumatic Pressure  Medium    Vasopneumatic Temperature   15 minutes               PT Short Term Goals - 06/03/17 1209      PT SHORT TERM GOAL #1   Title  be independent in initial HEP    Status  Achieved      PT SHORT TERM GOAL #2   Title  ambulate with full weight bearing on the Lt and use cane for household distances    Status  Achieved      PT SHORT TERM GOAL #3   Title  demonstrate Lt knee A/ROM flexion to > or = to 95 degrees to allow for sitting without substitution    Status  Achieved      PT SHORT TERM GOAL #4  Title  demonstrate 4/5 Lt knee strength to improve endurance and improve sit to stand    Status  Achieved        PT Long Term Goals - 06/13/17 1110      PT LONG TERM GOAL #1   Title  be independent in advanced HEP    Baseline  still learning    Time  8    Period  Weeks    Status  On-going      PT LONG TERM GOAL #2   Title  improve Lt LE strength to > or = to 5/5 to allow for ascending steps with step-over step gait and perform sit to stand without UE support    Baseline  4+/5    Time  8    Period  Weeks    Status  On-going      PT LONG TERM GOAL #3   Title  reduce FOTO to < or = to 39% limitation    Baseline  45% limitation    Time  8    Period  Weeks    Status  On-going      PT LONG TERM GOAL #4   Title  demonstrate Lt knee A/ROM flexion to > or = to 115 degrees to allow for squatting     Baseline  Lt knee PROM 113 deg    Time  8    Period  Weeks    Status  Achieved      PT LONG TERM GOAL #5   Title  wean from cane > or = to 50% of the time in the community and demonstrate symmetry on level surfaces    Time  8    Period  Weeks    Status  Achieved      PT LONG TERM GOAL #6   Title  report < or = to 3/10 Lt  knee pain with standing and walking to improve independence with community and home function    Time  8    Period  Weeks    Status  Achieved            Plan - 06/20/17 1224    Clinical Impression Statement  Pt is making steady progress regarding endurance, strength and stability of the Lt knee.  Pt was able to perform step-ups on Bosu ball with good stability.  Pt demostrated fatigue and instability with single leg stance on Lt with vectors.  Pt required verbal cues for techinque with this.  Pt will continue to benefit from skilled PT for 2-3 more sessions to advance stability, strength and edema management of the Lt knee s/p TKR.      Rehab Potential  Good    PT Frequency  3x / week    PT Duration  6 weeks    PT Treatment/Interventions  ADLs/Self Care Home Management;Cryotherapy;Electrical Stimulation;Moist Heat;Gait training;Stair training;Functional mobility training;Therapeutic activities;Therapeutic exercise;Patient/family education;Neuromuscular re-education;Scar mobilization;Passive range of motion;Vasopneumatic Device;Taping    PT Next Visit Plan  manual to Lt knee (joint rotation and quadriceps work) as needed, progress functional strength and proprioception of the LLE, vascompression;  anticipate discharge in 1 week;   KX modifiers    Consulted and Agree with Plan of Care  Patient       Patient will benefit from skilled therapeutic intervention in order to improve the following deficits and impairments:  Abnormal gait, Pain, Decreased mobility, Decreased scar mobility, Decreased strength, Decreased endurance, Decreased activity tolerance, Decreased range of motion, Difficulty walking,  Increased edema, Impaired flexibility  Visit Diagnosis: Localized edema  Stiffness of left knee, not elsewhere classified  Acute pain of left knee  Muscle weakness (generalized)  Other abnormalities of gait and mobility     Problem List Patient Active Problem List   Diagnosis Date  Noted  . Primary osteoarthritis of left knee 03/22/2017  . S/P total knee replacement using cement, left 03/22/2017     Sigurd Sos, PT 06/20/17 12:28 PM  [facility] [address], [place], [place], [address] Phone: [phone]   Fax:  [phone]  Name: LENNIN OSMOND MRN: [id number] Date of Birth: 09-29-1950

## 2017-06-22 ENCOUNTER — Encounter: Payer: Self-pay | Admitting: Physical Therapy

## 2017-06-22 ENCOUNTER — Ambulatory Visit: Payer: Medicare Other | Admitting: Physical Therapy

## 2017-06-22 ENCOUNTER — Other Ambulatory Visit: Payer: Self-pay | Admitting: Family Medicine

## 2017-06-22 DIAGNOSIS — M25562 Pain in left knee: Secondary | ICD-10-CM | POA: Diagnosis not present

## 2017-06-22 DIAGNOSIS — I719 Aortic aneurysm of unspecified site, without rupture: Secondary | ICD-10-CM

## 2017-06-22 DIAGNOSIS — R2689 Other abnormalities of gait and mobility: Secondary | ICD-10-CM

## 2017-06-22 DIAGNOSIS — J439 Emphysema, unspecified: Secondary | ICD-10-CM | POA: Diagnosis not present

## 2017-06-22 DIAGNOSIS — R600 Localized edema: Secondary | ICD-10-CM | POA: Diagnosis not present

## 2017-06-22 DIAGNOSIS — I700 Atherosclerosis of aorta: Secondary | ICD-10-CM | POA: Diagnosis not present

## 2017-06-22 DIAGNOSIS — Z0000 Encounter for general adult medical examination without abnormal findings: Secondary | ICD-10-CM | POA: Diagnosis not present

## 2017-06-22 DIAGNOSIS — R7303 Prediabetes: Secondary | ICD-10-CM | POA: Diagnosis not present

## 2017-06-22 DIAGNOSIS — Z1159 Encounter for screening for other viral diseases: Secondary | ICD-10-CM | POA: Diagnosis not present

## 2017-06-22 DIAGNOSIS — Z5181 Encounter for therapeutic drug level monitoring: Secondary | ICD-10-CM | POA: Diagnosis not present

## 2017-06-22 DIAGNOSIS — M25662 Stiffness of left knee, not elsewhere classified: Secondary | ICD-10-CM | POA: Diagnosis not present

## 2017-06-22 DIAGNOSIS — M6281 Muscle weakness (generalized): Secondary | ICD-10-CM

## 2017-06-22 DIAGNOSIS — Z125 Encounter for screening for malignant neoplasm of prostate: Secondary | ICD-10-CM | POA: Diagnosis not present

## 2017-06-22 DIAGNOSIS — F321 Major depressive disorder, single episode, moderate: Secondary | ICD-10-CM | POA: Diagnosis not present

## 2017-06-22 DIAGNOSIS — E7800 Pure hypercholesterolemia, unspecified: Secondary | ICD-10-CM | POA: Diagnosis not present

## 2017-06-22 NOTE — Therapy (Signed)
[facility] [address], [place], [place], [address] Phone: [phone]   Fax:  [phone]  Physical Therapy Treatment  Patient Details  Name: Chad Flores MRN: [id number] Date of Birth: Jul 19, 1950 Referring Provider: [name], MD   Encounter Date: 06/22/2017  PT End of Session - 06/22/17 1120    Visit Number  26    Date for PT Re-Evaluation  06/27/17    Authorization Type  Medicare/Tricare    PT Start Time  1100    PT Stop Time  1153    PT Time Calculation (min)  53 min    Activity Tolerance  Patient tolerated treatment well;No increased pain    Behavior During Therapy  WFL for tasks assessed/performed       Past Medical History:  Diagnosis Date  . Anemia   . Anxiety   . COPD (chronic obstructive pulmonary disease) ([place])   . Osteoarthritis     Past Surgical History:  Procedure Laterality Date  . CHOLECYSTECTOMY    . ELBOW SURGERY    . KNEE ARTHROPLASTY    . KNEE SURGERY    . TOTAL KNEE ARTHROPLASTY Left 03/22/2017  . TOTAL KNEE ARTHROPLASTY Left 03/22/2017   Procedure: LEFT TOTAL KNEE ARTHROPLASTY;  Surgeon: Garald Balding, MD;  Location: [place];  Service: Orthopedics;  Laterality: Left;  . TRIGGER FINGER RELEASE      There were no vitals filed for this visit.  Subjective Assessment - 06/22/17 1103    Subjective  Pt reports that his knee is doing well. He still has occasional sharp pain in the knee, however this is much less frequent that it has been. No other complaints.     Currently in Pain?  No/denies                      [facility] Adult PT Treatment/Exercise - 06/22/17 0001      Knee/Hip Exercises: Machines for Strengthening   Total Gym Leg Press  Seat 8: 120# 2x15 (foam pad), LLE only 65# 2x15 reps       Knee/Hip Exercises: Standing   Other Standing Knee Exercises  closed chain adductor slide 2x10 reps each LE, therapist cuing to improve weight shift       Knee/Hip Exercises: Supine   Other Supine Knee/Hip Exercises  bent knee fallout x15 reps each side      Knee/Hip Exercises: Sidelying   Hip ABduction  2 sets;Left;Strengthening;10 reps;Limitations    Hip ABduction Limitations  x15 reps first set; 3# ankle weight, therapist cuing to decrease TFL activation/compensation      Modalities   Modalities  Vasopneumatic      Vasopneumatic   Number Minutes Vasopneumatic   15 minutes    Vasopnuematic Location   Knee    Vasopneumatic Pressure  Medium    Vasopneumatic Temperature   15 minutes      Manual Therapy   Joint Mobilization  Lt knee medial rotation mobilization x3 bouts     Soft tissue mobilization  Lt distal quadriceps              PT Education - 06/22/17 1125    Education Details  technique with therex     Person(s) Educated  Patient    Methods  Explanation;Verbal cues;Tactile cues    Comprehension  Verbalized understanding;Returned demonstration       PT Short Term Goals - 06/03/17 1209      PT SHORT TERM GOAL #1  Title  be independent in initial HEP    Status  Achieved      PT SHORT TERM GOAL #2   Title  ambulate with full weight bearing on the Lt and use cane for household distances    Status  Achieved      PT SHORT TERM GOAL #3   Title  demonstrate Lt knee A/ROM flexion to > or = to 95 degrees to allow for sitting without substitution    Status  Achieved      PT SHORT TERM GOAL #4   Title  demonstrate 4/5 Lt knee strength to improve endurance and improve sit to stand    Status  Achieved        PT Long Term Goals - 06/13/17 1110      PT LONG TERM GOAL #1   Title  be independent in advanced HEP    Baseline  still learning    Time  8    Period  Weeks    Status  On-going      PT LONG TERM GOAL #2   Title  improve Lt LE strength to > or = to 5/5 to allow for ascending steps with step-over step gait and perform sit to stand without UE support    Baseline  4+/5    Time  8    Period  Weeks    Status   On-going      PT LONG TERM GOAL #3   Title  reduce FOTO to < or = to 39% limitation    Baseline  45% limitation    Time  8    Period  Weeks    Status  On-going      PT LONG TERM GOAL #4   Title  demonstrate Lt knee A/ROM flexion to > or = to 115 degrees to allow for squatting     Baseline  Lt knee PROM 113 deg    Time  8    Period  Weeks    Status  Achieved      PT LONG TERM GOAL #5   Title  wean from cane > or = to 50% of the time in the community and demonstrate symmetry on level surfaces    Time  8    Period  Weeks    Status  Achieved      PT LONG TERM GOAL #6   Title  report < or = to 3/10 Lt knee pain with standing and walking to improve independence with community and home function    Time  8    Period  Weeks    Status  Achieved            Plan - 06/22/17 1121    Clinical Impression Statement  Pt continues to do well in his sessions, completing several progressions in therex this session without difficulty. He did require therapist cuing during sidelying hip abduction due to glute weakness and tendency to compensate with TFL and other hip flexor muscle groups. Pt reports overall decrease in frequency of Lt knee pain during the day. Pt will likely be ready for d/c at next visit due to overall progress towards goals.     Rehab Potential  Good    PT Frequency  3x / week    PT Duration  6 weeks    PT Treatment/Interventions  ADLs/Self Care Home Management;Cryotherapy;Electrical Stimulation;Moist Heat;Gait training;Stair training;Functional mobility training;Therapeutic activities;Therapeutic exercise;Patient/family education;Neuromuscular re-education;Scar mobilization;Passive range of motion;Vasopneumatic Device;Taping  PT Next Visit Plan  re-eval and possible d/c; KX needed    Consulted and Agree with Plan of Care  Patient       Patient will benefit from skilled therapeutic intervention in order to improve the following deficits and impairments:  Abnormal gait,  Pain, Decreased mobility, Decreased scar mobility, Decreased strength, Decreased endurance, Decreased activity tolerance, Decreased range of motion, Difficulty walking, Increased edema, Impaired flexibility  Visit Diagnosis: Localized edema  Stiffness of left knee, not elsewhere classified  Acute pain of left knee  Muscle weakness (generalized)  Other abnormalities of gait and mobility     Problem List Patient Active Problem List   Diagnosis Date Noted  . Primary osteoarthritis of left knee 03/22/2017  . S/P total knee replacement using cement, left 03/22/2017   11:45 AM,06/22/17 Sherol Dade PT, DPT [place] [facility] [address] [place], [place], [address] Phone: [phone]   Fax:  [phone]  Name: Chad Flores MRN: [id number] Date of Birth: Mar 27, 1951

## 2017-06-24 ENCOUNTER — Ambulatory Visit: Payer: Medicare Other | Admitting: Physical Therapy

## 2017-06-24 ENCOUNTER — Encounter: Payer: Self-pay | Admitting: Physical Therapy

## 2017-06-24 ENCOUNTER — Telehealth: Payer: Self-pay | Admitting: Physical Therapy

## 2017-06-24 DIAGNOSIS — M25662 Stiffness of left knee, not elsewhere classified: Secondary | ICD-10-CM | POA: Diagnosis not present

## 2017-06-24 DIAGNOSIS — M25562 Pain in left knee: Secondary | ICD-10-CM | POA: Diagnosis not present

## 2017-06-24 DIAGNOSIS — M6281 Muscle weakness (generalized): Secondary | ICD-10-CM

## 2017-06-24 DIAGNOSIS — R600 Localized edema: Secondary | ICD-10-CM

## 2017-06-24 DIAGNOSIS — R2689 Other abnormalities of gait and mobility: Secondary | ICD-10-CM | POA: Diagnosis not present

## 2017-06-24 NOTE — Therapy (Signed)
[facility] [address] [place], [place], [address] Phone: [phone]   Fax:  [phone]  Physical Therapy Treatment  Patient Details  Name: Chad Flores MRN: [id number] Date of Birth: 1950/08/31 Referring Provider: Dr. [name]   Encounter Date: 06/24/2017  PT End of Session - 06/24/17 1047    Visit Number  27    Date for PT Re-Evaluation  06/27/17    Authorization Type  Medicare/Tricare    PT Start Time  1038 came late due to wrong time    PT Stop Time  1115    PT Time Calculation (min)  37 min    Activity Tolerance  Patient tolerated treatment well;No increased pain    Behavior During Therapy  WFL for tasks assessed/performed       Past Medical History:  Diagnosis Date  . Anemia   . Anxiety   . COPD (chronic obstructive pulmonary disease) ([place])   . Osteoarthritis     Past Surgical History:  Procedure Laterality Date  . CHOLECYSTECTOMY    . ELBOW SURGERY    . KNEE ARTHROPLASTY    . KNEE SURGERY    . TOTAL KNEE ARTHROPLASTY Left 03/22/2017  . TOTAL KNEE ARTHROPLASTY Left 03/22/2017   Procedure: LEFT TOTAL KNEE ARTHROPLASTY;  Surgeon: Garald Balding, MD;  Location: [place];  Service: Orthopedics;  Laterality: Left;  . TRIGGER FINGER RELEASE      There were no vitals filed for this visit.  Subjective Assessment - 06/24/17 1042    Subjective  I feel great today.     Pertinent History  Lt TKA: 03/22/17;  MD mid February    Limitations  Standing;Walking    How long can you stand comfortably?  Rt>Lt weightbearing    How long can you walk comfortably?  15 minutes with fatigue.  Using walker and 50% WB on the Lt    Patient Stated Goals  improve gait, reduce pain, improve Lt knee A/ROM, endurance    Currently in Pain?  No/denies         [facility] PT Assessment - 06/24/17 0001      Assessment   Medical Diagnosis  s/p Lt TKA    Referring Provider  Dr. [name]    Onset  Date/Surgical Date  03/22/17    Prior Therapy  home health PT until 04/06/17      Precautions   Precautions  None      Restrictions   Weight Bearing Restrictions  No      Home Environment   Living Environment  Private residence      Prior Function   Level of [place]  Retired      Associate Professor   Overall Cognitive Status  Within Functional Limits for tasks assessed      Observation/Other Assessments   Focus on Therapeutic Outcomes (FOTO)   19% limitation      Posture/Postural Control   Posture/Postural Control  No significant limitations      AROM   Left Knee Extension  0    Left Knee Flexion  115      PROM   Left Knee Extension  6    Left Knee Flexion  114      Strength   Right Hip Flexion  5/5    Right Knee Flexion  5/5    Right Knee Extension  5/5    Left Knee Flexion  5/5    Left  Knee Extension  5/5                  [facility] Adult PT Treatment/Exercise - 06/24/17 0001      Knee/Hip Exercises: Aerobic   Nustep  Level 4 x 8 minutes       Knee/Hip Exercises: Machines for Strengthening   Total Gym Leg Press  Seat 8: 120# 2x15 (foam pad), LLE only 65# 2x15 reps       Modalities   Modalities  Vasopneumatic      Vasopneumatic   Number Minutes Vasopneumatic   15 minutes    Vasopnuematic Location   Knee    Vasopneumatic Pressure  Medium    Vasopneumatic Temperature   15 minutes      Manual Therapy   Manual Therapy  Passive ROM    Passive ROM  left knee flexion on high low mat with mulligan strap mobilizing tibia anterior and therapist giving distraction               PT Short Term Goals - 06/03/17 1209      PT SHORT TERM GOAL #1   Title  be independent in initial HEP    Status  Achieved      PT SHORT TERM GOAL #2   Title  ambulate with full weight bearing on the Lt and use cane for household distances    Status  Achieved      PT SHORT TERM GOAL #3   Title  demonstrate Lt knee A/ROM flexion to > or = to 95  degrees to allow for sitting without substitution    Status  Achieved      PT SHORT TERM GOAL #4   Title  demonstrate 4/5 Lt knee strength to improve endurance and improve sit to stand    Status  Achieved        PT Long Term Goals - 06/24/17 1049      PT LONG TERM GOAL #1   Title  be independent in advanced HEP    Time  8    Period  Weeks    Status  Achieved      PT LONG TERM GOAL #2   Title  improve Lt LE strength to > or = to 5/5 to allow for ascending steps with step-over step gait and perform sit to stand without UE support    Time  8    Period  Weeks    Status  Achieved      PT LONG TERM GOAL #3   Title  reduce FOTO to < or = to 39% limitation    Time  8    Period  Weeks    Status  Achieved      PT LONG TERM GOAL #4   Title  demonstrate Lt knee A/ROM flexion to > or = to 115 degrees to allow for squatting     Time  8    Period  Weeks    Status  Achieved      PT LONG TERM GOAL #5   Title  wean from cane > or = to 50% of the time in the community and demonstrate symmetry on level surfaces    Time  8    Period  Weeks    Status  Achieved      PT LONG TERM GOAL #6   Title  report < or = to 3/10 Lt knee pain with standing and walking to improve independence with community and home  function    Time  8    Period  Weeks    Status  Achieved            Plan - 06/24/17 1047    Clinical Impression Statement  Patient has met his goals.  He has full strength of left knee.  Patient has 115 degrees of left knee passively.  Patient has 0 degrees of left knee extension passively.  Left knee strength is 5/5.  Patient is able to ambulate without an assistive device and is not limping.  Patient is independent with his HEP.  Patient is ready for discharge.     Rehab Potential  Good    Clinical Impairments Affecting Rehab Potential  left knee TKR 03/22/2017    PT Treatment/Interventions  ADLs/Self Care Home Management;Cryotherapy;Electrical Stimulation;Moist Heat;Gait  training;Stair training;Functional mobility training;Therapeutic activities;Therapeutic exercise;Patient/family education;Neuromuscular re-education;Scar mobilization;Passive range of motion;Vasopneumatic Device;Taping    PT Next Visit Plan  Discharge to HEP this visit    PT Home Exercise Plan  Current HEP    Consulted and Agree with Plan of Care  Patient       Patient will benefit from skilled therapeutic intervention in order to improve the following deficits and impairments:  Abnormal gait, Pain, Decreased mobility, Decreased scar mobility, Decreased strength, Decreased endurance, Decreased activity tolerance, Decreased range of motion, Difficulty walking, Increased edema, Impaired flexibility  Visit Diagnosis: Localized edema  Stiffness of left knee, not elsewhere classified  Muscle weakness (generalized)     Problem List Patient Active Problem List   Diagnosis Date Noted  . Primary osteoarthritis of left knee 03/22/2017  . S/P total knee replacement using cement, left 03/22/2017    Earlie Counts, PT 06/24/17 11:58 AM   [facility] [address], [place], [place], [address] Phone: [phone]   Fax:  [phone]  Name: JOSEALFREDO ADKINS MRN: [id number] Date of Birth: [birth year] PHYSICAL THERAPY DISCHARGE SUMMARY  Visits from Start of Care: 27  Current functional level related to goals / functional outcomes: See above.    Remaining deficits: See above.    Education / Equipment: HEP Plan: Patient agrees to discharge.  Patient goals were met. Patient is being discharged due to meeting the stated rehab goals.  Thank you for the referral. Earlie Counts, PT 06/24/17 11:58 AM  ?????

## 2017-06-27 ENCOUNTER — Ambulatory Visit (INDEPENDENT_AMBULATORY_CARE_PROVIDER_SITE_OTHER): Payer: Medicare Other | Admitting: Orthopaedic Surgery

## 2017-06-27 ENCOUNTER — Encounter (INDEPENDENT_AMBULATORY_CARE_PROVIDER_SITE_OTHER): Payer: Self-pay | Admitting: Orthopaedic Surgery

## 2017-06-27 VITALS — Resp 16 | Ht 70.0 in | Wt 185.0 lb

## 2017-06-27 DIAGNOSIS — Z96652 Presence of left artificial knee joint: Secondary | ICD-10-CM

## 2017-06-27 NOTE — Progress Notes (Signed)
Office Visit Note   Patient: Chad Flores           Date of Birth: [birth year]           MRN: [id number] Visit Date: 06/27/2017              Requested by: Darcus Austin, MD [place], [place] [address] PCP: Darcus Austin, MD   Assessment & Plan: Visit Diagnoses:  1. History of left knee replacement     Plan: 3 months status post primary left total knee replacement doing very well. Now working out with an exercise bicycle. Not taking any pain pills. Very happy with the results. We'll continue with his exercises and plan see him in 6 months  Follow-Up Instructions: Return in about 6 months (around 12/25/2017).   Orders:  No orders of the defined types were placed in this encounter.  No orders of the defined types were placed in this encounter.     Procedures: No procedures performed   Clinical Data: No additional findings.   Subjective: Chief Complaint  Patient presents with  . Left Knee - Routine Post Op    Chad Flores is a 67 y o  S/P 3 months left total knee replacement doing well.     HPI  Review of Systems  Constitutional: Negative for fatigue.  HENT: Positive for hearing loss.   Respiratory: Negative for apnea, chest tightness and shortness of breath.   Cardiovascular: Negative for chest pain, palpitations and leg swelling.  Gastrointestinal: Negative for blood in stool, constipation and diarrhea.  Genitourinary: Negative for difficulty urinating.  Musculoskeletal: Positive for arthralgias. Negative for back pain, joint swelling, myalgias, neck pain and neck stiffness.  Neurological: Negative for weakness, numbness and headaches.  Hematological: Does not bruise/bleed easily.  Psychiatric/Behavioral: Negative for sleep disturbance. The patient is not nervous/anxious.      Objective: Vital Signs: Resp 16   Ht 5\' 10"  (1.778 m)   Wt 185 lb (83.9 kg)   BMI 26.54 kg/m   Physical Exam  Ortho Exam awake alert and oriented  3. Comfortable. Full quick extension left knee. No effusion. Incisions healed very nicely. Little bit of thickening of this scar proximally but no pain. No instability. Flexed about 110. No calf pain. Neurovascular exam intact. No distal edema.  Specialty Comments:  No specialty comments available.  Imaging: No results found.   PMFS History: Patient Active Problem List   Diagnosis Date Noted  . Primary osteoarthritis of left knee 03/22/2017  . S/P total knee replacement using cement, left 03/22/2017   Past Medical History:  Diagnosis Date  . Anemia   . Anxiety   . COPD (chronic obstructive pulmonary disease) ([place])   . Osteoarthritis     Family History  Problem Relation Age of Onset  . Alzheimer's disease Mother   . Cancer Father     Past Surgical History:  Procedure Laterality Date  . CHOLECYSTECTOMY    . ELBOW SURGERY    . KNEE ARTHROPLASTY    . KNEE SURGERY    . TOTAL KNEE ARTHROPLASTY Left 03/22/2017  . TOTAL KNEE ARTHROPLASTY Left 03/22/2017   Procedure: LEFT TOTAL KNEE ARTHROPLASTY;  Surgeon: Garald Balding, MD;  Location: [facility];  Service: Orthopedics;  Laterality: Left;  . TRIGGER FINGER RELEASE     Social History   Occupational History  . Not on file  Tobacco Use  . Smoking status: Former Smoker    Packs/day: 1.00  Years: 52.50    Pack years: 52.50    Types: Cigarettes  . Smokeless tobacco: Never Used  . Tobacco comment: Now uses e cigarettes. Encouraged to quit smoking completely.  Substance and Sexual Activity  . Alcohol use: Yes    Alcohol/week: 0.6 oz    Types: 1 Standard drinks or equivalent per week    Comment: occ  . Drug use: No  . Sexual activity: Not on file

## 2017-06-28 ENCOUNTER — Ambulatory Visit
Admission: RE | Admit: 2017-06-28 | Discharge: 2017-06-28 | Disposition: A | Discharge status: Home / Self Care | Payer: Medicare Other | Source: Ambulatory Visit | Attending: Family Medicine | Admitting: Family Medicine

## 2017-06-28 DIAGNOSIS — I719 Aortic aneurysm of unspecified site, without rupture: Secondary | ICD-10-CM

## 2017-06-28 DIAGNOSIS — I714 Abdominal aortic aneurysm, without rupture: Secondary | ICD-10-CM | POA: Diagnosis not present

## 2017-07-26 DIAGNOSIS — D3132 Benign neoplasm of left choroid: Secondary | ICD-10-CM | POA: Diagnosis not present

## 2017-07-26 DIAGNOSIS — H2513 Age-related nuclear cataract, bilateral: Secondary | ICD-10-CM | POA: Diagnosis not present

## 2017-08-01 ENCOUNTER — Ambulatory Visit (INDEPENDENT_AMBULATORY_CARE_PROVIDER_SITE_OTHER)
Admission: RE | Admit: 2017-08-01 | Discharge: 2017-08-01 | Disposition: A | Discharge status: Home / Self Care | Payer: Medicare Other | Source: Ambulatory Visit | Attending: Acute Care | Admitting: Acute Care

## 2017-08-01 DIAGNOSIS — Z87891 Personal history of nicotine dependence: Secondary | ICD-10-CM | POA: Diagnosis not present

## 2017-08-03 ENCOUNTER — Other Ambulatory Visit: Payer: Self-pay | Admitting: Acute Care

## 2017-08-03 DIAGNOSIS — Z87891 Personal history of nicotine dependence: Secondary | ICD-10-CM

## 2017-08-03 DIAGNOSIS — Z122 Encounter for screening for malignant neoplasm of respiratory organs: Secondary | ICD-10-CM

## 2017-09-13 NOTE — Telephone Encounter (Signed)
No call done

## 2017-09-21 DIAGNOSIS — J441 Chronic obstructive pulmonary disease with (acute) exacerbation: Secondary | ICD-10-CM | POA: Diagnosis not present

## 2017-12-17 DIAGNOSIS — L821 Other seborrheic keratosis: Secondary | ICD-10-CM | POA: Diagnosis not present

## 2017-12-17 DIAGNOSIS — C44329 Squamous cell carcinoma of skin of other parts of face: Secondary | ICD-10-CM | POA: Diagnosis not present

## 2017-12-17 DIAGNOSIS — D485 Neoplasm of uncertain behavior of skin: Secondary | ICD-10-CM | POA: Diagnosis not present

## 2017-12-17 DIAGNOSIS — L814 Other melanin hyperpigmentation: Secondary | ICD-10-CM | POA: Diagnosis not present

## 2017-12-17 DIAGNOSIS — D225 Melanocytic nevi of trunk: Secondary | ICD-10-CM | POA: Diagnosis not present

## 2017-12-17 DIAGNOSIS — L570 Actinic keratosis: Secondary | ICD-10-CM | POA: Diagnosis not present

## 2017-12-26 ENCOUNTER — Ambulatory Visit (INDEPENDENT_AMBULATORY_CARE_PROVIDER_SITE_OTHER): Payer: Medicare Other | Admitting: Orthopaedic Surgery

## 2018-01-05 ENCOUNTER — Encounter: Payer: Self-pay | Admitting: Family Medicine

## 2018-01-05 DIAGNOSIS — E7800 Pure hypercholesterolemia, unspecified: Secondary | ICD-10-CM | POA: Diagnosis not present

## 2018-01-05 DIAGNOSIS — F321 Major depressive disorder, single episode, moderate: Secondary | ICD-10-CM | POA: Diagnosis not present

## 2018-01-05 DIAGNOSIS — J439 Emphysema, unspecified: Secondary | ICD-10-CM | POA: Diagnosis not present

## 2018-01-05 DIAGNOSIS — R7303 Prediabetes: Secondary | ICD-10-CM | POA: Diagnosis not present

## 2018-01-06 ENCOUNTER — Encounter (INDEPENDENT_AMBULATORY_CARE_PROVIDER_SITE_OTHER): Payer: Self-pay | Admitting: Orthopaedic Surgery

## 2018-01-06 ENCOUNTER — Ambulatory Visit (INDEPENDENT_AMBULATORY_CARE_PROVIDER_SITE_OTHER): Payer: Medicare Other | Admitting: Orthopaedic Surgery

## 2018-01-06 VITALS — BP 103/66 | HR 75 | Ht 70.0 in | Wt 210.0 lb

## 2018-01-06 DIAGNOSIS — Z96652 Presence of left artificial knee joint: Secondary | ICD-10-CM

## 2018-01-06 NOTE — Progress Notes (Signed)
Office Visit Note   Patient: Chad Flores           Date of Birth: 04-02-1951           MRN: [id number] Visit Date: 01/06/2018              Requested by: Chad Austin, MD [place], [place] [address] PCP: Chad Austin, MD   Assessment & Plan: Visit Diagnoses:  1. History of left knee replacement   2. S/P total knee replacement using cement, left     Plan: 9 months status post primary left total knee replacement and doing well.  No issues.  Occasionally has little stiffness but nothing that interferes with his activities.  We will urge him to continue with his exercises and return to see me as needed.  Follow-Up Instructions: Return if symptoms worsen or fail to improve.   Orders:  No orders of the defined types were placed in this encounter.  No orders of the defined types were placed in this encounter.     Procedures: No procedures performed   Clinical Data: No additional findings.   Subjective: Chief Complaint  Patient presents with  . Follow-up    6 MO F/U L KNEE TOTAL REPL, NO ISSUES STILL HAS SOEM SWELLING FROM TIME TO TIME  Chad Flores is 67 years old and 68-month status post primary left total knee replacement.  He has not had any problems.  Very happy with the results.  He denies any fever chills, shortness of breath or chest pain.  No evidence of instability.  He is able to perform his activities as normal.  HPI  Review of Systems  Constitutional: Negative for fatigue and fever.  HENT: Negative for ear pain.   Eyes: Negative for pain.  Respiratory: Negative for cough and shortness of breath.   Cardiovascular: Positive for leg swelling.  Gastrointestinal: Negative for constipation and diarrhea.  Genitourinary: Negative for difficulty urinating.  Musculoskeletal: Negative for back pain and neck pain.  Skin: Negative for rash.  Allergic/Immunologic: Negative for food allergies.  Neurological: Negative for weakness and  numbness.  Hematological: Does not bruise/bleed easily.  Psychiatric/Behavioral: Negative for sleep disturbance.     Objective: Vital Signs: BP 103/66 (BP Location: Left Arm, Patient Position: Sitting, Cuff Size: Normal)   Pulse 75   Ht 5\' 10"  (1.778 m)   Wt 210 lb (95.3 kg)   BMI 30.13 kg/m   Physical Exam  Constitutional: He is oriented to person, place, and time. He appears well-developed and well-nourished.  HENT:  Mouth/Throat: Oropharynx is clear and moist.  Eyes: Pupils are equal, round, and reactive to light. EOM are normal.  Pulmonary/Chest: Effort normal.  Neurological: He is alert and oriented to person, place, and time.  Skin: Skin is warm and dry.  Psychiatric: He has a normal mood and affect. His behavior is normal.    Ortho Exam awake alert and oriented x3.  Comfortable sitting left knee with full quick extension.  No effusion.  No increased heat.  No instability with varus valgus stress.  Flexed 115 degrees with a goniometer.  No popliteal pain.  No distal edema.  Good pulses distally.  Straight leg raise negative.  Painless range of motion left hip .old incision has healed nicely.  No localized tenderness  Specialty Comments:  No specialty comments available.  Imaging: No results found.   PMFS History: Patient Active Problem List   Diagnosis Date Noted  . Primary osteoarthritis  of left knee 03/22/2017  . S/P total knee replacement using cement, left 03/22/2017   Past Medical History:  Diagnosis Date  . Anemia   . Anxiety   . COPD (chronic obstructive pulmonary disease) ([place])   . Osteoarthritis     Family History  Problem Relation Age of Onset  . Alzheimer's disease Mother   . Cancer Father     Past Surgical History:  Procedure Laterality Date  . CHOLECYSTECTOMY    . ELBOW SURGERY    . KNEE ARTHROPLASTY    . KNEE SURGERY    . TOTAL KNEE ARTHROPLASTY Left 03/22/2017  . TOTAL KNEE ARTHROPLASTY Left 03/22/2017   Procedure: LEFT TOTAL KNEE  ARTHROPLASTY;  Surgeon: Garald Balding, MD;  Location: [place];  Service: Orthopedics;  Laterality: Left;  . TRIGGER FINGER RELEASE     Social History   Occupational History  . Not on file  Tobacco Use  . Smoking status: Former Smoker    Packs/day: 1.00    Years: 52.50    Pack years: 52.50    Types: Cigarettes  . Smokeless tobacco: Never Used  . Tobacco comment: Now uses e cigarettes. Encouraged to quit smoking completely.  Substance and Sexual Activity  . Alcohol use: Yes    Alcohol/week: 1.0 standard drinks    Types: 1 Standard drinks or equivalent per week    Comment: occ  . Drug use: No  . Sexual activity: Not on file

## 2018-01-10 DIAGNOSIS — S90852A Superficial foreign body, left foot, initial encounter: Secondary | ICD-10-CM | POA: Diagnosis not present

## 2018-01-16 DIAGNOSIS — Z23 Encounter for immunization: Secondary | ICD-10-CM | POA: Diagnosis not present

## 2018-01-16 DIAGNOSIS — T148XXA Other injury of unspecified body region, initial encounter: Secondary | ICD-10-CM | POA: Diagnosis not present

## 2018-01-16 DIAGNOSIS — L089 Local infection of the skin and subcutaneous tissue, unspecified: Secondary | ICD-10-CM | POA: Diagnosis not present

## 2018-02-14 DIAGNOSIS — C44329 Squamous cell carcinoma of skin of other parts of face: Secondary | ICD-10-CM | POA: Diagnosis not present

## 2018-02-23 ENCOUNTER — Encounter: Payer: Self-pay | Admitting: Registered"

## 2018-02-23 ENCOUNTER — Encounter: Payer: Medicare Other | Attending: Family Medicine | Admitting: Registered"

## 2018-02-23 DIAGNOSIS — E119 Type 2 diabetes mellitus without complications: Secondary | ICD-10-CM | POA: Insufficient documentation

## 2018-02-23 DIAGNOSIS — Z713 Dietary counseling and surveillance: Secondary | ICD-10-CM | POA: Insufficient documentation

## 2018-02-23 NOTE — Patient Instructions (Addendum)
Goals:  Follow Diabetes Meal Plan as instructed  Eat 3 meals and 2 snacks, every 3-5 hrs  Limit carbohydrate intake to 45-60 grams carbohydrate/meal  Limit carbohydrate intake to 15-30 grams carbohydrate/snack  Add lean protein foods to meals/snacks  Monitor glucose levels as instructed by your doctor  Aim for 30 mins of physical activity at least 2 days a week  Bring food record and glucose log to your next nutrition visit  Aim to eat breakfast within 2-3 hours of waking up.

## 2018-02-23 NOTE — Progress Notes (Signed)
Diabetes Self-Management Education  Visit Type:  First/Initial  Appt. Start Time: 11:00 Appt. End Time: 11:55  02/23/2018  Chad Flores, identified by name and date of birth, is a 67 y.o. male with a diagnosis of Diabetes: Type 2.   ASSESSMENT  Pt expectations: wants to drop about 10 lbs  Pt arrives with wife. Pt states he takes his grandson to school every morning and will have coffee in the morning. Pt states he wakes up at 6 am and eats breakfast around 10 am. Pt states they take monthly trips with church group to different areas. Pt states it is hard to choose foods when eating out.    There were no vitals taken for this visit. There is no height or weight on file to calculate BMI.   Diabetes Self-Management Education - 02/23/18 1020      Health Coping   How would you rate your overall health?  Good      Psychosocial Assessment   Patient Belief/Attitude about Diabetes  Motivated to manage diabetes    Self-care barriers  None    Self-management support  Doctor's office;Family    Patient Concerns  Nutrition/Meal planning;Weight Control    Special Needs  None    Preferred Learning Style  No preference indicated    Learning Readiness  Ready      Complications   Last HgB A1C per patient/outside source  6.7 %    How often do you check your blood sugar?  0 times/day (not testing)    Number of hypoglycemic episodes per month  0    Number of hyperglycemic episodes per week  0    Have you had a dilated eye exam in the past 12 months?  Yes    Have you had a dental exam in the past 12 months?  Yes    Are you checking your feet?  No      Dietary Intake   Breakfast  typically skips; coffee    Snack (morning)  bacon + eggs or cornbeef hash or sausage biscuit     Lunch  cinnamon raisin bagel    Snack (afternoon)  peanuts    Dinner  chicken/steak/pork chops + mashed potatoes + green beans    Snack (evening)   peanuts or popcorn    Beverage(s)  coffee, mountain dew, 2% milk +  hershey's chocolate syrup, water      Exercise   Exercise Type  ADL's    How many days per week to you exercise?  0    How many minutes per day do you exercise?  0    Total minutes per week of exercise  0      Patient Education   Disease state   Definition of diabetes, type 1 and 2, and the diagnosis of diabetes;Factors that contribute to the development of diabetes    Nutrition management   Food label reading, portion sizes and measuring food.;Role of diet in the treatment of diabetes and the relationship between the three main macronutrients and blood glucose level;Carbohydrate counting;Meal timing in regards to the patients' current diabetes medication.;Reviewed blood glucose goals for pre and post meals and how to evaluate the patients' food intake on their blood glucose level.;Meal options for control of blood glucose level and chronic complications.    Physical activity and exercise   Role of exercise on diabetes management, blood pressure control and cardiac health.;Helped patient identify appropriate exercises in relation to his/her diabetes, diabetes complications and other health  issue.    Acute complications  Taught treatment of hypoglycemia - the 15 rule.;Discussed and identified patients' treatment of hyperglycemia.    Chronic complications  Relationship between chronic complications and blood glucose control;Assessed and discussed foot care and prevention of foot problems;Lipid levels, blood glucose control and heart disease;Dental care;Nephropathy, what it is, prevention of, the use of ACE, ARB's and early detection of through urine microalbumia.;Reviewed with patient heart disease, higher risk of, and prevention;Retinopathy and reason for yearly dilated eye exams;Identified and discussed with patient  current chronic complications    Psychosocial adjustment  Role of stress on diabetes;Helped patient identify a support system for diabetes management      Individualized Goals (developed  by patient)   Nutrition  Follow meal plan discussed    Physical Activity  Exercise 1-2 times per week;30 minutes per day    Medications  Not Applicable    Monitoring   test my blood glucose as discussed    Reducing Risk  do foot checks daily;treat hypoglycemia with 15 grams of carbs if blood glucose less than 70mg /dL      Post-Education Assessment   Patient understands the diabetes disease and treatment process.  Demonstrates understanding / competency    Patient understands incorporating nutritional management into lifestyle.  Demonstrates understanding / competency    Patient undertands incorporating physical activity into lifestyle.  Demonstrates understanding / competency    Patient understands using medications safely.  Demonstrates understanding / competency    Patient understands monitoring blood glucose, interpreting and using results  Demonstrates understanding / competency    Patient understands prevention, detection, and treatment of acute complications.  Demonstrates understanding / competency    Patient understands prevention, detection, and treatment of chronic complications.  Demonstrates understanding / competency    Patient understands how to develop strategies to address psychosocial issues.  Demonstrates understanding / competency    Patient understands how to develop strategies to promote health/change behavior.  Demonstrates understanding / competency      Outcomes   Program Status  Completed       Learning Objective:  Patient will have a greater understanding of diabetes self-management. Patient education plan is to attend individual and/or group sessions per assessed needs and concerns.   Plan:   Patient Instructions  Goals:  Follow Diabetes Meal Plan as instructed  Eat 3 meals and 2 snacks, every 3-5 hrs  Limit carbohydrate intake to 45-60 grams carbohydrate/meal  Limit carbohydrate intake to 15-30 grams carbohydrate/snack  Add lean protein foods to  meals/snacks  Monitor glucose levels as instructed by your doctor  Aim for 30 mins of physical activity at least 2 days a week  Bring food record and glucose log to your next nutrition visit  Aim to eat breakfast within 2-3 hours of waking up.       Expected Outcomes:  Demonstrated interest in learning. Expect positive outcomes  Education material provided: ADA Diabetes: Your Take Control Guide, Support group flyer and Carbohydrate counting sheet  If problems or questions, patient to contact team via:  Phone and Email  Future DSME appointment: - Yearly

## 2018-04-12 DIAGNOSIS — J441 Chronic obstructive pulmonary disease with (acute) exacerbation: Secondary | ICD-10-CM | POA: Diagnosis not present

## 2018-06-12 DIAGNOSIS — E1169 Type 2 diabetes mellitus with other specified complication: Secondary | ICD-10-CM | POA: Diagnosis not present

## 2018-06-12 DIAGNOSIS — E7800 Pure hypercholesterolemia, unspecified: Secondary | ICD-10-CM | POA: Diagnosis not present

## 2018-06-12 DIAGNOSIS — Z125 Encounter for screening for malignant neoplasm of prostate: Secondary | ICD-10-CM | POA: Diagnosis not present

## 2018-06-12 DIAGNOSIS — F321 Major depressive disorder, single episode, moderate: Secondary | ICD-10-CM | POA: Diagnosis not present

## 2018-06-12 DIAGNOSIS — J439 Emphysema, unspecified: Secondary | ICD-10-CM | POA: Diagnosis not present

## 2018-08-03 ENCOUNTER — Inpatient Hospital Stay: Admission: RE | Admit: 2018-08-03 | Payer: Medicare Other | Source: Ambulatory Visit

## 2018-09-25 ENCOUNTER — Other Ambulatory Visit: Payer: Self-pay | Admitting: Acute Care

## 2018-09-25 DIAGNOSIS — Z122 Encounter for screening for malignant neoplasm of respiratory organs: Secondary | ICD-10-CM

## 2018-09-25 DIAGNOSIS — Z87891 Personal history of nicotine dependence: Secondary | ICD-10-CM

## 2018-10-20 ENCOUNTER — Telehealth: Payer: Self-pay | Admitting: *Deleted

## 2018-10-20 NOTE — Telephone Encounter (Signed)

## 2018-10-23 ENCOUNTER — Ambulatory Visit (INDEPENDENT_AMBULATORY_CARE_PROVIDER_SITE_OTHER)
Admission: RE | Admit: 2018-10-23 | Discharge: 2018-10-23 | Disposition: A | Discharge status: Home / Self Care | Payer: Medicare Other | Source: Ambulatory Visit | Attending: Acute Care | Admitting: Acute Care

## 2018-10-23 ENCOUNTER — Other Ambulatory Visit: Payer: Self-pay

## 2018-10-23 DIAGNOSIS — Z87891 Personal history of nicotine dependence: Secondary | ICD-10-CM | POA: Diagnosis not present

## 2018-10-23 DIAGNOSIS — F17210 Nicotine dependence, cigarettes, uncomplicated: Secondary | ICD-10-CM | POA: Diagnosis not present

## 2018-10-23 DIAGNOSIS — Z122 Encounter for screening for malignant neoplasm of respiratory organs: Secondary | ICD-10-CM

## 2018-10-27 ENCOUNTER — Other Ambulatory Visit: Payer: Self-pay | Admitting: Acute Care

## 2018-10-27 DIAGNOSIS — Z122 Encounter for screening for malignant neoplasm of respiratory organs: Secondary | ICD-10-CM

## 2018-10-27 DIAGNOSIS — Z87891 Personal history of nicotine dependence: Secondary | ICD-10-CM

## 2018-10-30 IMAGING — US US AORTA
1 series · 14 of 22 positions shown · non-contrast
Comparison: 06/19/2015 ultrasound.

CLINICAL DATA: 66-year-old male with aortic aneurysm. Subsequent
encounter.

EXAM:
ULTRASOUND OF ABDOMINAL AORTA
TECHNIQUE: Ultrasound examination of the abdominal aorta was performed to
evaluate for abdominal aortic aneurysm.

[Series 1: us aorta · 0.28mm/px · 14 of 22 slices shown]
[im 1/22]
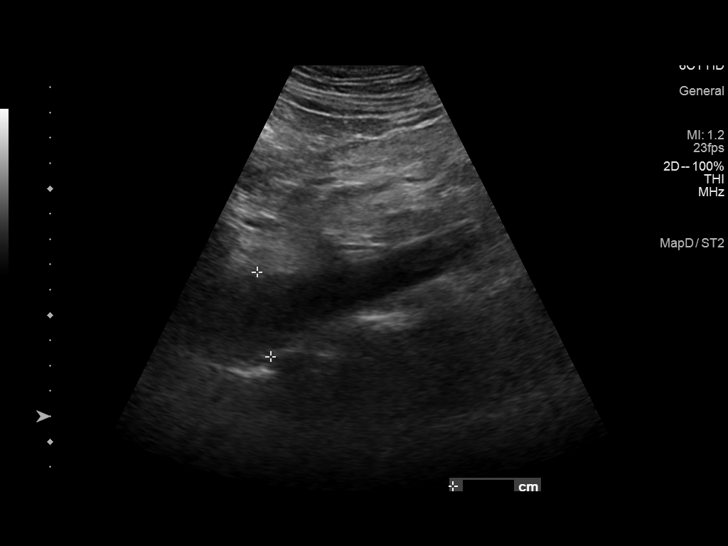
[im 3/22]
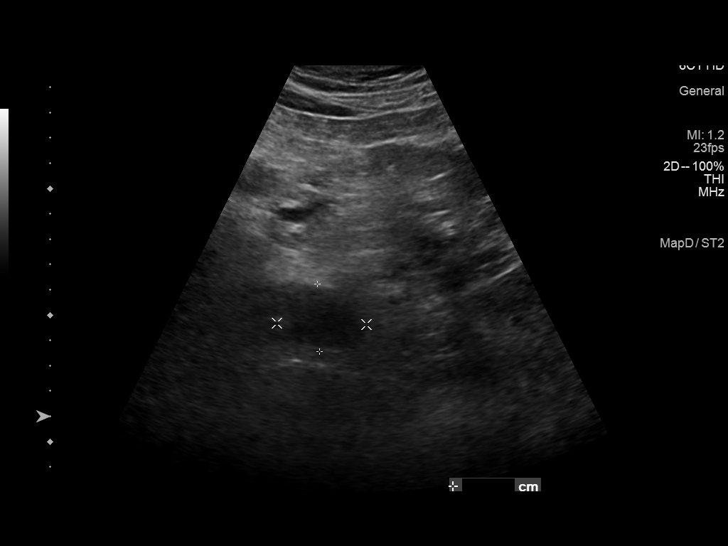
[im 4/22]
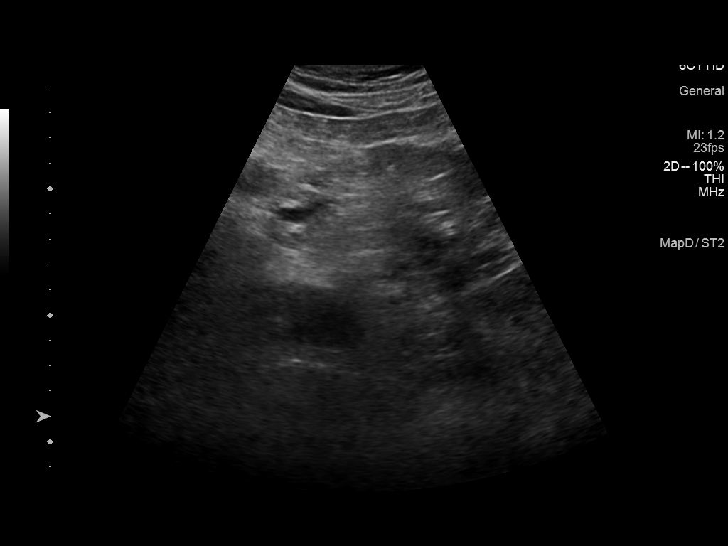
[im 6/22]
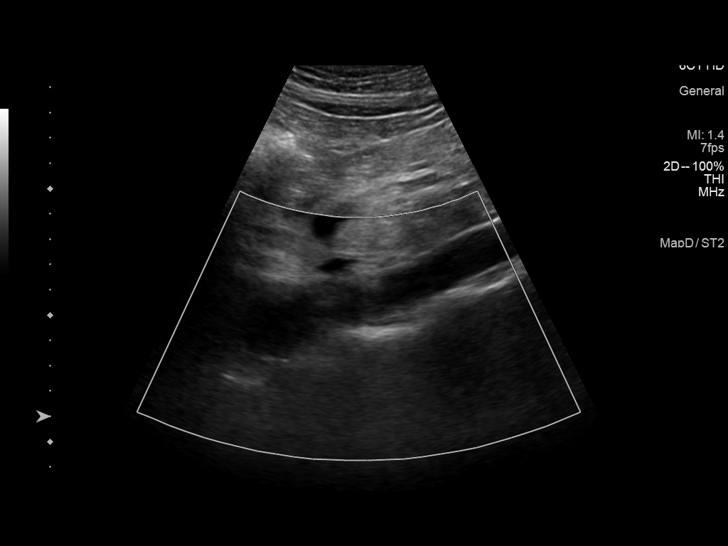
[im 8/22]
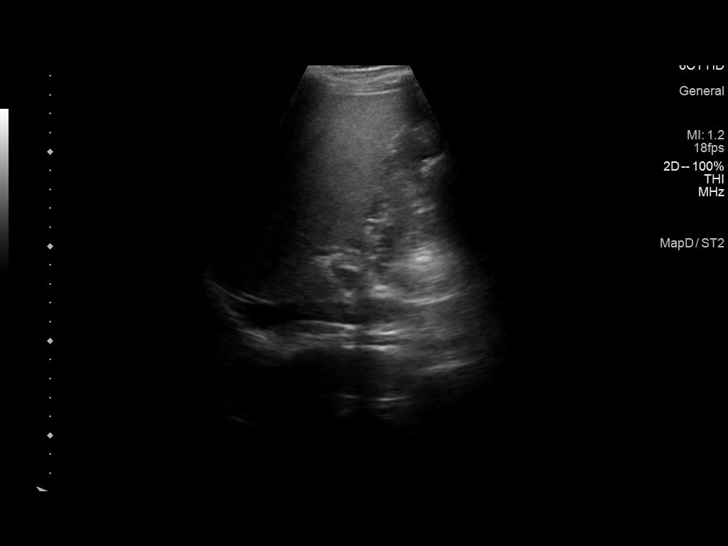
[im 9/22]
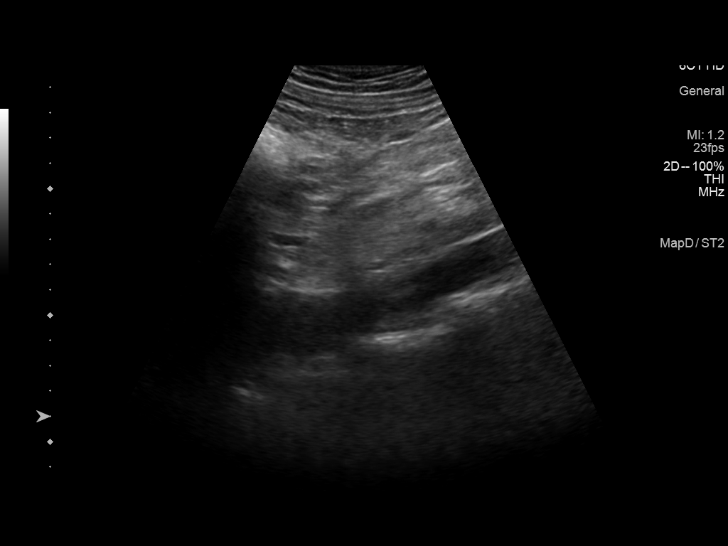
[im 11/22]
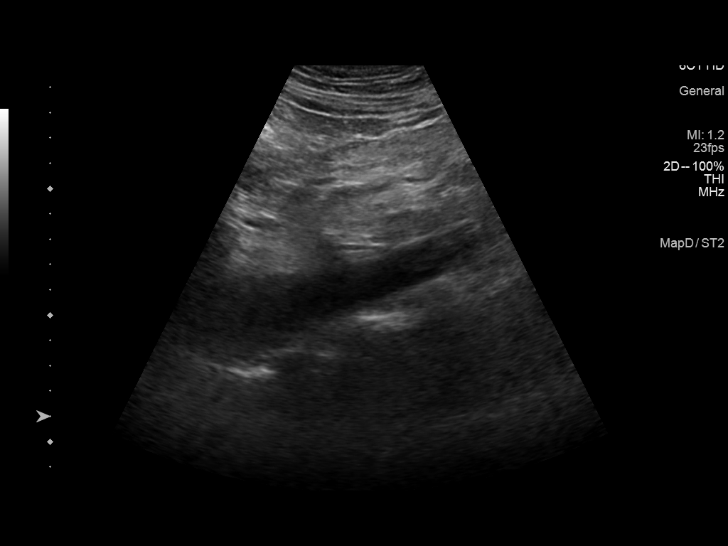
[im 12/22]
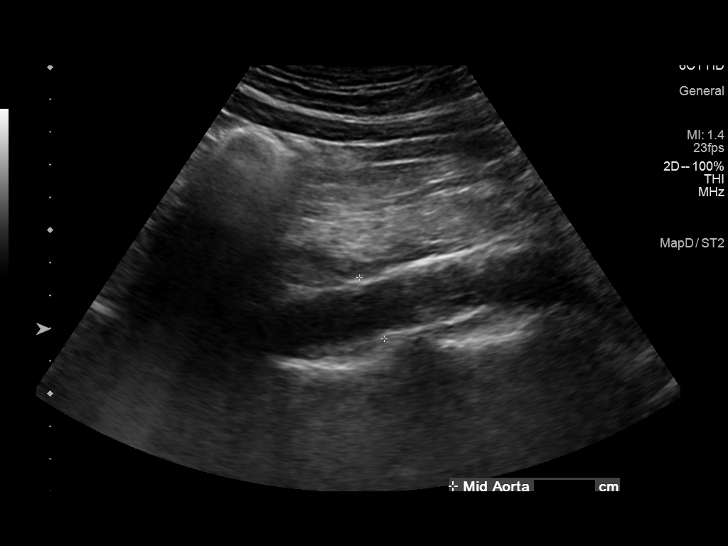
[im 14/22]
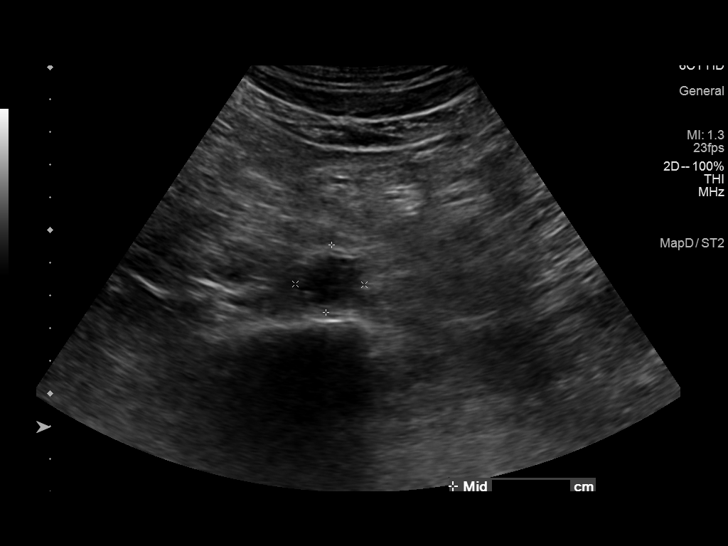
[im 15/22]
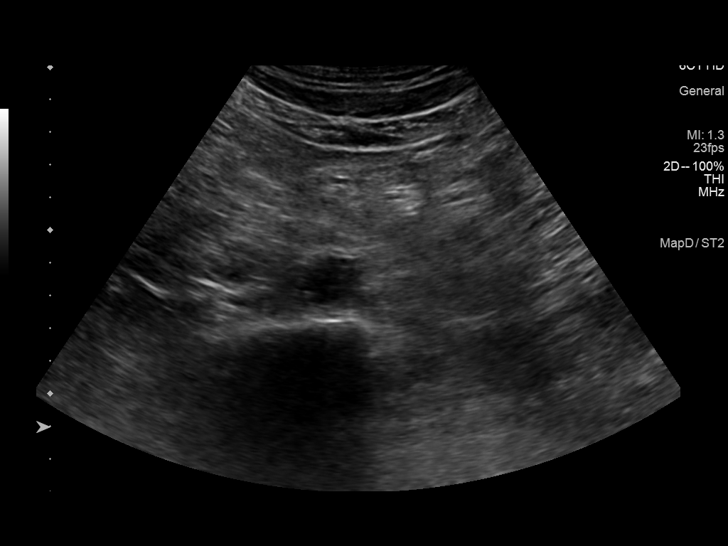
[im 17/22]
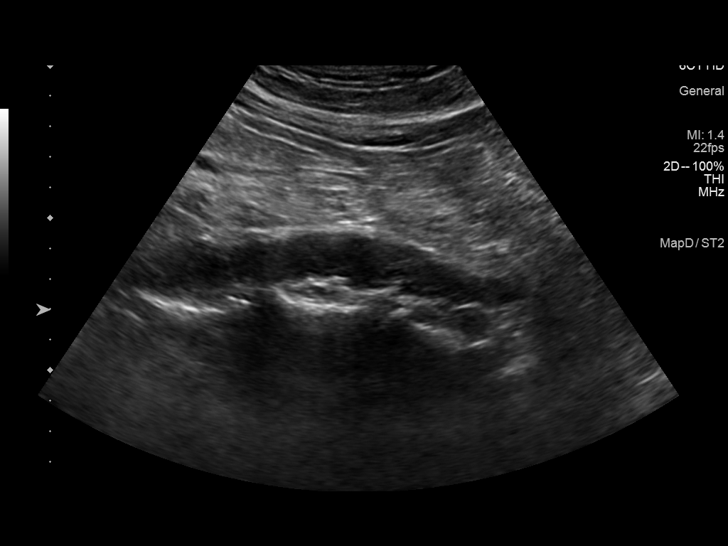
[im 19/22]
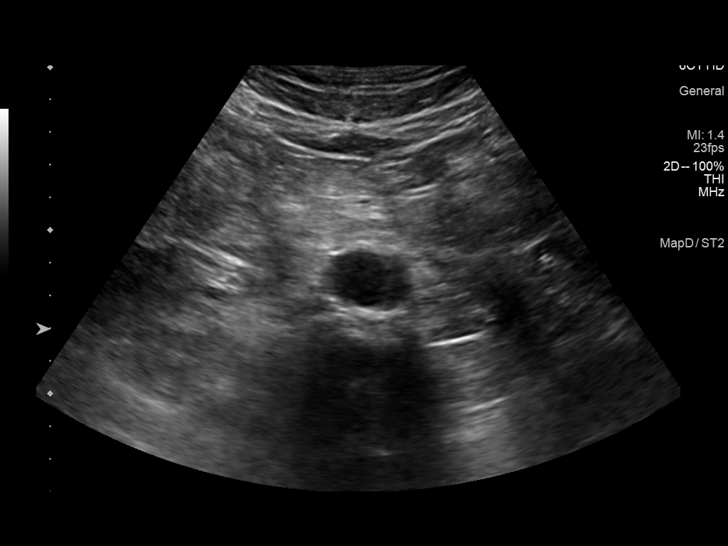
[im 20/22]
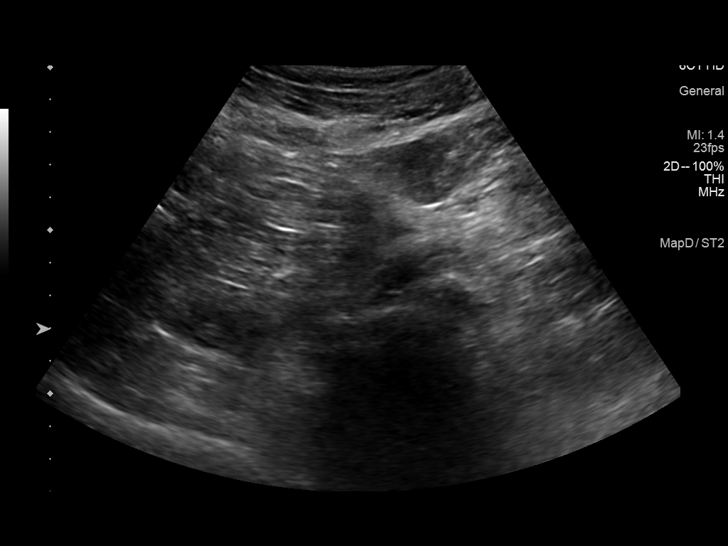
[im 22/22]
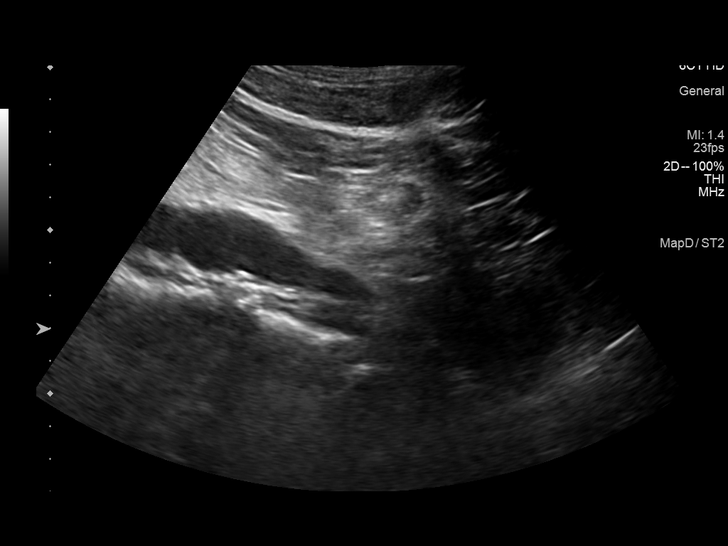

[14 of 22 positions shown; findings below may reference images not displayed]

FINDINGS: Abdominal aortic measurements as follows:

Proximal: Currently 3.5 x 3.4 cm and previously 3.5 x 3.5 cm.

Mid:  Currently 2.1 x 2.1 cm versus prior 2.5 x 2.3 cm.

Distal: Currently 3 x 2.7 cm cm versus prior 2.9 x 2.9 cm.

Prominent plaque.

Iliac arteries not adequately assessed. Examination difficult
secondary to bowel gas.
IMPRESSION: Abdominal aortic aneurysm with maximal transverse dimension of 3.5 x
3.4 cm without significant change from prior exam as detailed above.
Recommend followup by ultrasound in 2 years. This recommendation
follows ACR consensus guidelines: White Paper of the ACR Incidental
Findings Committee II on Vascular Findings. [HOSPITAL] 4397;
[DATE].

Aortic Atherosclerosis ([id number].H).

## 2018-11-15 ENCOUNTER — Encounter: Payer: Self-pay | Admitting: Orthopedic Surgery

## 2018-11-15 ENCOUNTER — Ambulatory Visit (INDEPENDENT_AMBULATORY_CARE_PROVIDER_SITE_OTHER): Payer: Medicare Other | Admitting: Orthopedic Surgery

## 2018-11-15 ENCOUNTER — Ambulatory Visit (INDEPENDENT_AMBULATORY_CARE_PROVIDER_SITE_OTHER): Payer: Medicare Other

## 2018-11-15 ENCOUNTER — Other Ambulatory Visit: Payer: Self-pay

## 2018-11-15 VITALS — BP 101/70 | HR 70 | Resp 13 | Ht 70.0 in | Wt 208.0 lb

## 2018-11-15 DIAGNOSIS — M25522 Pain in left elbow: Secondary | ICD-10-CM | POA: Diagnosis not present

## 2018-11-15 DIAGNOSIS — M7711 Lateral epicondylitis, right elbow: Secondary | ICD-10-CM | POA: Insufficient documentation

## 2018-11-15 MED ORDER — BUPIVACAINE HCL 0.25 % IJ SOLN
0.6600 mL | INTRAMUSCULAR | Status: AC | PRN
  Administered 2018-11-15: 13:00:00 .66 mL via INTRA_ARTICULAR

## 2018-11-15 MED ORDER — LIDOCAINE HCL 1 % IJ SOLN
1.0000 mL | INTRAMUSCULAR | Status: AC | PRN
Start: 2018-11-15 — End: 2018-11-15
  Administered 2018-11-15: 1 mL

## 2018-11-15 MED ORDER — METHYLPREDNISOLONE ACETATE 40 MG/ML IJ SUSP
13.3300 mg | INTRAMUSCULAR | Status: AC | PRN
  Administered 2018-11-15: 13.33 mg via INTRA_ARTICULAR

## 2018-11-15 NOTE — Progress Notes (Signed)
Office Visit Note   Patient: Chad Flores           Date of Birth: 1951-02-12           MRN: [id number] Visit Date: 11/15/2018              Requested by: No referring provider defined for this encounter. PCP: Darcus Austin, MD (Inactive)   Assessment & Plan: Visit Diagnoses:  1. Pain in left elbow   2. Lateral epicondylitis, right elbow     Plan:  #1: Corticosteroid injection to the lateral epicondylar area of the right elbow #2: He can obtain Voltaren gel and also use this over this area.   #3: Follow back up for reevaluation if this is not helpful.   Follow-Up Instructions: Return if symptoms worsen or fail to improve.   Orders:  Orders Placed This Encounter  Procedures  . Medium Joint Inj  . XR Elbow 2 Views Right   No orders of the defined types were placed in this encounter.     Procedures: Medium Joint Inj: R lateral epicondyle on 11/15/2018 12:58 PM Details: 25 G 1.5 in needle, anterolateral approach Medications: 1 mL lidocaine 1 %; 0.66 mL bupivacaine 0.25 %; 13.33 mg methylPREDNISolone acetate 40 MG/ML      Clinical Data: No additional findings.   Subjective: Chief Complaint  Patient presents with  . Right Elbow - Pain   HPI Patient presents in the office today with right elbow pain. Patient states the onset of pain was a couple of months ago. Patient has been using heat compresses and voltaren gel to affected area. Patient states the pain comes and goes. Patient states the pain at worst is 8 out of 10.  Patient denies any injury.    Review of Systems  Constitutional: Negative for fatigue and fever.  HENT: Negative for ear pain.   Eyes: Negative for pain.  Respiratory: Negative for cough and shortness of breath.   Cardiovascular: Positive for leg swelling.  Gastrointestinal: Negative for constipation and diarrhea.  Genitourinary: Negative for difficulty urinating.  Musculoskeletal: Negative for back pain and neck pain.  Skin: Negative for  rash.  Allergic/Immunologic: Negative for food allergies.  Neurological: Negative for weakness and numbness.  Hematological: Does not bruise/bleed easily.  Psychiatric/Behavioral: Negative for sleep disturbance.     Objective: Vital Signs: BP 101/70 (BP Location: Left Arm, Patient Position: Sitting, Cuff Size: Normal)   Pulse 70   Resp 13   Ht 5\' 10"  (1.778 m)   Wt 208 lb (94.3 kg)   BMI 29.84 kg/m   Physical Exam Constitutional:      Appearance: Normal appearance. He is well-developed.  HENT:     Head: Normocephalic.  Eyes:     Pupils: Pupils are equal, round, and reactive to light.  Pulmonary:     Effort: Pulmonary effort is normal.  Skin:    General: Skin is warm and dry.  Neurological:     Mental Status: He is alert and oriented to person, place, and time.  Psychiatric:        Behavior: Behavior normal.     Ortho Exam  Exam today reveals tenderness over the lateral epicondylar area.  He does have some tenderness over the anterior portion of this also.  He does have pain with resistance against dorsiflexion of the wrists and thumb at the elbow.  Good strength.  Neurovascular intact distally.  Specialty Comments:  No specialty comments available.  Imaging: Xr Elbow 2 Views  Right  Result Date: 11/15/2018 2 view x-ray of the right elbow does reveal some spurring about the lateral epicondylar area.  Cystic changes noted in the radial head along the lateral aspect of the AP.  Some anterior spurring over the anterior radius.  No crepitance.  Motion is good with full extension and flexion.    PMFS History: Current Outpatient Medications  Medication Sig Dispense Refill  . acetaminophen (TYLENOL) 325 MG tablet Take 2 tablets (650 mg total) every 6 (six) hours as needed by mouth.    Marland Kitchen aspirin EC 81 MG tablet Take 81 mg by mouth daily.    Marland Kitchen atorvastatin (LIPITOR) 10 MG tablet Take 10 mg by mouth daily.  1  . buPROPion (WELLBUTRIN XL) 150 MG 24 hr tablet Take 150 mg by  mouth in the morning  1  . Fluticasone-Salmeterol (ADVAIR) 250-50 MCG/DOSE AEPB Inhale 1 puff into the lungs 2 (two) times daily.    . vitamin B-12 (CYANOCOBALAMIN) 1000 MCG tablet Take 1,000 mcg by mouth daily.    . methocarbamol (ROBAXIN) 500 MG tablet Take 1 tablet (500 mg total) by mouth every 6 (six) hours as needed for muscle spasms. (Patient not taking: Reported on 06/27/2017) 40 tablet 0  . oxyCODONE (OXY IR/ROXICODONE) 5 MG immediate release tablet Take 1 tablet (5 mg total) by mouth every 12 (twelve) hours as needed for moderate pain or severe pain. (Patient not taking: Reported on 06/27/2017) 30 tablet 0  . rivaroxaban (XARELTO) 10 MG TABS tablet Take 1 tablet (10 mg total) daily with breakfast by mouth. (Patient not taking: Reported on 04/19/2017) 10 tablet 0   No current facility-administered medications for this visit.     Patient Active Problem List   Diagnosis Date Noted  . Primary osteoarthritis of left knee 03/22/2017  . S/P total knee replacement using cement, left 03/22/2017   Past Medical History:  Diagnosis Date  . Anemia   . Anxiety   . COPD (chronic obstructive pulmonary disease) ([place])   . Diabetes mellitus without complication ([place])   . Osteoarthritis     Family History  Problem Relation Age of Onset  . Alzheimer's disease Mother   . Cancer Father     Past Surgical History:  Procedure Laterality Date  . CHOLECYSTECTOMY    . ELBOW SURGERY    . KNEE ARTHROPLASTY    . KNEE SURGERY    . TOTAL KNEE ARTHROPLASTY Left 03/22/2017  . TOTAL KNEE ARTHROPLASTY Left 03/22/2017   Procedure: LEFT TOTAL KNEE ARTHROPLASTY;  Surgeon: Garald Balding, MD;  Location: [facility];  Service: Orthopedics;  Laterality: Left;  . TRIGGER FINGER RELEASE     Social History   Occupational History  . Not on file  Tobacco Use  . Smoking status: Former Smoker    Packs/day: 1.00    Years: 52.50    Pack years: 52.50    Types: Cigarettes  . Smokeless tobacco: Never Used  . Tobacco  comment: Now uses e cigarettes. Encouraged to quit smoking completely.  Substance and Sexual Activity  . Alcohol use: Yes    Alcohol/week: 1.0 standard drinks    Types: 1 Standard drinks or equivalent per week    Comment: occ  . Drug use: No  . Sexual activity: Not on file

## 2018-12-11 DIAGNOSIS — H5203 Hypermetropia, bilateral: Secondary | ICD-10-CM | POA: Diagnosis not present

## 2018-12-11 DIAGNOSIS — H52223 Regular astigmatism, bilateral: Secondary | ICD-10-CM | POA: Diagnosis not present

## 2018-12-11 DIAGNOSIS — H2513 Age-related nuclear cataract, bilateral: Secondary | ICD-10-CM | POA: Diagnosis not present

## 2018-12-11 DIAGNOSIS — H524 Presbyopia: Secondary | ICD-10-CM | POA: Diagnosis not present

## 2018-12-11 DIAGNOSIS — D3132 Benign neoplasm of left choroid: Secondary | ICD-10-CM | POA: Diagnosis not present

## 2019-01-09 DIAGNOSIS — D225 Melanocytic nevi of trunk: Secondary | ICD-10-CM | POA: Diagnosis not present

## 2019-01-09 DIAGNOSIS — L281 Prurigo nodularis: Secondary | ICD-10-CM | POA: Diagnosis not present

## 2019-01-09 DIAGNOSIS — Z85828 Personal history of other malignant neoplasm of skin: Secondary | ICD-10-CM | POA: Diagnosis not present

## 2019-01-09 DIAGNOSIS — L570 Actinic keratosis: Secondary | ICD-10-CM | POA: Diagnosis not present

## 2019-01-09 DIAGNOSIS — L814 Other melanin hyperpigmentation: Secondary | ICD-10-CM | POA: Diagnosis not present

## 2019-01-09 DIAGNOSIS — L821 Other seborrheic keratosis: Secondary | ICD-10-CM | POA: Diagnosis not present

## 2019-01-30 DIAGNOSIS — Z0000 Encounter for general adult medical examination without abnormal findings: Secondary | ICD-10-CM | POA: Diagnosis not present

## 2019-01-30 DIAGNOSIS — Z23 Encounter for immunization: Secondary | ICD-10-CM | POA: Diagnosis not present

## 2019-01-30 DIAGNOSIS — I719 Aortic aneurysm of unspecified site, without rupture: Secondary | ICD-10-CM | POA: Diagnosis not present

## 2019-01-30 DIAGNOSIS — E7800 Pure hypercholesterolemia, unspecified: Secondary | ICD-10-CM | POA: Diagnosis not present

## 2019-01-30 DIAGNOSIS — F321 Major depressive disorder, single episode, moderate: Secondary | ICD-10-CM | POA: Diagnosis not present

## 2019-01-30 DIAGNOSIS — E1169 Type 2 diabetes mellitus with other specified complication: Secondary | ICD-10-CM | POA: Diagnosis not present

## 2019-02-06 DIAGNOSIS — D485 Neoplasm of uncertain behavior of skin: Secondary | ICD-10-CM | POA: Diagnosis not present

## 2019-02-06 DIAGNOSIS — R208 Other disturbances of skin sensation: Secondary | ICD-10-CM | POA: Diagnosis not present

## 2019-02-06 DIAGNOSIS — L989 Disorder of the skin and subcutaneous tissue, unspecified: Secondary | ICD-10-CM | POA: Diagnosis not present

## 2019-08-08 ENCOUNTER — Other Ambulatory Visit: Payer: Self-pay | Admitting: Family Medicine

## 2019-08-08 DIAGNOSIS — I719 Aortic aneurysm of unspecified site, without rupture: Secondary | ICD-10-CM

## 2019-08-15 ENCOUNTER — Ambulatory Visit
Admission: RE | Admit: 2019-08-15 | Discharge: 2019-08-15 | Disposition: A | Discharge status: Home / Self Care | Payer: Medicare Other | Source: Ambulatory Visit | Attending: Family Medicine | Admitting: Family Medicine

## 2019-08-15 DIAGNOSIS — I719 Aortic aneurysm of unspecified site, without rupture: Secondary | ICD-10-CM

## 2019-10-24 ENCOUNTER — Ambulatory Visit
Admission: RE | Admit: 2019-10-24 | Discharge: 2019-10-24 | Disposition: A | Discharge status: Home / Self Care | Payer: Medicare Other | Source: Ambulatory Visit | Attending: Acute Care | Admitting: Acute Care

## 2019-10-24 DIAGNOSIS — Z122 Encounter for screening for malignant neoplasm of respiratory organs: Secondary | ICD-10-CM

## 2019-10-24 DIAGNOSIS — Z87891 Personal history of nicotine dependence: Secondary | ICD-10-CM

## 2019-10-25 NOTE — Progress Notes (Signed)
Please call patient and let them  know their  low dose Ct was read as a Lung RADS 2: nodules that are benign in appearance and behavior with a very low likelihood of becoming a clinically active cancer due to size or lack of growth. Recommendation per radiology is for a repeat LDCT in 12 months. .Please let them  know we will order and schedule their  annual screening scan for 10/2020. Please let them  know there was notation of CAD on their  scan.  Please remind the patient  that this is a non-gated exam therefore degree or severity of disease  cannot be determined. Please have them  follow up with their PCP regarding potential risk factor modification, dietary therapy or pharmacologic therapy if clinically indicated. Pt.  is  currently on statin therapy. Please place order for annual  screening scan for 10/2020 and fax results to PCP. Thanks so much. 

## 2019-10-29 ENCOUNTER — Telehealth: Payer: Self-pay | Admitting: Acute Care

## 2019-10-29 DIAGNOSIS — Z87891 Personal history of nicotine dependence: Secondary | ICD-10-CM

## 2019-10-30 NOTE — Telephone Encounter (Signed)
Pt informed of CT results per Sarah Groce, NP.  PT verbalized understanding.  Copy sent to PCP.  Order placed for 1 yr f/u CT.  

## 2019-10-30 NOTE — Telephone Encounter (Signed)
Spoke with pt's wife and she asked that I call pt back after 3:30 today when pt gets home from work.

## 2020-01-03 ENCOUNTER — Other Ambulatory Visit: Payer: Self-pay

## 2020-01-03 ENCOUNTER — Ambulatory Visit (INDEPENDENT_AMBULATORY_CARE_PROVIDER_SITE_OTHER): Payer: Medicare Other | Admitting: Podiatry

## 2020-01-03 DIAGNOSIS — M79671 Pain in right foot: Secondary | ICD-10-CM

## 2020-01-03 DIAGNOSIS — L989 Disorder of the skin and subcutaneous tissue, unspecified: Secondary | ICD-10-CM | POA: Diagnosis not present

## 2020-01-03 NOTE — Patient Instructions (Signed)
Keep the bandage on for 24 hours. At that time, remove and clean with soap and water. If it hurts or burns before 24 hours go ahead and remove the bandage and wash with soap and water. Keep the area clean. If there is any blistering cover with antibiotic ointment and a bandage. Monitor for any redness, drainage, or other signs of infection. Call the office if any are to occur. If you have any questions, please call the office at [phone].  

## 2020-01-06 NOTE — Progress Notes (Signed)
Subjective:   Patient ID: Chad Flores, male   DOB: 69 y.o.   MRN: [id number]   HPI 69 year old male presents the office today for concerns of a painful skin lesion on the bottom of his right foot which he has had for several years.  Tried over-the-counter currently for past but insignificant improvement.  Denies any drainage or pus any swelling or bleeding.  He has no other concerns today.   Review of Systems  All other systems reviewed and are negative.  Past Medical History:  Diagnosis Date   Anemia    Anxiety    COPD (chronic obstructive pulmonary disease) ([facility])    Diabetes mellitus without complication ([place])    Osteoarthritis     Past Surgical History:  Procedure Laterality Date   CHOLECYSTECTOMY     ELBOW SURGERY     KNEE ARTHROPLASTY     KNEE SURGERY     TOTAL KNEE ARTHROPLASTY Left 03/22/2017   TOTAL KNEE ARTHROPLASTY Left 03/22/2017   Procedure: LEFT TOTAL KNEE ARTHROPLASTY;  Surgeon: Chad Balding, MD;  Location: [facility];  Service: Orthopedics;  Laterality: Left;   TRIGGER FINGER RELEASE       Current Outpatient Medications:    acetaminophen (TYLENOL) 325 MG tablet, Take 2 tablets (650 mg total) every 6 (six) hours as needed by mouth., Disp: , Rfl:    aspirin EC 81 MG tablet, Take 81 mg by mouth daily., Disp: , Rfl:    atorvastatin (LIPITOR) 10 MG tablet, Take 10 mg by mouth daily., Disp: , Rfl: 1   buPROPion (WELLBUTRIN XL) 150 MG 24 hr tablet, Take 150 mg by mouth in the morning, Disp: , Rfl: 1   Fluticasone-Salmeterol (ADVAIR) 250-50 MCG/DOSE AEPB, Inhale 1 puff into the lungs 2 (two) times daily., Disp: , Rfl:    methocarbamol (ROBAXIN) 500 MG tablet, Take 1 tablet (500 mg total) by mouth every 6 (six) hours as needed for muscle spasms. (Patient not taking: Reported on 06/27/2017), Disp: 40 tablet, Rfl: 0   oxyCODONE (OXY IR/ROXICODONE) 5 MG immediate release tablet, Take 1 tablet (5 mg total) by mouth every 12 (twelve) hours as needed for  moderate pain or severe pain. (Patient not taking: Reported on 06/27/2017), Disp: 30 tablet, Rfl: 0   rivaroxaban (XARELTO) 10 MG TABS tablet, Take 1 tablet (10 mg total) daily with breakfast by mouth. (Patient not taking: Reported on 04/19/2017), Disp: 10 tablet, Rfl: 0   vitamin B-12 (CYANOCOBALAMIN) 1000 MCG tablet, Take 1,000 mcg by mouth daily., Disp: , Rfl:   No Known Allergies      Objective:  Physical Exam  General: AAO x3, NAD  Dermatological: On the right foot submetatarsal 5 is an annular deep hyperkeratotic lesion.  There is no underlying ulceration drainage or foreign body. Along the plantar aspect of the foot just distal to the heel is a hyperkeratotic lesion with slight hyperpigmented changes but appears to be more verruca.  This appear to be superficial and I debrided the areas down to healthy, pink skin.  Did not appear to be going deep.  No open lesions.  Vascular: Dorsalis Pedis artery and Posterior Tibial artery pedal pulses are 2/4 bilateral with immedate capillary fill time. There is no pain with calf compression, swelling, warmth, erythema.   Neruologic: Grossly intact via light touch bilateral.   Musculoskeletal: Tenderness of the hyperkeratotic lesions but no other areas of discomfort.  Muscular strength 5/5 in all groups tested bilateral.  Gait: Unassisted, Nonantalgic.  Assessment:   69 year old male with skin lesions right foot    Plan:  -Treatment options discussed including all alternatives, risks, and complications -Etiology of symptoms were discussed -The hyperkeratotic lesion that was just distal to the heel and the plantar right foot I sharply debrided this with a #312 blade scalpel and sent this for pathology.  The specimen was given to Chad Flores, [place].  -With the lesion submetatarsal 5 sharply debrided this with a hyperkeratotic lesion.  The skin was cleaned with alcohol and a pad was placed followed by a small amount of salicylic acid and a  bandage.  Post procedure instructions discussed.

## 2020-01-15 ENCOUNTER — Telehealth: Payer: Self-pay | Admitting: Podiatrist

## 2020-01-15 NOTE — Telephone Encounter (Signed)
Patient called and left a vm stating he saw you a couple weeks ago and you said you would call in a medicine for his foot lesion (corn).  He is asking what to do.  (note didn't indicate any med to be called in)  Thanks!

## 2020-01-17 ENCOUNTER — Other Ambulatory Visit: Payer: Self-pay

## 2020-01-17 MED ORDER — NONFORMULARY OR COMPOUNDED ITEM: Status: AC

## 2020-01-17 NOTE — Telephone Encounter (Signed)
I called the patient back and went over his biopsy results as well.   I have ordered a callus cream from [place]

## 2020-05-08 ENCOUNTER — Other Ambulatory Visit: Payer: Medicare Other

## 2020-05-08 DIAGNOSIS — Z20822 Contact with and (suspected) exposure to covid-19: Secondary | ICD-10-CM

## 2020-05-08 NOTE — Addendum Note (Signed)
Addended by: Hilda Lias on: 05/08/2020 09:30 AM   Modules accepted: Orders

## 2020-05-11 LAB — NOVEL CORONAVIRUS, NAA: SARS-CoV-2, NAA: NOT DETECTED

## 2020-05-11 LAB — SARS-COV-2, NAA 2 DAY TAT

## 2020-06-23 DIAGNOSIS — Z01812 Encounter for preprocedural laboratory examination: Secondary | ICD-10-CM | POA: Diagnosis not present

## 2020-06-26 DIAGNOSIS — Z86010 Personal history of colonic polyps: Secondary | ICD-10-CM | POA: Diagnosis not present

## 2020-06-26 DIAGNOSIS — K5730 Diverticulosis of large intestine without perforation or abscess without bleeding: Secondary | ICD-10-CM | POA: Diagnosis not present

## 2020-06-26 DIAGNOSIS — K640 First degree hemorrhoids: Secondary | ICD-10-CM | POA: Diagnosis not present

## 2020-06-26 DIAGNOSIS — K621 Rectal polyp: Secondary | ICD-10-CM | POA: Diagnosis not present

## 2020-06-30 DIAGNOSIS — K621 Rectal polyp: Secondary | ICD-10-CM | POA: Diagnosis not present

## 2020-09-04 DIAGNOSIS — R109 Unspecified abdominal pain: Secondary | ICD-10-CM | POA: Diagnosis not present

## 2020-09-04 DIAGNOSIS — N200 Calculus of kidney: Secondary | ICD-10-CM | POA: Diagnosis not present

## 2020-09-09 ENCOUNTER — Other Ambulatory Visit: Payer: Self-pay | Admitting: Family Medicine

## 2020-09-09 DIAGNOSIS — R109 Unspecified abdominal pain: Secondary | ICD-10-CM

## 2020-09-24 ENCOUNTER — Ambulatory Visit
Admission: RE | Admit: 2020-09-24 | Discharge: 2020-09-24 | Disposition: A | Discharge status: Home / Self Care | Payer: Medicare Other | Source: Ambulatory Visit | Attending: Family Medicine | Admitting: Family Medicine

## 2020-09-24 ENCOUNTER — Other Ambulatory Visit: Payer: Self-pay

## 2020-09-24 DIAGNOSIS — R109 Unspecified abdominal pain: Secondary | ICD-10-CM

## 2020-09-24 DIAGNOSIS — M47816 Spondylosis without myelopathy or radiculopathy, lumbar region: Secondary | ICD-10-CM | POA: Diagnosis not present

## 2020-09-24 DIAGNOSIS — N3289 Other specified disorders of bladder: Secondary | ICD-10-CM | POA: Diagnosis not present

## 2020-09-24 DIAGNOSIS — R1031 Right lower quadrant pain: Secondary | ICD-10-CM | POA: Diagnosis not present

## 2020-09-24 DIAGNOSIS — M533 Sacrococcygeal disorders, not elsewhere classified: Secondary | ICD-10-CM | POA: Diagnosis not present

## 2020-09-24 MED ORDER — IOPAMIDOL (ISOVUE-300) INJECTION 61%
100.0000 mL | Freq: Once | INTRAVENOUS | Status: AC | PRN
  Administered 2020-09-24: 100 mL via INTRAVENOUS

## 2020-10-20 ENCOUNTER — Other Ambulatory Visit: Payer: Self-pay | Admitting: Acute Care

## 2020-10-20 DIAGNOSIS — Z87891 Personal history of nicotine dependence: Secondary | ICD-10-CM

## 2020-10-28 DIAGNOSIS — D414 Neoplasm of uncertain behavior of bladder: Secondary | ICD-10-CM | POA: Diagnosis not present

## 2020-11-04 ENCOUNTER — Other Ambulatory Visit: Payer: Self-pay | Admitting: Urology

## 2020-11-04 ENCOUNTER — Ambulatory Visit
Admission: RE | Admit: 2020-11-04 | Discharge: 2020-11-04 | Disposition: A | Discharge status: Home / Self Care | Payer: Medicare Other | Source: Ambulatory Visit | Attending: Acute Care | Admitting: Acute Care

## 2020-11-04 DIAGNOSIS — I700 Atherosclerosis of aorta: Secondary | ICD-10-CM | POA: Diagnosis not present

## 2020-11-04 DIAGNOSIS — J432 Centrilobular emphysema: Secondary | ICD-10-CM | POA: Diagnosis not present

## 2020-11-04 DIAGNOSIS — Z87891 Personal history of nicotine dependence: Secondary | ICD-10-CM | POA: Diagnosis not present

## 2020-11-06 ENCOUNTER — Encounter: Payer: Self-pay | Admitting: *Deleted

## 2020-11-06 DIAGNOSIS — Z87891 Personal history of nicotine dependence: Secondary | ICD-10-CM

## 2020-11-06 NOTE — Progress Notes (Signed)
Please call patient and let them  know their  low dose Ct was read as a Lung RADS 2: nodules that are benign in appearance and behavior with a very low likelihood of becoming a clinically active cancer due to size or lack of growth. Recommendation per radiology is for a repeat LDCT in 12 months. .Please let them  know we will order and schedule their  annual screening scan for 10/2021. Pt.  is  currently on statin therapy. Please place order for annual  screening scan for  10/2021 and fax results to PCP. Thanks so much.

## 2020-11-19 ENCOUNTER — Other Ambulatory Visit: Payer: Self-pay

## 2020-11-19 ENCOUNTER — Encounter (HOSPITAL_BASED_OUTPATIENT_CLINIC_OR_DEPARTMENT_OTHER): Payer: Self-pay | Admitting: Urology

## 2020-11-19 NOTE — Progress Notes (Signed)
Spoke w/ via phone for pre-op interview--- Pt Lab needs dos----   Istat and EKG            Lab results------ current chest CT in epic 11-04-2020 COVID test -----patient states asymptomatic no test needed Arrive at ------- 0630 on 11-26-2020 NPO after MN NO Solid Food.  Clear liquids from MN until--- 0530 Med rec completed Medications to take morning of surgery ----- Wellbutrin, Lipitor, Advair inhaler Diabetic medication ----- n/a Patient instructed no nail polish to be worn day of surgery Patient instructed to bring photo id and insurance card day of surgery Patient aware to have Driver (ride ) / caregiver for 24 hours after surgery --wife, Chad Flores Patient Special Instructions ----- n/a Pre-Op special Istructions ----- n/a Patient verbalized understanding of instructions that were given at this phone interview. Patient denies shortness of breath, chest pain, fever, cough at this phone interview.

## 2020-11-26 ENCOUNTER — Encounter (HOSPITAL_BASED_OUTPATIENT_CLINIC_OR_DEPARTMENT_OTHER): Payer: Self-pay | Admitting: Urology

## 2020-11-26 ENCOUNTER — Ambulatory Visit (HOSPITAL_BASED_OUTPATIENT_CLINIC_OR_DEPARTMENT_OTHER): Payer: Medicare Other | Admitting: Certified Registered Nurse Anesthetist

## 2020-11-26 ENCOUNTER — Encounter (HOSPITAL_BASED_OUTPATIENT_CLINIC_OR_DEPARTMENT_OTHER)
Admission: RE | Disposition: A | Discharge status: Home / Self Care | Payer: Self-pay | Source: Home / Self Care | Attending: Urology

## 2020-11-26 ENCOUNTER — Ambulatory Visit (HOSPITAL_BASED_OUTPATIENT_CLINIC_OR_DEPARTMENT_OTHER)
Admission: RE | Admit: 2020-11-26 | Discharge: 2020-11-26 | Disposition: A | Discharge status: Home / Self Care | Payer: Medicare Other | Attending: Urology | Admitting: Urology

## 2020-11-26 DIAGNOSIS — N329 Bladder disorder, unspecified: Secondary | ICD-10-CM | POA: Diagnosis present

## 2020-11-26 DIAGNOSIS — F17210 Nicotine dependence, cigarettes, uncomplicated: Secondary | ICD-10-CM | POA: Insufficient documentation

## 2020-11-26 DIAGNOSIS — M47816 Spondylosis without myelopathy or radiculopathy, lumbar region: Secondary | ICD-10-CM | POA: Insufficient documentation

## 2020-11-26 DIAGNOSIS — D090 Carcinoma in situ of bladder: Secondary | ICD-10-CM | POA: Diagnosis not present

## 2020-11-26 DIAGNOSIS — C679 Malignant neoplasm of bladder, unspecified: Secondary | ICD-10-CM | POA: Diagnosis not present

## 2020-11-26 DIAGNOSIS — F419 Anxiety disorder, unspecified: Secondary | ICD-10-CM | POA: Diagnosis not present

## 2020-11-26 DIAGNOSIS — I700 Atherosclerosis of aorta: Secondary | ICD-10-CM | POA: Diagnosis not present

## 2020-11-26 DIAGNOSIS — M5136 Other intervertebral disc degeneration, lumbar region: Secondary | ICD-10-CM | POA: Diagnosis not present

## 2020-11-26 DIAGNOSIS — K219 Gastro-esophageal reflux disease without esophagitis: Secondary | ICD-10-CM | POA: Diagnosis not present

## 2020-11-26 DIAGNOSIS — N3289 Other specified disorders of bladder: Secondary | ICD-10-CM | POA: Diagnosis not present

## 2020-11-26 DIAGNOSIS — J449 Chronic obstructive pulmonary disease, unspecified: Secondary | ICD-10-CM | POA: Diagnosis not present

## 2020-11-26 DIAGNOSIS — C672 Malignant neoplasm of lateral wall of bladder: Secondary | ICD-10-CM | POA: Diagnosis not present

## 2020-11-26 HISTORY — DX: Type 2 diabetes mellitus without complications: E11.9

## 2020-11-26 HISTORY — DX: Abdominal aortic aneurysm, without rupture, unspecified: I71.40

## 2020-11-26 HISTORY — DX: Other specified disorders of bladder: N32.89

## 2020-11-26 HISTORY — DX: Abdominal aortic aneurysm, without rupture: I71.4

## 2020-11-26 HISTORY — DX: Other nonspecific abnormal finding of lung field: R91.8

## 2020-11-26 HISTORY — PX: CYSTOSCOPY: SHX5120

## 2020-11-26 HISTORY — DX: Gastro-esophageal reflux disease without esophagitis: K21.9

## 2020-11-26 HISTORY — DX: Urgency of urination: R39.15

## 2020-11-26 LAB — POCT I-STAT, CHEM 8
BUN: 22 mg/dL (ref 8–23)
Calcium, Ion: 1.23 mmol/L (ref 1.15–1.40)
Chloride: 106 mmol/L (ref 98–111)
Creatinine, Ser: 1.1 mg/dL (ref 0.61–1.24)
Glucose, Bld: 114 mg/dL — ABNORMAL HIGH (ref 70–99)
HCT: 48 % (ref 39.0–52.0)
Hemoglobin: 16.3 g/dL (ref 13.0–17.0)
Potassium: 4.2 mmol/L (ref 3.5–5.1)
Sodium: 142 mmol/L (ref 135–145)
TCO2: 25 mmol/L (ref 22–32)

## 2020-11-26 SURGERY — CYSTOSCOPY
Anesthesia: General | Site: Bladder

## 2020-11-26 MED ORDER — GEMCITABINE CHEMO FOR BLADDER INSTILLATION 2000 MG
2000.0000 mg | Freq: Once | INTRAVENOUS | Status: AC
  Administered 2020-11-26: 2000 mg via INTRAVESICAL
  Filled 2020-11-26: qty 2000

## 2020-11-26 MED ORDER — AMISULPRIDE (ANTIEMETIC) 5 MG/2ML IV SOLN: 10.0000 mg | Freq: Once | INTRAVENOUS | Status: DC | PRN

## 2020-11-26 MED ORDER — SODIUM CHLORIDE 0.9 % IV SOLN
INTRAVENOUS | Status: AC
  Filled 2020-11-26: qty 2

## 2020-11-26 MED ORDER — OXYCODONE HCL 5 MG/5ML PO SOLN: 5.0000 mg | Freq: Once | ORAL | Status: DC | PRN

## 2020-11-26 MED ORDER — ONDANSETRON HCL 4 MG/2ML IJ SOLN
INTRAMUSCULAR | Status: AC
  Filled 2020-11-26: qty 2

## 2020-11-26 MED ORDER — ONDANSETRON HCL 4 MG/2ML IJ SOLN
INTRAMUSCULAR | Status: DC | PRN
  Administered 2020-11-26: 4 mg via INTRAVENOUS

## 2020-11-26 MED ORDER — CEPHALEXIN 500 MG PO CAPS: 500.0000 mg | ORAL_CAPSULE | Freq: Two times a day (BID) | ORAL | 0 refills | Status: AC

## 2020-11-26 MED ORDER — TAMSULOSIN HCL 0.4 MG PO CAPS: 0.4000 mg | ORAL_CAPSULE | Freq: Every day | ORAL | 0 refills | Status: AC

## 2020-11-26 MED ORDER — EPHEDRINE SULFATE-NACL 50-0.9 MG/10ML-% IV SOSY
PREFILLED_SYRINGE | INTRAVENOUS | Status: DC | PRN
  Administered 2020-11-26: 10 mg via INTRAVENOUS

## 2020-11-26 MED ORDER — SODIUM CHLORIDE 0.9 % IV SOLN
2.0000 g | Freq: Once | INTRAVENOUS | Status: AC
  Administered 2020-11-26: 2 g via INTRAVENOUS

## 2020-11-26 MED ORDER — LIDOCAINE 2% (20 MG/ML) 5 ML SYRINGE
INTRAMUSCULAR | Status: DC | PRN
  Administered 2020-11-26: 60 mg via INTRAVENOUS

## 2020-11-26 MED ORDER — ONDANSETRON HCL 4 MG/2ML IJ SOLN: 4.0000 mg | Freq: Once | INTRAMUSCULAR | Status: DC | PRN

## 2020-11-26 MED ORDER — DEXAMETHASONE SODIUM PHOSPHATE 10 MG/ML IJ SOLN
INTRAMUSCULAR | Status: AC
  Filled 2020-11-26: qty 1

## 2020-11-26 MED ORDER — OXYCODONE HCL 5 MG PO TABS: 5.0000 mg | ORAL_TABLET | Freq: Once | ORAL | Status: DC | PRN

## 2020-11-26 MED ORDER — LIDOCAINE HCL (PF) 2 % IJ SOLN
INTRAMUSCULAR | Status: AC
  Filled 2020-11-26: qty 5

## 2020-11-26 MED ORDER — FENTANYL CITRATE (PF) 100 MCG/2ML IJ SOLN
INTRAMUSCULAR | Status: AC
  Filled 2020-11-26: qty 2

## 2020-11-26 MED ORDER — BELLADONNA ALKALOIDS-OPIUM 16.2-60 MG RE SUPP
RECTAL | Status: AC
  Filled 2020-11-26: qty 1

## 2020-11-26 MED ORDER — FENTANYL CITRATE (PF) 100 MCG/2ML IJ SOLN
25.0000 ug | INTRAMUSCULAR | Status: DC | PRN
  Administered 2020-11-26: 50 ug via INTRAVENOUS

## 2020-11-26 MED ORDER — PROPOFOL 10 MG/ML IV BOLUS
INTRAVENOUS | Status: DC | PRN
  Administered 2020-11-26: 150 mg via INTRAVENOUS

## 2020-11-26 MED ORDER — BELLADONNA ALKALOIDS-OPIUM 16.2-60 MG RE SUPP
RECTAL | Status: DC | PRN
  Administered 2020-11-26: 1 via RECTAL

## 2020-11-26 MED ORDER — IOHEXOL 300 MG/ML  SOLN
INTRAMUSCULAR | Status: DC | PRN
  Administered 2020-11-26: 10 mL

## 2020-11-26 MED ORDER — ACETAMINOPHEN 500 MG PO TABS
1000.0000 mg | ORAL_TABLET | Freq: Once | ORAL | Status: AC
  Administered 2020-11-26: 1000 mg via ORAL

## 2020-11-26 MED ORDER — SODIUM CHLORIDE 0.9 % IR SOLN
Status: DC | PRN
  Administered 2020-11-26: 3000 mL

## 2020-11-26 MED ORDER — ACETAMINOPHEN 500 MG PO TABS
ORAL_TABLET | ORAL | Status: AC
  Filled 2020-11-26: qty 2

## 2020-11-26 MED ORDER — DEXAMETHASONE SODIUM PHOSPHATE 10 MG/ML IJ SOLN
INTRAMUSCULAR | Status: DC | PRN
  Administered 2020-11-26: 5 mg via INTRAVENOUS

## 2020-11-26 MED ORDER — LACTATED RINGERS IV SOLN: INTRAVENOUS | Status: DC

## 2020-11-26 MED ORDER — PROPOFOL 10 MG/ML IV BOLUS
INTRAVENOUS | Status: AC
  Filled 2020-11-26: qty 40

## 2020-11-26 MED ORDER — PHENAZOPYRIDINE HCL 200 MG PO TABS: 200.0000 mg | ORAL_TABLET | Freq: Three times a day (TID) | ORAL | 0 refills | Status: AC | PRN

## 2020-11-26 MED ORDER — FENTANYL CITRATE (PF) 100 MCG/2ML IJ SOLN
INTRAMUSCULAR | Status: DC | PRN
  Administered 2020-11-26 (×2): 25 ug via INTRAVENOUS
  Administered 2020-11-26: 50 ug via INTRAVENOUS

## 2020-11-26 MED ORDER — TRAMADOL HCL 50 MG PO TABS: 50.0000 mg | ORAL_TABLET | Freq: Four times a day (QID) | ORAL | 0 refills | Status: AC | PRN

## 2020-11-26 SURGICAL SUPPLY — 28 items
BAG DRAIN URO-CYSTO SKYTR STRL (DRAIN) ×2 IMPLANT
BAG DRN RND TRDRP ANRFLXCHMBR (UROLOGICAL SUPPLIES) ×1
BAG DRN UROCATH (DRAIN) ×1
BAG URINE DRAIN 2000ML AR STRL (UROLOGICAL SUPPLIES) ×1 IMPLANT
BAG URINE LEG 500ML (DRAIN) IMPLANT
CATH FOLEY 2WAY SLVR  5CC 18FR (CATHETERS) ×2
CATH FOLEY 2WAY SLVR 5CC 18FR (CATHETERS) IMPLANT
CATH URET 5FR 28IN OPEN ENDED (CATHETERS) ×1 IMPLANT
GLOVE SURG ENC MOIS LTX SZ7.5 (GLOVE) ×2 IMPLANT
GLOVE SURG LTX SZ6.5 (GLOVE) ×1 IMPLANT
GLOVE SURG UNDER POLY LF SZ6.5 (GLOVE) ×1 IMPLANT
GOWN STRL REUS W/TWL LRG LVL3 (GOWN DISPOSABLE) ×1 IMPLANT
GOWN STRL REUS W/TWL XL LVL3 (GOWN DISPOSABLE) ×2 IMPLANT
GUIDEWIRE ZIPWRE .038 STRAIGHT (WIRE) ×1 IMPLANT
HOLDER FOLEY CATH W/STRAP (MISCELLANEOUS) ×1 IMPLANT
IV NS IRRIG 3000ML ARTHROMATIC (IV SOLUTION) ×2 IMPLANT
KIT TURNOVER CYSTO (KITS) ×2 IMPLANT
LOOP CUT BIPOLAR 24F LRG (ELECTROSURGICAL) ×1 IMPLANT
MANIFOLD NEPTUNE II (INSTRUMENTS) ×2 IMPLANT
NS IRRIG 500ML POUR BTL (IV SOLUTION) IMPLANT
PACK CYSTO (CUSTOM PROCEDURE TRAY) ×2 IMPLANT
STENT URET 6FRX24 CONTOUR (STENTS) ×1 IMPLANT
SYR TOOMEY IRRIG 70ML (MISCELLANEOUS)
SYRINGE TOOMEY IRRIG 70ML (MISCELLANEOUS) IMPLANT
TUBE CONNECTING 12X1/4 (SUCTIONS) ×2 IMPLANT
TUBING UROLOGY SET (TUBING) ×2 IMPLANT
WATER STERILE IRR 3000ML UROMA (IV SOLUTION) IMPLANT
WATER STERILE IRR 500ML POUR (IV SOLUTION) IMPLANT

## 2020-11-26 NOTE — H&P (Signed)
Urology Preoperative H&P   Chief Complaint:  Bladder mass  History of Present Illness: Chad Flores is a 70 y.o. male who was found to have a 1.3 cm solid and enhancing bladder mass on CT from 09/26/2020 during an evaluation of right flank/right lower quadrant pain.   He is a longtime smoker and admits smoking 1 pack/day for the last 50 years. He has no family history of GU malignancies. From a urinary standpoint, he reports adequate force of stream and feels like he is emptying his bladder well. He has occasional episodes of urgency and urinary frequency every 2 hours, but is not bothered by it. Nocturia X 2-3. He denies a prior history of UTIs, dysuria or gross hematuria.   Past Medical History:  Diagnosis Date   Abdominal aortic aneurysm (AAA) ([place])    followed by pcp---  per last ultrasound in epic 08-15-2019, proximal 3.3cm   Anxiety    Bladder mass    urologist-- dr Eldridge Marcott   COPD (chronic obstructive pulmonary disease) ([place])    followed by pcp---  last exacerbation approx. 2019,  does not have rescue, controlled with daily advair   Diet-controlled diabetes mellitus ([place])    GERD (gastroesophageal reflux disease)    Osteoarthritis    Pulmonary nodules    followed by pcp---- last chest CT in epic 11-04-2020 bilateral tiny nodules stable   Urgency of urination     Past Surgical History:  Procedure Laterality Date   CHOLECYSTECTOMY OPEN  1987   ELBOW SURGERY Right 2008   KNEE ARTHROSCOPY W/ MENISCAL REPAIR Left    x3  last one 2012   TOTAL KNEE ARTHROPLASTY Left 03/22/2017   Procedure: LEFT TOTAL KNEE ARTHROPLASTY;  Surgeon: Garald Balding, MD;  Location: [place];  Service: Orthopedics;  Laterality: Left;   TRIGGER FINGER RELEASE Right 2007    Allergies: No Known Allergies  Family History  Problem Relation Age of Onset   Alzheimer's disease Mother    Cancer Father     Social History:  reports that he has been smoking cigarettes. He has a 25.00 pack-year smoking  history. He has never used smokeless tobacco. He reports current alcohol use of about 1.0 standard drink of alcohol per week. He reports that he does not use drugs.  ROS: A complete review of systems was performed.  All systems are negative except for pertinent findings as noted.  Physical Exam:  Vital signs in last 24 hours: Temp:  [97.7 F (36.5 C)] 97.7 F (36.5 C) (07/20 0654) Pulse Rate:  [67] 67 (07/20 0654) Resp:  [15] 15 (07/20 0654) BP: (124)/(63) 124/63 (07/20 0654) SpO2:  [96 %] 96 % (07/20 0654) Weight:  [89.1 kg] 89.1 kg (07/20 0654) Constitutional:  Alert and oriented, No acute distress Cardiovascular: Regular rate and rhythm, No JVD Respiratory: Normal respiratory effort, Lungs clear bilaterally GI: Abdomen is soft, nontender, nondistended, no abdominal masses GU: No CVA tenderness Lymphatic: No lymphadenopathy Neurologic: Grossly intact, no focal deficits Psychiatric: Normal mood and affect  Laboratory Data:  Recent Labs    11/26/20 0708  HGB 16.3  HCT 48.0    Recent Labs    11/26/20 0708  NA 142  K 4.2  CL 106  GLUCOSE 114*  BUN 22  CREATININE 1.10     Results for orders placed or performed during the hospital encounter of 11/26/20 (from the past 24 hour(s))  I-STAT, chem 8     Status: Abnormal   Collection Time: 11/26/20  7:08  AM  Result Value Ref Range   Sodium 142 135 - 145 mmol/L   Potassium 4.2 3.5 - 5.1 mmol/L   Chloride 106 98 - 111 mmol/L   BUN 22 8 - 23 mg/dL   Creatinine, Ser 1.10 0.61 - 1.24 mg/dL   Glucose, Bld 114 (H) 70 - 99 mg/dL   Calcium, Ion 1.23 1.15 - 1.40 mmol/L   TCO2 25 22 - 32 mmol/L   Hemoglobin 16.3 13.0 - 17.0 g/dL   HCT 48.0 39.0 - 52.0 %   No results found for this or any previous visit (from the past 240 hour(s)).  Renal Function: Recent Labs    11/26/20 0708  CREATININE 1.10   Estimated Creatinine Clearance: 70.2 mL/min (by C-G formula based on SCr of 1.1 mg/dL).  Radiologic Imaging: CLINICAL DATA:  Right flank pain with right lower quadrant and back  pain for 2 months.   EXAM:  CT ABDOMEN AND PELVIS WITH CONTRAST   TECHNIQUE:  Multidetector CT imaging of the abdomen and pelvis was performed  using the standard protocol following bolus administration of  intravenous contrast.   CONTRAST: 114mL ISOVUE-300 IOPAMIDOL (ISOVUE-300) INJECTION 61%   COMPARISON: Overlapping portions of CT chest 10/24/2019; abdominal  aortic ultrasound of 08/15/2019.   FINDINGS:  Lower chest: Stable scarring in the lingula. Mild dependent  subsegmental atelectasis in both lower lobes.   Hepatobiliary: Cholecystectomy. Otherwise unremarkable.   Pancreas: Unremarkable   Spleen: Unremarkable   Adrenals/Urinary Tract: 1.2 by 1.3 by 1.4 cm right posterior urinary  bladder wall abnormal intraluminal density on image 75 series 2,  concerning for transitional cell carcinoma with blood clot or fungus  ball Bing less likely differential diagnostic considerations. This  is only imaged in the portal venous phase on the current exam and  hence I am uncertain whether the density is due to enhancement or  simply being high in density to start with. No discrete filling  defect along the renal collecting systems on the delayed images  through the kidneys. No renal parenchymal lesion is identified.  Adrenal glands normal. No urinary tract calculi observed.   Stomach/Bowel: Unremarkable   Vascular/Lymphatic: Aortoiliac atherosclerotic vascular disease. The  proximal abdominal aorta measures 2.3 cm transverse on image 23  series 2. The infrarenal abdominal aorta measures up to 2.5 cm  transverse. No portion of the visualized abdominal aorta measures 3  cm or greater in diameter.   No current pathologic adenopathy is identified.   Reproductive: Dystrophic calcification along the margins of the  transition zone of the prostate gland. The prostate measures 4.8 by  3.6 by 4.6 cm (volume = 42 cm^3).   Other: No  supplemental non-categorized findings.   Musculoskeletal: Bridging spurring of the left sacroiliac joint.  Mild lower lumbar spondylosis and degenerative disc disease.   IMPRESSION:  1. 1.3 cm right posterior urinary bladder intraluminal density  suspicious for transitional cell carcinoma. Less likely blood clot  or fungus ball. No pathologic adenopathy or findings of distant  metastatic spread. Urology referral for cystoscopy recommended.  2. Aortic Atherosclerosis (ICD10-I70.0). Prior ultrasounds have  suggested a proximal abdominal aortic aneurysm, but on today's CT  exam no aneurysmal segment of the abdominal aorta is observed.  3. Upper normal sized prostate gland.  4. Mild lower lumbar spondylosis and degenerative disc disease.   These results will be called to the ordering clinician or  representative by the Radiologist Assistant, and communication  documented in the PACS or [employer].  Electronically Signed  By: Van Clines M.D.  On: 09/26/2020 09:56  I independently reviewed the above imaging studies.  Assessment and Plan Chad Flores is a 70 y.o. male with a solid and enhancing bladder mass concerning for urothelial carcinoma  The risks, benefits and alternatives of cystoscopy with TURBT with gemcitabine instillation was discussed with the patient. The risks include, but are not limited to, bleeding, urinary tract infection, bladder perforation requiring prolonged catheterization and/or open bladder repair, ureteral obstruction, voiding dysfunction and the inherent risks of general anesthesia. The patient voices understanding and wishes to proceed.   Ellison Hughs, MD 11/26/2020, 8:15 AM  [facility] Pager: [phone]

## 2020-11-26 NOTE — Anesthesia Preprocedure Evaluation (Signed)
Anesthesia Evaluation  Patient identified by MRN, date of birth, ID band Patient awake    Reviewed: Allergy & Precautions, NPO status , Patient's Chart, lab work & pertinent test results  Airway Mallampati: II  TM Distance: >3 FB Neck ROM: Full    Dental no notable dental hx.    Pulmonary COPD, Current Smoker and Patient abstained from smoking.,    Pulmonary exam normal breath sounds clear to auscultation       Cardiovascular Exercise Tolerance: Good negative cardio ROS Normal cardiovascular exam Rhythm:Regular Rate:Normal  EKG reviewed. AAA ~3cm   Neuro/Psych Anxiety negative neurological ROS     GI/Hepatic Neg liver ROS, GERD  ,  Endo/Other  negative endocrine ROSdiabetes, Well Controlled  Renal/GU negative Renal ROS   Bladder mass    Musculoskeletal  (+) Arthritis ,   Abdominal   Peds negative pediatric ROS (+)  Hematology negative hematology ROS (+)   Anesthesia Other Findings   Reproductive/Obstetrics negative OB ROS                             Anesthesia Physical Anesthesia Plan  ASA: 2  Anesthesia Plan: General   Post-op Pain Management:    Induction: Intravenous  PONV Risk Score and Plan: 1 and Treatment may vary due to age or medical condition, Ondansetron and Dexamethasone  Airway Management Planned: LMA  Additional Equipment:   Intra-op Plan:   Post-operative Plan: Extubation in OR  Informed Consent: I have reviewed the patients History and Physical, chart, labs and discussed the procedure including the risks, benefits and alternatives for the proposed anesthesia with the patient or authorized representative who has indicated his/her understanding and acceptance.     Dental advisory given  Plan Discussed with: CRNA and Anesthesiologist  Anesthesia Plan Comments:         Anesthesia Quick Evaluation

## 2020-11-26 NOTE — Anesthesia Postprocedure Evaluation (Signed)
Anesthesia Post Note  Patient: KELDAN EPLIN  Procedure(s) Performed: CYSTOSCOPY WITH EXAM UNDER ANESTHESIA/  TRANSURETHRAL RESECTION OF BLADDER TUMOR WITH GEMCITABINE INSTILLATION IN PACU , BILATERAL RETROGRADES,  RIGHT STENT PLACEMENT (Bladder)     Patient location during evaluation: PACU Anesthesia Type: General Level of consciousness: sedated Pain management: pain level controlled Vital Signs Assessment: post-procedure vital signs reviewed and stable Respiratory status: spontaneous breathing and respiratory function stable Cardiovascular status: stable Postop Assessment: no apparent nausea or vomiting Anesthetic complications: no   No notable events documented.  Last Vitals:  Vitals:   11/26/20 1000 11/26/20 1015  BP: (!) 141/74 125/67  Pulse: (!) 49 (!) 56  Resp: 16 13  Temp:    SpO2: 97% 93%    Last Pain:  Vitals:   11/26/20 1015  TempSrc:   PainSc: 5                  Candra R Bernette Seeman

## 2020-11-26 NOTE — Transfer of Care (Signed)
Immediate Anesthesia Transfer of Care Note  Patient: Chad Flores  Procedure(s) Performed: Procedure(s) (LRB): CYSTOSCOPY WITH EXAM UNDER ANESTHESIA/  TRANSURETHRAL RESECTION OF BLADDER TUMOR WITH GEMCITABINE INSTILLATION IN PACU , BILATERAL RETROGRADES,  RIGHT STENT PLACEMENT (N/A)  Patient Location: PACU  Anesthesia Type: General  Level of Consciousness: awake, oriented, sedated and patient cooperative  Airway & Oxygen Therapy: Patient Spontanous Breathing and Patient connected to face mask oxygen  Post-op Assessment: Report given to PACU RN and Post -op Vital signs reviewed and stable  Post vital signs: Reviewed and stable  Complications: No apparent anesthesia complications  Last Vitals:  Vitals Value Taken Time  BP    Temp    Pulse 70 11/26/20 0921  Resp    SpO2 95 % 11/26/20 0921  Vitals shown include unvalidated device data.  Last Pain:  Vitals:   11/26/20 0654  TempSrc: Oral  PainSc: 0-No pain      Patients Stated Pain Goal: 6 (70/92/95 7473)  Complications: No notable events documented.

## 2020-11-26 NOTE — Op Note (Signed)
Operative Note  Preoperative diagnosis:  1.  Solid and enhancing bladder mass  Postoperative diagnosis: 1.  2 cm papillary bladder mass adjacent to the right ureteral orifice with features concerning for urothelial cell carcinoma  Procedure(s): 1.  Cystoscopy with TURBT (medium) see 3.  Bilateral retrograde pyelograms with intraoperative interpretation of fluoroscopic imaging 3.  Right ureteral stent placement 4.  Intravesical instillation of gemcitabine  Surgeon: Ellison Hughs, MD  Assistants:  None  Anesthesia:  General  Complications:  None  EBL: Less than 5 mL  Specimens: 1.  Superficial and deep margins of right periureteral bladder mass  Drains/Catheters: 1.  Right 6 French, 24 cm JJ stent without tether 2.  18 French Foley catheter with 10 mL sterile water in the balloon  Intraoperative findings:   Solid papillary bladder mass immediately adjacent to the right ureteral orifice, but not directly involving it. Solitary right collecting system with no filling defects or dilation involving the right ureter or right renal pelvis seen on retrograde pyelogram Solitary left collecting system with no filling defects or dilation involving the left ureter or left renal pelvis seen on retrograde pyelogram  Indication:  Chad Flores is a 70 y.o. male with a solid and enhancing bladder mass seen on CT abdomen/pelvis with contrast from 09/26/2020.  He has been consented for the above procedures, voices understanding and wishes to proceed.  Description of procedure:  After informed consent was obtained, the patient was brought to the operating room and general endotracheal anesthesia was administered. The patient was then placed in the dorsolithotomy position and prepped and draped in the usual sterile fashion. A timeout was performed. A 23 French rigid cystoscope was then inserted into the urethral meatus and advanced into the bladder under direct vision. A complete bladder  survey revealed a 2 cm papillary bladder tumor immediately adjacent to, but not directly involving the right ureteral orifice.  No other intravesical lesions were identified.    The rigid cystoscope was then exchanged for a 26 French resectoscope with a bipolar loop working element.  The papillary bladder tumor was then superficially resected without tissue being sent off separately for permanent section.  A deeper margin was taken down to the detrusor musculature and sent off separately for permanent section.  Due to the close proximity to the insertion of the right ureter and the resection defect, I made the decision to place a ureteral stent.  A 5 French ureteral catheter was then inserted into the right ureteral orifice and a retrograde pyelogram was obtained, with the findings listed above.  A Glidewire was then used to intubate the lumen of the ureteral catheter and was advanced up to the right renal pelvis, under fluoroscopic guidance.  The catheter was then removed, leaving the wire in place.  A 6 French, 24 cm JJ stent was then placed over the wire and into good position within the right collecting system, confirming placement via fluoroscopy.  A 5 French ureteral catheter was then inserted into the left ureteral orifice and a retrograde pyelogram was obtained, with the findings listed above.  A Glidewire was then used to intubate the lumen of the ureteral catheter and was advanced up to the left renal pelvis, under fluoroscopic guidance.  The catheter was then removed.  An 30 French Foley catheter was then placed and the balloon was inflated with 10 mL of sterile water.  The catheter was extensively hand irrigated and free of clot and/or debris.  While in the recovery room  2000 mg of gemcitabine in 50 mL of water was instilled in the bladder through the catheter and the catheter was plugged. This will remain indwelling for approximately one hour. It will then be drained from the bladder and the  catheter will be removed and the patient discharged home.   Plan: Hold intravesical gemcitabine for 1 hour and then remove Foley catheter.  Follow-up on 12/11/2020 to discuss pathology results and to remove his Foley catheter.

## 2020-11-26 NOTE — Discharge Instructions (Addendum)

## 2020-11-26 NOTE — Anesthesia Procedure Notes (Signed)
Procedure Name: LMA Insertion Date/Time: 11/26/2020 8:31 AM Performed by: Suan Halter, CRNA Pre-anesthesia Checklist: Patient identified, Emergency Drugs available, Suction available and Patient being monitored Patient Re-evaluated:Patient Re-evaluated prior to induction Oxygen Delivery Method: Circle system utilized Preoxygenation: Pre-oxygenation with 100% oxygen Induction Type: IV induction Ventilation: Mask ventilation without difficulty LMA: LMA inserted LMA Size: 4.0 Number of attempts: 1 Airway Equipment and Method: Bite block Placement Confirmation: positive ETCO2 Tube secured with: Tape Dental Injury: Teeth and Oropharynx as per pre-operative assessment

## 2020-11-27 ENCOUNTER — Encounter (HOSPITAL_BASED_OUTPATIENT_CLINIC_OR_DEPARTMENT_OTHER): Payer: Self-pay | Admitting: Urology

## 2020-11-27 LAB — SURGICAL PATHOLOGY

## 2020-12-11 DIAGNOSIS — C672 Malignant neoplasm of lateral wall of bladder: Secondary | ICD-10-CM | POA: Diagnosis not present

## 2020-12-22 DIAGNOSIS — Z20822 Contact with and (suspected) exposure to covid-19: Secondary | ICD-10-CM | POA: Diagnosis not present

## 2021-01-15 DIAGNOSIS — C672 Malignant neoplasm of lateral wall of bladder: Secondary | ICD-10-CM | POA: Diagnosis not present

## 2021-01-15 DIAGNOSIS — Z5111 Encounter for antineoplastic chemotherapy: Secondary | ICD-10-CM | POA: Diagnosis not present

## 2021-01-22 DIAGNOSIS — C672 Malignant neoplasm of lateral wall of bladder: Secondary | ICD-10-CM | POA: Diagnosis not present

## 2021-01-22 DIAGNOSIS — Z5111 Encounter for antineoplastic chemotherapy: Secondary | ICD-10-CM | POA: Diagnosis not present

## 2021-01-29 DIAGNOSIS — Z5111 Encounter for antineoplastic chemotherapy: Secondary | ICD-10-CM | POA: Diagnosis not present

## 2021-01-29 DIAGNOSIS — C672 Malignant neoplasm of lateral wall of bladder: Secondary | ICD-10-CM | POA: Diagnosis not present

## 2021-02-05 DIAGNOSIS — Z5111 Encounter for antineoplastic chemotherapy: Secondary | ICD-10-CM | POA: Diagnosis not present

## 2021-02-05 DIAGNOSIS — C672 Malignant neoplasm of lateral wall of bladder: Secondary | ICD-10-CM | POA: Diagnosis not present

## 2021-02-12 DIAGNOSIS — Z5111 Encounter for antineoplastic chemotherapy: Secondary | ICD-10-CM | POA: Diagnosis not present

## 2021-02-12 DIAGNOSIS — C672 Malignant neoplasm of lateral wall of bladder: Secondary | ICD-10-CM | POA: Diagnosis not present

## 2021-02-19 DIAGNOSIS — C672 Malignant neoplasm of lateral wall of bladder: Secondary | ICD-10-CM | POA: Diagnosis not present

## 2021-02-19 DIAGNOSIS — Z5111 Encounter for antineoplastic chemotherapy: Secondary | ICD-10-CM | POA: Diagnosis not present

## 2021-03-05 DIAGNOSIS — Z8551 Personal history of malignant neoplasm of bladder: Secondary | ICD-10-CM | POA: Diagnosis not present

## 2021-03-05 DIAGNOSIS — N401 Enlarged prostate with lower urinary tract symptoms: Secondary | ICD-10-CM | POA: Diagnosis not present

## 2021-03-05 DIAGNOSIS — R3915 Urgency of urination: Secondary | ICD-10-CM | POA: Diagnosis not present

## 2021-04-01 DIAGNOSIS — Z125 Encounter for screening for malignant neoplasm of prostate: Secondary | ICD-10-CM | POA: Diagnosis not present

## 2021-04-01 DIAGNOSIS — F321 Major depressive disorder, single episode, moderate: Secondary | ICD-10-CM | POA: Diagnosis not present

## 2021-04-01 DIAGNOSIS — J439 Emphysema, unspecified: Secondary | ICD-10-CM | POA: Diagnosis not present

## 2021-04-01 DIAGNOSIS — Z23 Encounter for immunization: Secondary | ICD-10-CM | POA: Diagnosis not present

## 2021-04-01 DIAGNOSIS — C679 Malignant neoplasm of bladder, unspecified: Secondary | ICD-10-CM | POA: Diagnosis not present

## 2021-04-01 DIAGNOSIS — E7800 Pure hypercholesterolemia, unspecified: Secondary | ICD-10-CM | POA: Diagnosis not present

## 2021-04-01 DIAGNOSIS — E1169 Type 2 diabetes mellitus with other specified complication: Secondary | ICD-10-CM | POA: Diagnosis not present

## 2021-04-01 DIAGNOSIS — I719 Aortic aneurysm of unspecified site, without rupture: Secondary | ICD-10-CM | POA: Diagnosis not present

## 2021-04-01 DIAGNOSIS — Z720 Tobacco use: Secondary | ICD-10-CM | POA: Diagnosis not present

## 2021-04-01 DIAGNOSIS — I700 Atherosclerosis of aorta: Secondary | ICD-10-CM | POA: Diagnosis not present

## 2021-04-01 DIAGNOSIS — Z0000 Encounter for general adult medical examination without abnormal findings: Secondary | ICD-10-CM | POA: Diagnosis not present

## 2021-04-21 DIAGNOSIS — H2513 Age-related nuclear cataract, bilateral: Secondary | ICD-10-CM | POA: Diagnosis not present

## 2021-04-21 DIAGNOSIS — D23111 Other benign neoplasm of skin of right upper eyelid, including canthus: Secondary | ICD-10-CM | POA: Diagnosis not present

## 2021-05-14 DIAGNOSIS — Z5111 Encounter for antineoplastic chemotherapy: Secondary | ICD-10-CM | POA: Diagnosis not present

## 2021-05-14 DIAGNOSIS — C672 Malignant neoplasm of lateral wall of bladder: Secondary | ICD-10-CM | POA: Diagnosis not present

## 2021-05-21 DIAGNOSIS — C672 Malignant neoplasm of lateral wall of bladder: Secondary | ICD-10-CM | POA: Diagnosis not present

## 2021-05-21 DIAGNOSIS — Z5111 Encounter for antineoplastic chemotherapy: Secondary | ICD-10-CM | POA: Diagnosis not present

## 2021-05-28 DIAGNOSIS — C672 Malignant neoplasm of lateral wall of bladder: Secondary | ICD-10-CM | POA: Diagnosis not present

## 2021-05-28 DIAGNOSIS — Z5111 Encounter for antineoplastic chemotherapy: Secondary | ICD-10-CM | POA: Diagnosis not present

## 2021-06-02 ENCOUNTER — Other Ambulatory Visit: Payer: Self-pay

## 2021-06-02 ENCOUNTER — Ambulatory Visit (INDEPENDENT_AMBULATORY_CARE_PROVIDER_SITE_OTHER): Payer: Medicare Other | Admitting: Orthopaedic Surgery

## 2021-06-02 ENCOUNTER — Encounter: Payer: Self-pay | Admitting: Orthopaedic Surgery

## 2021-06-02 DIAGNOSIS — M7022 Olecranon bursitis, left elbow: Secondary | ICD-10-CM | POA: Diagnosis not present

## 2021-06-02 DIAGNOSIS — M25522 Pain in left elbow: Secondary | ICD-10-CM

## 2021-06-02 NOTE — Progress Notes (Addendum)
Office Visit Note   Patient: Chad Flores           Date of Birth: 1951-01-10           MRN: [id number] Visit Date: 06/02/2021              Requested by: No referring provider defined for this encounter. PCP: Darcus Austin, MD (Inactive)   Assessment & Plan: Visit Diagnoses:  1. Pain in left elbow   2. Olecranon bursitis of left elbow     Plan: Mr. Badie is noticed a swelling about the left olecranon for weeks.  No history of injury or trauma.  No history of fever or chills.  No history of gout.  Does have an olecranon bursa.  I aspirated about 12 cc of somewhat serosanguineous fluid is sent to the lab.  I injected small amount of cortisone i.e. 20 mg Depo-Medrol and will monitor the response  Follow-Up Instructions: Return if symptoms worsen or fail to improve.   Orders:  Orders Placed This Encounter  Procedures   Synovial Fluid Analysis, Complete   No orders of the defined types were placed in this encounter.     Procedures: Incision & Drainage  Date/Time: 06/02/2021 3:16 PM Performed by: Garald Balding, MD Authorized by: Garald Balding, MD   Consent:    Consent obtained:  Verbal   Consent given by:  Patient   Risks, benefits, and alternatives were discussed: yes   Location:    Type:  Bursa   Location:  Upper extremity   Upper extremity location:  Elbow   Elbow location:  L elbow Pre-procedure details:    Skin preparation:  Alcohol and povidone-iodine Anesthesia:    Anesthesia method:  Local infiltration   Local anesthetic:  Lidocaine 1% w/o epi Procedure type:    Complexity:  Simple Procedure details:    Needle aspiration: yes     Drainage:  Serosanguinous Post-procedure details:    Procedure completion:  Tolerated well, no immediate complications Comments:     10 cc of serosanguineous fluid removed from left olecranon bursa.  Sent to lab for cell, cell count, crystals and culture and sensitivity.  20 mg Depo-Medrol injected with Xylocaine  after aspiration   Clinical Data: No additional findings.   Subjective: Chief Complaint  Patient presents with   Left Elbow - Pain  Patient presents today for his left elbow.  Has noticed nonmovable painful swelling of the left elbow for "a long time".  No fever or chills.  No history of gout.  Has been treated with chemotherapy for bladder cancer which may or may not be related to the bursa.  Does take aspirin  HPI  Review of Systems   Objective: Vital Signs: There were no vitals taken for this visit.  Physical Exam Constitutional:      Appearance: He is well-developed.  Eyes:     Pupils: Pupils are equal, round, and reactive to light.  Pulmonary:     Effort: Pulmonary effort is normal.  Skin:    General: Skin is warm and dry.  Neurological:     Mental Status: He is alert and oriented to person, place, and time.  Psychiatric:        Behavior: Behavior normal.    Ortho Exam left elbow with typical left olecranon bursa measuring about 2 to 3 cm in diameter.  No increased heat.  No pain.  Full range of motion of elbow.  Fully mobile mass.  Specialty Comments:  No specialty comments available.  Imaging: No results found.   PMFS History: Patient Active Problem List   Diagnosis Date Noted   Olecranon bursitis of left elbow 06/02/2021   Primary osteoarthritis of left knee 03/22/2017   S/P total knee replacement using cement, left 03/22/2017   Past Medical History:  Diagnosis Date   Abdominal aortic aneurysm (AAA)    followed by pcp---  per last ultrasound in epic 08-15-2019, proximal 3.3cm   Anxiety    Bladder mass    urologist-- dr winter   COPD (chronic obstructive pulmonary disease) ([place])    followed by pcp---  last exacerbation approx. 2019,  does not have rescue, controlled with daily advair   Diet-controlled diabetes mellitus ([place])    GERD (gastroesophageal reflux disease)    Osteoarthritis    Pulmonary nodules    followed by pcp---- last chest CT in  epic 11-04-2020 bilateral tiny nodules stable   Urgency of urination     Family History  Problem Relation Age of Onset   Alzheimer's disease Mother    Cancer Father     Past Surgical History:  Procedure Laterality Date   CHOLECYSTECTOMY OPEN  1987   CYSTOSCOPY N/A 11/26/2020   Procedure: CYSTOSCOPY WITH EXAM UNDER ANESTHESIA/  TRANSURETHRAL RESECTION OF BLADDER TUMOR WITH GEMCITABINE INSTILLATION IN PACU , BILATERAL RETROGRADES,  RIGHT STENT PLACEMENT;  Surgeon: Ceasar Mons, MD;  Location: [facility];  Service: Urology;  Laterality: N/A;   ELBOW SURGERY Right 2008   KNEE ARTHROSCOPY W/ MENISCAL REPAIR Left    x3  last one 2012   TOTAL KNEE ARTHROPLASTY Left 03/22/2017   Procedure: LEFT TOTAL KNEE ARTHROPLASTY;  Surgeon: Garald Balding, MD;  Location: [place];  Service: Orthopedics;  Laterality: Left;   TRIGGER FINGER RELEASE Right 2007   Social History   Occupational History   Not on file  Tobacco Use   Smoking status: Every Day    Packs/day: 0.50    Years: 50.00    Pack years: 25.00    Types: Cigarettes   Smokeless tobacco: Never  Vaping Use   Vaping Use: Former   Quit date: 12/12/2018   Devices: vaped, unsure name  Substance and Sexual Activity   Alcohol use: Yes    Alcohol/week: 1.0 standard drink    Types: 1 Standard drinks or equivalent per week    Comment: occasional   Drug use: Never   Sexual activity: Not on file

## 2021-06-03 LAB — SYNOVIAL FLUID ANALYSIS, COMPLETE
Basophils, %: 0 %
Eosinophils-Synovial: 0 % (ref 0–2)
Lymphocytes-Synovial Fld: 69 % (ref 0–74)
Monocyte/Macrophage: 23 % (ref 0–69)
Neutrophil, Synovial: 8 % (ref 0–24)
Synoviocytes, %: 0 % (ref 0–15)
WBC, Synovial: 336 cells/uL — ABNORMAL HIGH (ref <150)

## 2021-07-10 DIAGNOSIS — R3915 Urgency of urination: Secondary | ICD-10-CM | POA: Diagnosis not present

## 2021-07-10 DIAGNOSIS — Z8551 Personal history of malignant neoplasm of bladder: Secondary | ICD-10-CM | POA: Diagnosis not present

## 2021-07-10 DIAGNOSIS — N401 Enlarged prostate with lower urinary tract symptoms: Secondary | ICD-10-CM | POA: Diagnosis not present

## 2021-09-16 ENCOUNTER — Encounter: Payer: Self-pay | Admitting: Orthopaedic Surgery

## 2021-09-16 ENCOUNTER — Ambulatory Visit (INDEPENDENT_AMBULATORY_CARE_PROVIDER_SITE_OTHER): Payer: Medicare Other | Admitting: Orthopaedic Surgery

## 2021-09-16 DIAGNOSIS — M7022 Olecranon bursitis, left elbow: Secondary | ICD-10-CM | POA: Diagnosis not present

## 2021-09-16 NOTE — Progress Notes (Signed)
? ?Office Visit Note ?  ?Patient: Chad Flores           ?Date of Birth: 22-Mar-1951           ?MRN: [id number] ?Visit Date: 09/16/2021 ?             ?Requested by: No referring provider defined for this encounter. ?PCP: Darcus Austin, MD (Inactive) ? ? ?Assessment & Plan: ?Visit Diagnoses:  ?1. Olecranon bursitis of left elbow   ? ? ?Plan: Recurrent left olecranon bursa.  Long discussion regarding treatment options including surgical excision.  Chad Flores would prefer to have a second aspiration.  This was performed without difficulty retrieving about 10 cc of bloody fluid.  This appears to be atraumatic bursa.  Injected with betamethasone.  We will see back as needed ? ?Follow-Up Instructions: Return if symptoms worsen or fail to improve.  ? ?Orders:  ?No orders of the defined types were placed in this encounter. ? ?No orders of the defined types were placed in this encounter. ? ? ? ? Procedures: ?Incision & Drainage ? ?Date/Time: 09/16/2021 3:40 PM ?Performed by: Garald Balding, MD ?Authorized by: Garald Balding, MD  ? ?Consent:  ?  Consent obtained:  Verbal ?  Consent given by:  Patient ?  Alternatives discussed:  Alternative treatment ?Location:  ?  Type:  [place] ?  Location:  Upper extremity ?  Upper extremity location:  Elbow ?  Elbow location:  L elbow ?Pre-procedure details:  ?  Skin preparation:  Alcohol and povidone-iodine ?Anesthesia:  ?  Anesthesia method:  Topical application and local infiltration ?  Local anesthetic:  Lidocaine 1% w/o epi ?Procedure details:  ?  Needle aspiration: yes   ?  Needle size:  18 G ?  Drainage:  Bloody ?  Drainage amount:  Moderate ?Comments:  ?   Aspirated 10 cc of bloody fluid from left olecranon bursa without complications and injected 6 mg betamethasone ? ? ?Clinical Data: ?No additional findings. ? ? ?Subjective: ?Chief Complaint  ?Patient presents with  ? Left Elbow - Pain  ?Patient presents today for recurrent left elbow swelling. He was here in January and  had his elbow aspirated and injected. He said that the swelling has come back. He only has pain if he applies pressure to the backside of his elbow. No injury. He is right hand dominant.  ? ?HPI ? ?Review of Systems ? ? ?Objective: ?Vital Signs: There were no vitals taken for this visit. ? ?Physical Exam ?Constitutional:   ?   Appearance: He is well-developed.  ?Eyes:  ?   Pupils: Pupils are equal, round, and reactive to light.  ?Pulmonary:  ?   Effort: Pulmonary effort is normal.  ?Skin: ?   General: Skin is warm and dry.  ?Neurological:  ?   Mental Status: He is alert and oriented to person, place, and time.  ?Psychiatric:     ?   Behavior: Behavior normal.  ? ? ?Ortho Exam recurrent left olecranon bursa probably 2-1/2 x 3 cm.  It was not red or hot.  No particular pain and freely mobile.  No loss of elbow motion ? ?Specialty Comments:  ?No specialty comments available. ? ?Imaging: ?No results found. ? ? ?PMFS History: ?Patient Active Problem List  ? Diagnosis Date Noted  ? Olecranon bursitis of left elbow 06/02/2021  ? Primary osteoarthritis of left knee 03/22/2017  ? S/P total knee replacement using cement, left 03/22/2017  ? ?Past Medical History:  ?  Diagnosis Date  ? Abdominal aortic aneurysm (AAA) ([place])   ? followed by pcp---  per last ultrasound in epic 08-15-2019, proximal 3.3cm  ? Anxiety   ? Bladder mass   ? urologist-- dr winter  ? COPD (chronic obstructive pulmonary disease) ([place])   ? followed by pcp---  last exacerbation approx. 2019,  does not have rescue, controlled with daily advair  ? Diet-controlled diabetes mellitus ([place])   ? GERD (gastroesophageal reflux disease)   ? Osteoarthritis   ? Pulmonary nodules   ? followed by pcp---- last chest CT in epic 11-04-2020 bilateral tiny nodules stable  ? Urgency of urination   ?  ?Family History  ?Problem Relation Age of Onset  ? Alzheimer's disease Mother   ? Cancer Father   ?  ?Past Surgical History:  ?Procedure Laterality Date  ? CHOLECYSTECTOMY OPEN  1987   ? CYSTOSCOPY N/A 11/26/2020  ? Procedure: CYSTOSCOPY WITH EXAM UNDER ANESTHESIA/  TRANSURETHRAL RESECTION OF BLADDER TUMOR WITH GEMCITABINE INSTILLATION IN PACU , BILATERAL RETROGRADES,  RIGHT STENT PLACEMENT;  Surgeon: Ceasar Mons, MD;  Location: [facility];  Service: Urology;  Laterality: N/A;  ? ELBOW SURGERY Right 2008  ? KNEE ARTHROSCOPY W/ MENISCAL REPAIR Left   ? x3  last one 2012  ? TOTAL KNEE ARTHROPLASTY Left 03/22/2017  ? Procedure: LEFT TOTAL KNEE ARTHROPLASTY;  Surgeon: Garald Balding, MD;  Location: [facility];  Service: Orthopedics;  Laterality: Left;  ? TRIGGER FINGER RELEASE Right 2007  ? ?Social History  ? ?Occupational History  ? Not on file  ?Tobacco Use  ? Smoking status: Every Day  ?  Packs/day: 0.50  ?  Years: 50.00  ?  Pack years: 25.00  ?  Types: Cigarettes  ? Smokeless tobacco: Never  ?Vaping Use  ? Vaping Use: Former  ? Quit date: 12/12/2018  ? Devices: vaped, unsure name  ?Substance and Sexual Activity  ? Alcohol use: Yes  ?  Alcohol/week: 1.0 standard drink  ?  Types: 1 Standard drinks or equivalent per week  ?  Comment: occasional  ? Drug use: Never  ? Sexual activity: Not on file  ? ? ? ? ? ? ?

## 2021-09-30 DIAGNOSIS — F321 Major depressive disorder, single episode, moderate: Secondary | ICD-10-CM | POA: Diagnosis not present

## 2021-09-30 DIAGNOSIS — E1169 Type 2 diabetes mellitus with other specified complication: Secondary | ICD-10-CM | POA: Diagnosis not present

## 2021-09-30 DIAGNOSIS — Z125 Encounter for screening for malignant neoplasm of prostate: Secondary | ICD-10-CM | POA: Diagnosis not present

## 2021-09-30 DIAGNOSIS — I700 Atherosclerosis of aorta: Secondary | ICD-10-CM | POA: Diagnosis not present

## 2021-09-30 DIAGNOSIS — C679 Malignant neoplasm of bladder, unspecified: Secondary | ICD-10-CM | POA: Diagnosis not present

## 2021-09-30 DIAGNOSIS — J439 Emphysema, unspecified: Secondary | ICD-10-CM | POA: Diagnosis not present

## 2021-09-30 DIAGNOSIS — R945 Abnormal results of liver function studies: Secondary | ICD-10-CM | POA: Diagnosis not present

## 2021-09-30 DIAGNOSIS — I719 Aortic aneurysm of unspecified site, without rupture: Secondary | ICD-10-CM | POA: Diagnosis not present

## 2021-09-30 DIAGNOSIS — E7800 Pure hypercholesterolemia, unspecified: Secondary | ICD-10-CM | POA: Diagnosis not present

## 2021-10-15 DIAGNOSIS — C672 Malignant neoplasm of lateral wall of bladder: Secondary | ICD-10-CM | POA: Diagnosis not present

## 2021-10-15 DIAGNOSIS — Z5111 Encounter for antineoplastic chemotherapy: Secondary | ICD-10-CM | POA: Diagnosis not present

## 2021-10-22 DIAGNOSIS — Z5111 Encounter for antineoplastic chemotherapy: Secondary | ICD-10-CM | POA: Diagnosis not present

## 2021-10-22 DIAGNOSIS — C672 Malignant neoplasm of lateral wall of bladder: Secondary | ICD-10-CM | POA: Diagnosis not present

## 2021-10-29 DIAGNOSIS — R1084 Generalized abdominal pain: Secondary | ICD-10-CM | POA: Diagnosis not present

## 2021-10-29 DIAGNOSIS — C672 Malignant neoplasm of lateral wall of bladder: Secondary | ICD-10-CM | POA: Diagnosis not present

## 2021-11-03 DIAGNOSIS — Z5111 Encounter for antineoplastic chemotherapy: Secondary | ICD-10-CM | POA: Diagnosis not present

## 2021-11-03 DIAGNOSIS — C672 Malignant neoplasm of lateral wall of bladder: Secondary | ICD-10-CM | POA: Diagnosis not present

## 2021-11-04 ENCOUNTER — Ambulatory Visit
Admission: RE | Admit: 2021-11-04 | Discharge: 2021-11-04 | Disposition: A | Discharge status: Home / Self Care | Payer: Medicare Other | Source: Ambulatory Visit | Attending: Acute Care | Admitting: Acute Care

## 2021-11-04 DIAGNOSIS — J432 Centrilobular emphysema: Secondary | ICD-10-CM | POA: Diagnosis not present

## 2021-11-04 DIAGNOSIS — Z87891 Personal history of nicotine dependence: Secondary | ICD-10-CM

## 2021-11-04 DIAGNOSIS — Z8551 Personal history of malignant neoplasm of bladder: Secondary | ICD-10-CM | POA: Diagnosis not present

## 2021-11-04 DIAGNOSIS — K760 Fatty (change of) liver, not elsewhere classified: Secondary | ICD-10-CM | POA: Diagnosis not present

## 2021-11-06 ENCOUNTER — Other Ambulatory Visit: Payer: Self-pay

## 2021-11-06 DIAGNOSIS — Z122 Encounter for screening for malignant neoplasm of respiratory organs: Secondary | ICD-10-CM

## 2021-11-06 DIAGNOSIS — Z87891 Personal history of nicotine dependence: Secondary | ICD-10-CM

## 2021-11-19 DIAGNOSIS — Z8551 Personal history of malignant neoplasm of bladder: Secondary | ICD-10-CM | POA: Diagnosis not present

## 2021-11-25 ENCOUNTER — Encounter (HOSPITAL_COMMUNITY): Payer: Self-pay | Admitting: Emergency Medicine

## 2021-11-25 ENCOUNTER — Observation Stay (HOSPITAL_COMMUNITY)
Admission: EM | Admit: 2021-11-25 | Discharge: 2021-11-26 | Disposition: A | Discharge status: Home / Self Care | Payer: Medicare Other | Attending: Family Medicine | Admitting: Family Medicine

## 2021-11-25 ENCOUNTER — Emergency Department (HOSPITAL_COMMUNITY): Payer: Medicare Other

## 2021-11-25 ENCOUNTER — Other Ambulatory Visit: Payer: Self-pay

## 2021-11-25 DIAGNOSIS — E119 Type 2 diabetes mellitus without complications: Secondary | ICD-10-CM | POA: Insufficient documentation

## 2021-11-25 DIAGNOSIS — Z125 Encounter for screening for malignant neoplasm of prostate: Secondary | ICD-10-CM | POA: Diagnosis not present

## 2021-11-25 DIAGNOSIS — C689 Malignant neoplasm of urinary organ, unspecified: Secondary | ICD-10-CM

## 2021-11-25 DIAGNOSIS — J449 Chronic obstructive pulmonary disease, unspecified: Secondary | ICD-10-CM | POA: Insufficient documentation

## 2021-11-25 DIAGNOSIS — Z96652 Presence of left artificial knee joint: Secondary | ICD-10-CM | POA: Diagnosis not present

## 2021-11-25 DIAGNOSIS — M549 Dorsalgia, unspecified: Secondary | ICD-10-CM | POA: Diagnosis not present

## 2021-11-25 DIAGNOSIS — Z8559 Personal history of malignant neoplasm of other urinary tract organ: Secondary | ICD-10-CM | POA: Insufficient documentation

## 2021-11-25 DIAGNOSIS — Z8551 Personal history of malignant neoplasm of bladder: Secondary | ICD-10-CM | POA: Diagnosis not present

## 2021-11-25 DIAGNOSIS — W1830XA Fall on same level, unspecified, initial encounter: Secondary | ICD-10-CM | POA: Diagnosis not present

## 2021-11-25 DIAGNOSIS — M79604 Pain in right leg: Secondary | ICD-10-CM | POA: Insufficient documentation

## 2021-11-25 DIAGNOSIS — S32010A Wedge compression fracture of first lumbar vertebra, initial encounter for closed fracture: Secondary | ICD-10-CM | POA: Diagnosis not present

## 2021-11-25 DIAGNOSIS — S32049A Unspecified fracture of fourth lumbar vertebra, initial encounter for closed fracture: Secondary | ICD-10-CM | POA: Diagnosis not present

## 2021-11-25 DIAGNOSIS — F17210 Nicotine dependence, cigarettes, uncomplicated: Secondary | ICD-10-CM | POA: Diagnosis not present

## 2021-11-25 DIAGNOSIS — M545 Low back pain, unspecified: Secondary | ICD-10-CM | POA: Diagnosis not present

## 2021-11-25 DIAGNOSIS — Z79899 Other long term (current) drug therapy: Secondary | ICD-10-CM | POA: Diagnosis not present

## 2021-11-25 DIAGNOSIS — S32048A Other fracture of fourth lumbar vertebra, initial encounter for closed fracture: Secondary | ICD-10-CM | POA: Diagnosis not present

## 2021-11-25 DIAGNOSIS — Z7982 Long term (current) use of aspirin: Secondary | ICD-10-CM | POA: Diagnosis not present

## 2021-11-25 DIAGNOSIS — M8448XA Pathological fracture, other site, initial encounter for fracture: Secondary | ICD-10-CM

## 2021-11-25 DIAGNOSIS — M8008XA Age-related osteoporosis with current pathological fracture, vertebra(e), initial encounter for fracture: Secondary | ICD-10-CM | POA: Diagnosis not present

## 2021-11-25 DIAGNOSIS — E782 Mixed hyperlipidemia: Secondary | ICD-10-CM

## 2021-11-25 LAB — CBC
HCT: 48.5 % (ref 39.0–52.0)
Hemoglobin: 16.6 g/dL (ref 13.0–17.0)
MCH: 31 pg (ref 26.0–34.0)
MCHC: 34.2 g/dL (ref 30.0–36.0)
MCV: 90.5 fL (ref 80.0–100.0)
Platelets: 180 10*3/uL (ref 150–400)
RBC: 5.36 MIL/uL (ref 4.22–5.81)
RDW: 12.3 % (ref 11.5–15.5)
WBC: 7.9 10*3/uL (ref 4.0–10.5)
nRBC: 0 % (ref 0.0–0.2)

## 2021-11-25 LAB — CREATININE, SERUM
Creatinine, Ser: 0.89 mg/dL (ref 0.61–1.24)
GFR, Estimated: >60 mL/min (ref >60)

## 2021-11-25 LAB — CBC WITH DIFFERENTIAL/PLATELET
Abs Immature Granulocytes: 0.03 10*3/uL (ref 0.00–0.07)
Basophils Absolute: 0 10*3/uL (ref 0.0–0.1)
Basophils Relative: 1 %
Eosinophils Absolute: 0.2 10*3/uL (ref 0.0–0.5)
Eosinophils Relative: 3 %
HCT: 46.3 % (ref 39.0–52.0)
Hemoglobin: 15.6 g/dL (ref 13.0–17.0)
Immature Granulocytes: 0 %
Lymphocytes Relative: 27 %
Lymphs Abs: 1.8 10*3/uL (ref 0.7–4.0)
MCH: 30.5 pg (ref 26.0–34.0)
MCHC: 33.7 g/dL (ref 30.0–36.0)
MCV: 90.6 fL (ref 80.0–100.0)
Monocytes Absolute: 0.4 10*3/uL (ref 0.1–1.0)
Monocytes Relative: 6 %
Neutro Abs: 4.3 10*3/uL (ref 1.7–7.7)
Neutrophils Relative %: 63 %
Platelets: 178 10*3/uL (ref 150–400)
RBC: 5.11 MIL/uL (ref 4.22–5.81)
RDW: 12.2 % (ref 11.5–15.5)
WBC: 6.8 10*3/uL (ref 4.0–10.5)
nRBC: 0 % (ref 0.0–0.2)

## 2021-11-25 LAB — COMPREHENSIVE METABOLIC PANEL
ALT: 22 U/L (ref 0–44)
AST: 23 U/L (ref 15–41)
Albumin: 3.6 g/dL (ref 3.5–5.0)
Alkaline Phosphatase: 125 U/L (ref 38–126)
Anion gap: 6 (ref 5–15)
BUN: 16 mg/dL (ref 8–23)
CO2: 24 mmol/L (ref 22–32)
Calcium: 8.8 mg/dL — ABNORMAL LOW (ref 8.9–10.3)
Chloride: 111 mmol/L (ref 98–111)
Creatinine, Ser: 0.89 mg/dL (ref 0.61–1.24)
GFR, Estimated: >60 mL/min (ref >60)
Glucose, Bld: 109 mg/dL — ABNORMAL HIGH (ref 70–99)
Potassium: 3.8 mmol/L (ref 3.5–5.1)
Sodium: 141 mmol/L (ref 135–145)
Total Bilirubin: 1 mg/dL (ref 0.3–1.2)
Total Protein: 6.4 g/dL — ABNORMAL LOW (ref 6.5–8.1)

## 2021-11-25 LAB — TSH: TSH: 0.472 u[IU]/mL (ref 0.350–4.500)

## 2021-11-25 MED ORDER — POLYETHYLENE GLYCOL 3350 17 G PO PACK: 17.0000 g | PACK | Freq: Every day | ORAL | Status: DC | PRN

## 2021-11-25 MED ORDER — ONDANSETRON HCL 4 MG PO TABS: 4.0000 mg | ORAL_TABLET | Freq: Four times a day (QID) | ORAL | Status: DC | PRN

## 2021-11-25 MED ORDER — MORPHINE SULFATE (PF) 4 MG/ML IV SOLN
4.0000 mg | Freq: Once | INTRAVENOUS | Status: AC
  Administered 2021-11-25: 4 mg via INTRAVENOUS
  Filled 2021-11-25: qty 1

## 2021-11-25 MED ORDER — CALCIUM CARBONATE ANTACID 500 MG PO CHEW: 1.0000 | CHEWABLE_TABLET | ORAL | Status: DC | PRN

## 2021-11-25 MED ORDER — ATORVASTATIN CALCIUM 10 MG PO TABS
10.0000 mg | ORAL_TABLET | Freq: Every day | ORAL | Status: DC
  Administered 2021-11-26: 10 mg via ORAL
  Filled 2021-11-25: qty 1

## 2021-11-25 MED ORDER — BUPROPION HCL ER (XL) 150 MG PO TB24
150.0000 mg | ORAL_TABLET | Freq: Every day | ORAL | Status: DC
  Administered 2021-11-26: 150 mg via ORAL
  Filled 2021-11-25: qty 1

## 2021-11-25 MED ORDER — HYDROMORPHONE HCL 1 MG/ML IJ SOLN
0.5000 mg | INTRAMUSCULAR | Status: DC | PRN
  Administered 2021-11-25: 0.5 mg via INTRAVENOUS
  Administered 2021-11-26: 1 mg via INTRAVENOUS
  Filled 2021-11-25 (×2): qty 1

## 2021-11-25 MED ORDER — GADOBUTROL 1 MMOL/ML IV SOLN
9.0000 mL | Freq: Once | INTRAVENOUS | Status: AC | PRN
  Administered 2021-11-25: 9 mL via INTRAVENOUS

## 2021-11-25 MED ORDER — TAMSULOSIN HCL 0.4 MG PO CAPS
0.4000 mg | ORAL_CAPSULE | Freq: Every day | ORAL | Status: DC
  Administered 2021-11-26: 0.4 mg via ORAL
  Filled 2021-11-25: qty 1

## 2021-11-25 MED ORDER — ACETAMINOPHEN 500 MG PO TABS: 500.0000 mg | ORAL_TABLET | Freq: Four times a day (QID) | ORAL | Status: DC | PRN

## 2021-11-25 MED ORDER — DEXAMETHASONE SODIUM PHOSPHATE 10 MG/ML IJ SOLN
10.0000 mg | Freq: Once | INTRAMUSCULAR | Status: AC
  Administered 2021-11-25: 10 mg via INTRAVENOUS
  Filled 2021-11-25: qty 1

## 2021-11-25 MED ORDER — SODIUM CHLORIDE 0.9 % IV SOLN: INTRAVENOUS | Status: DC

## 2021-11-25 MED ORDER — DICLOFENAC EPOLAMINE 1.3 % EX PTCH
1.0000 | MEDICATED_PATCH | Freq: Two times a day (BID) | CUTANEOUS | Status: DC
  Administered 2021-11-25 – 2021-11-26 (×3): 1 via TRANSDERMAL
  Filled 2021-11-25 (×5): qty 1

## 2021-11-25 MED ORDER — ONDANSETRON HCL 4 MG/2ML IJ SOLN: 4.0000 mg | Freq: Four times a day (QID) | INTRAMUSCULAR | Status: DC | PRN

## 2021-11-25 MED ORDER — PHENAZOPYRIDINE HCL 200 MG PO TABS: 200.0000 mg | ORAL_TABLET | Freq: Three times a day (TID) | ORAL | Status: DC | PRN

## 2021-11-25 MED ORDER — ASPIRIN 81 MG PO TBEC
81.0000 mg | DELAYED_RELEASE_TABLET | Freq: Every day | ORAL | Status: DC
  Administered 2021-11-26: 81 mg via ORAL
  Filled 2021-11-25: qty 1

## 2021-11-25 MED ORDER — ENOXAPARIN SODIUM 40 MG/0.4ML IJ SOSY
40.0000 mg | PREFILLED_SYRINGE | INTRAMUSCULAR | Status: DC
  Administered 2021-11-25: 40 mg via SUBCUTANEOUS
  Filled 2021-11-25: qty 0.4

## 2021-11-25 NOTE — Progress Notes (Signed)
Orthopedic Tech Progress Note Patient Details:  Chad Flores Dec 14, 1950 [id number]  Patient ID: [name], male   DOB: 10/06/1950, 71 y.o.   MRN: [id number]  Chad Flores 11/25/2021, 5:43 PM TLSO will be delivered in morning 7/20.

## 2021-11-25 NOTE — Consult Note (Signed)
Urology Consult   Physician requesting consult: Darliss Cheney, MD  Reason for consult: L4 pathological fracture/possible metastasis  History of Present Illness: Chad Flores is a 71 y.o. patient with a history of a Ta high-grade urothelial carcinoma of the bladder s/p TURBT in 11/2020 by Dr. Lovena Neighbours.  He had about 2 cm tumor and tumors overlying his right ureteral orifice.  The majority of the tumor was low-grade with focal areas of high-grade present about 5%.  He completed induction BCG in 02/2021 and has undergone maintenance BCG most recently in 10/2021.  He most recently underwent surveillance in the office on 11/19/2021 that demonstrated no suspicious lesions in his bladder.  He states that he has been following with his PCP annually for prostate cancer screening and reports normal PSA values annually.  He cannot recall his last PSA value and I am unable to access one.  He denies bothersome weight loss.  He denies family history of urologic malignancy.  He states he presented today while he was bending over to tie his shoes and heard a popping sensation with cute onset of back pain.  He denies bowel or bladder incontinence.  He denies any other bone pain.  He denies weakness of his lower extremities.  Past Medical History:  Diagnosis Date   Abdominal aortic aneurysm (AAA) ([place])    followed by pcp---  per last ultrasound in epic 08-15-2019, proximal 3.3cm   Anxiety    Bladder mass    urologist-- dr winter   COPD (chronic obstructive pulmonary disease) ([place])    followed by pcp---  last exacerbation approx. 2019,  does not have rescue, controlled with daily advair   Diet-controlled diabetes mellitus ([place])    GERD (gastroesophageal reflux disease)    Osteoarthritis    Pulmonary nodules    followed by pcp---- last chest CT in epic 11-04-2020 bilateral tiny nodules stable   Urgency of urination     Past Surgical History:  Procedure Laterality Date   CHOLECYSTECTOMY OPEN  1987    CYSTOSCOPY N/A 11/26/2020   Procedure: CYSTOSCOPY WITH EXAM UNDER ANESTHESIA/  TRANSURETHRAL RESECTION OF BLADDER TUMOR WITH GEMCITABINE INSTILLATION IN PACU , BILATERAL RETROGRADES,  RIGHT STENT PLACEMENT;  Surgeon: Ceasar Mons, MD;  Location: [facility];  Service: Urology;  Laterality: N/A;   ELBOW SURGERY Right 2008   KNEE ARTHROSCOPY W/ MENISCAL REPAIR Left    x3  last one 2012   TOTAL KNEE ARTHROPLASTY Left 03/22/2017   Procedure: LEFT TOTAL KNEE ARTHROPLASTY;  Surgeon: Garald Balding, MD;  Location: [facility];  Service: Orthopedics;  Laterality: Left;   TRIGGER FINGER RELEASE Right 2007    Medications:  Home meds:  No current facility-administered medications on file prior to encounter.   Current Outpatient Medications on File Prior to Encounter  Medication Sig Dispense Refill   aspirin EC 81 MG tablet Take 81 mg by mouth daily.     atorvastatin (LIPITOR) 10 MG tablet Take 10 mg by mouth daily.  1   buPROPion (WELLBUTRIN XL) 150 MG 24 hr tablet Take 150 mg by mouth daily.  1   phenazopyridine (PYRIDIUM) 200 MG tablet Take 1 tablet (200 mg total) by mouth 3 (three) times daily as needed (for pain with urination). 30 tablet 0   tamsulosin (FLOMAX) 0.4 MG CAPS capsule Take 1 capsule (0.4 mg total) by mouth daily. (Patient taking differently: Take 0.4 mg by mouth in the morning and at bedtime.) 30 capsule 0   acetaminophen (TYLENOL)  500 MG tablet Take 500 mg by mouth every 6 (six) hours as needed.     calcium carbonate (TUMS - DOSED IN MG ELEMENTAL CALCIUM) 500 MG chewable tablet Chew 1 tablet by mouth as needed for indigestion or heartburn.     Fluticasone-Salmeterol (ADVAIR) 250-50 MCG/DOSE AEPB Inhale 1 puff into the lungs 2 (two) times daily.     NONFORMULARY OR COMPOUNDED ITEM Callous/Corn Cream: Salicylic acid 51%, Urea 76% Cream Order faxed to [facility]     vitamin B-12 (CYANOCOBALAMIN) 1000 MCG tablet Take 1,000 mcg by mouth daily.        Scheduled Meds:  aspirin EC  81 mg Oral Daily   atorvastatin  10 mg Oral Daily   buPROPion  150 mg Oral Daily   diclofenac  1 patch Transdermal BID   enoxaparin (LOVENOX) injection  40 mg Subcutaneous Q24H   tamsulosin  0.4 mg Oral Daily   Continuous Infusions:  sodium chloride     PRN Meds:.acetaminophen, calcium carbonate, HYDROmorphone (DILAUDID) injection, ondansetron **OR** ondansetron (ZOFRAN) IV, phenazopyridine, polyethylene glycol  Allergies:  Allergies  Allergen Reactions   Penicillin G Itching, Swelling and Other (See Comments)    Other reaction(s): swelling of lips, hands, feet, rash, itching, weakness, dizziness,    Family History  Problem Relation Age of Onset   Alzheimer's disease Mother    Cancer Father     Social History:  reports that he has been smoking cigarettes. He has a 25.00 pack-year smoking history. He has never used smokeless tobacco. He reports current alcohol use of about 1.0 standard drink of alcohol per week. He reports that he does not use drugs.  ROS: A complete review of systems was performed.  All systems are negative except for pertinent findings as noted.  Physical Exam:  Vital signs in last 24 hours: Temp:  [97.9 F (36.6 C)-98.3 F (36.8 C)] 98.3 F (36.8 C) (07/19 1758) Pulse Rate:  [66-80] 77 (07/19 1758) Resp:  [16] 16 (07/19 1758) BP: (106-138)/(70-96) 121/83 (07/19 1758) SpO2:  [91 %-96 %] 92 % (07/19 1758) Constitutional:  Alert and oriented, No acute distress Cardiovascular: Regular rate and rhythm Respiratory: Normal respiratory effort, Lungs clear bilaterally GI: Abdomen is soft, nontender, nondistended, no abdominal masses Genitourinary: No CVAT. Normal male phallus, testes are descended bilaterally and non-tender and without masses, scrotum is normal in appearance without lesions or masses, perineum is normal on inspection. Rectal: Normal sphincter tone, no rectal masses, prostate is non tender and without  nodularity. Prostate size is estimated to be 40 cc Neurologic: Grossly intact, no focal deficits Psychiatric: Normal mood and affect  Laboratory Data:  Recent Labs    11/25/21 1204  WBC 6.8  HGB 15.6  HCT 46.3  PLT 178    Recent Labs    11/25/21 1204  NA 141  K 3.8  CL 111  GLUCOSE 109*  BUN 16  CALCIUM 8.8*  CREATININE 0.89     Results for orders placed or performed during the hospital encounter of 11/25/21 (from the past 24 hour(s))  Comprehensive metabolic panel     Status: Abnormal   Collection Time: 11/25/21 12:04 PM  Result Value Ref Range   Sodium 141 135 - 145 mmol/L   Potassium 3.8 3.5 - 5.1 mmol/L   Chloride 111 98 - 111 mmol/L   CO2 24 22 - 32 mmol/L   Glucose, Bld 109 (H) 70 - 99 mg/dL   BUN 16 8 - 23 mg/dL   Creatinine, Ser 0.89  0.61 - 1.24 mg/dL   Calcium 8.8 (L) 8.9 - 10.3 mg/dL   Total Protein 6.4 (L) 6.5 - 8.1 g/dL   Albumin 3.6 3.5 - 5.0 g/dL   AST 23 15 - 41 U/L   ALT 22 0 - 44 U/L   Alkaline Phosphatase 125 38 - 126 U/L   Total Bilirubin 1.0 0.3 - 1.2 mg/dL   GFR, Estimated >60 >60 mL/min   Anion gap 6 5 - 15  CBC with Differential     Status: None   Collection Time: 11/25/21 12:04 PM  Result Value Ref Range   WBC 6.8 4.0 - 10.5 K/uL   RBC 5.11 4.22 - 5.81 MIL/uL   Hemoglobin 15.6 13.0 - 17.0 g/dL   HCT 46.3 39.0 - 52.0 %   MCV 90.6 80.0 - 100.0 fL   MCH 30.5 26.0 - 34.0 pg   MCHC 33.7 30.0 - 36.0 g/dL   RDW 12.2 11.5 - 15.5 %   Platelets 178 150 - 400 K/uL   nRBC 0.0 0.0 - 0.2 %   Neutrophils Relative % 63 %   Neutro Abs 4.3 1.7 - 7.7 K/uL   Lymphocytes Relative 27 %   Lymphs Abs 1.8 0.7 - 4.0 K/uL   Monocytes Relative 6 %   Monocytes Absolute 0.4 0.1 - 1.0 K/uL   Eosinophils Relative 3 %   Eosinophils Absolute 0.2 0.0 - 0.5 K/uL   Basophils Relative 1 %   Basophils Absolute 0.0 0.0 - 0.1 K/uL   Immature Granulocytes 0 %   Abs Immature Granulocytes 0.03 0.00 - 0.07 K/uL   No results found for this or any previous visit  (from the past 240 hour(s)).  Renal Function: Recent Labs    11/25/21 1204  CREATININE 0.89   CrCl cannot be calculated (Unknown ideal weight.).  Radiologic Imaging: MR Lumbar Spine W Wo Contrast  Result Date: 11/25/2021 CLINICAL DATA:  Low back pain, cauda equina syndrome suspected EXAM: MRI LUMBAR SPINE WITHOUT AND WITH CONTRAST TECHNIQUE: Multiplanar and multiecho pulse sequences of the lumbar spine were obtained without and with intravenous contrast. CONTRAST:  77m GADAVIST GADOBUTROL 1 MMOL/ML IV SOLN COMPARISON:  None Available. FINDINGS: Segmentation:  Standard. Alignment:  Preserved. Vertebrae: Minimal loss of height at the inferior endplate of L4. There is abnormal marrow signal within the L4 vertebral body on the right with corresponding enhancement. No extraosseous extension. There is mild enhancement adjacent to the left L4-L5 facets and within the left L5 pedicle favored to be on a degenerative basis. Conus medullaris and cauda equina: Conus extends to the L1 level. Conus and cauda equina appear normal. No abnormal intrathecal enhancement. Paraspinal and other soft tissues: Unremarkable. Disc levels: L1-L2:  No stenosis. L2-L3:  Disc bulge.  No stenosis. L3-L4: Disc bulge. No canal or foraminal stenosis. Slight effacement of right subarticular recess. L4-L5: Disc bulge with superimposed left foraminal protrusion. Left greater than right facet arthropathy. No canal or right foraminal stenosis. Mild left foraminal stenosis. L5-S1:  Disc bulge.  No canal or foraminal stenosis. IMPRESSION: Abnormal marrow signal at L4 suspicious for metastatic disease. No extraosseous extension. Minor loss of height at the inferior endplate may be pathologic. Mild enhancement at the left L5 pedicle and adjacent to left L4-L5 facets is favored to reflect facet arthropathy. Electronically Signed   By: PMacy MisM.D.   On: 11/25/2021 16:33    I independently reviewed the above imaging  studies.  Impression/Recommendation  1.  History of high-grade TA urothelial  carcinoma of the bladder s/p TURBT in 11/2020 and BCG induction and maintenance.  Most recent cystoscopy in 11/2021 with no evidence of disease 2.  Prostate cancer screening: Reports normal PSA values obtained annually with his PCP.  DRE 40 g, no nodules.  -I reviewed MRI.  It is very unlikely that this is a metastatic site from his known urothelial carcinoma the bladder.  This was complete resected in 11/2020 and he has undergone BCG induction and maintenance therapy and most recently had cystoscopy last week with no evidence of disease. -DRE with 40 g prostate no nodules.  Will obtain PSA. -Recommend work-up of L4 lesion per primary team. -Following peripherally.  Please call with questions. -Will notify Dr. Lovena Neighbours of his admission  [facility] R. Shaynna Husby MD 11/25/2021, 6:18 PM  [facility]  Pager: [phone]   CC: Darliss Cheney, MD

## 2021-11-25 NOTE — H&P (Signed)
History and Physical    Chad Flores JOA:[id number] DOB: 01/13/1951 DOA: 11/25/2021  PCP: Darcus Austin, MD (Inactive)  Patient coming from:   I have personally briefly reviewed patient's old medical records in [place]  Chief Complaint: Back pain  HPI: Chad Flores is a 71 y.o. male with medical history significant of urothelial cancer, hyperlipidemia presented to emergency department with back pain.  Per patient, earlier today when he was at home, he was trying to bend to knot his shoes and him and his wife heard a " pop" and he started having back pain but he was able to ambulate.  He later went to work and once again had to bend for something and at that point in time, he could not get up and fell on the ground.  He did not lose consciousness or hit his head.  No other trauma involved.  He was brought into the emergency department.  No bowel or bladder incontinence or any weakness in the lower extremities.  ED Course: Upon arrival to ED, he was hemodynamically stable.  MRI lumbar spine showed possible L4 pathological fracture with possible L4 metastatic disease.  Patient was unable to get up despite of pain medications.  Hospital service were consulted to admit the patient.  ED physician has consulted urology already.  Review of Systems: As per HPI otherwise negative.    Past Medical History:  Diagnosis Date   Abdominal aortic aneurysm (AAA) ([place])    followed by pcp---  per last ultrasound in epic 08-15-2019, proximal 3.3cm   Anxiety    Bladder mass    urologist-- dr winter   COPD (chronic obstructive pulmonary disease) ([place])    followed by pcp---  last exacerbation approx. 2019,  does not have rescue, controlled with daily advair   Diet-controlled diabetes mellitus ([place])    GERD (gastroesophageal reflux disease)    Osteoarthritis    Pulmonary nodules    followed by pcp---- last chest CT in epic 11-04-2020 bilateral tiny nodules stable   Urgency of urination      Past Surgical History:  Procedure Laterality Date   CHOLECYSTECTOMY OPEN  1987   CYSTOSCOPY N/A 11/26/2020   Procedure: CYSTOSCOPY WITH EXAM UNDER ANESTHESIA/  TRANSURETHRAL RESECTION OF BLADDER TUMOR WITH GEMCITABINE INSTILLATION IN PACU , BILATERAL RETROGRADES,  RIGHT STENT PLACEMENT;  Surgeon: Ceasar Mons, MD;  [facility];  Service: Urology;  Laterality: N/A;   ELBOW SURGERY Right 2008   KNEE ARTHROSCOPY W/ MENISCAL REPAIR Left    x3  last one 2012   TOTAL KNEE ARTHROPLASTY Left 03/22/2017   Procedure: LEFT TOTAL KNEE ARTHROPLASTY;  Surgeon: Garald Balding, MD;  Location: [facility];  Service: Orthopedics;  Laterality: Left;   TRIGGER FINGER RELEASE Right 2007     reports that he has been smoking cigarettes. He has a 25.00 pack-year smoking history. He has never used smokeless tobacco. He reports current alcohol use of about 1.0 standard drink of alcohol per week. He reports that he does not use drugs.  No Known Allergies  Family History  Problem Relation Age of Onset   Alzheimer's disease Mother    Cancer Father     Prior to Admission medications   Medication Sig Start Date End Date Taking? Authorizing Provider  acetaminophen (TYLENOL) 500 MG tablet Take 500 mg by mouth every 6 (six) hours as needed.    [provider]  aspirin EC 81 MG tablet Take 81 mg by mouth  daily.    [provider]  atorvastatin (LIPITOR) 10 MG tablet Take 10 mg by mouth daily. 01/26/17   [provider]  buPROPion (WELLBUTRIN XL) 150 MG 24 hr tablet Take 150 mg by mouth daily. 01/26/17   [provider]  calcium carbonate (TUMS - DOSED IN MG ELEMENTAL CALCIUM) 500 MG chewable tablet Chew 1 tablet by mouth as needed for indigestion or heartburn.    [provider]  Fluticasone-Salmeterol (ADVAIR) 250-50 MCG/DOSE AEPB Inhale 1 puff into the lungs 2 (two) times daily.    [provider]  NONFORMULARY OR COMPOUNDED  ITEM Callous/Corn Cream: Salicylic acid 27%, Urea 25% Cream Order faxed to [facility] 01/17/20   Trula Slade, DPM  phenazopyridine (PYRIDIUM) 200 MG tablet Take 1 tablet (200 mg total) by mouth 3 (three) times daily as needed (for pain with urination). 11/26/20 11/26/21  Ceasar Mons, MD  tamsulosin (FLOMAX) 0.4 MG CAPS capsule Take 1 capsule (0.4 mg total) by mouth daily. 11/26/20   Ceasar Mons, MD  vitamin B-12 (CYANOCOBALAMIN) 1000 MCG tablet Take 1,000 mcg by mouth daily.    [provider]    Physical Exam: Vitals:   11/25/21 1245 11/25/21 1415 11/25/21 1443 11/25/21 1500  BP: 111/70 117/80 106/73 122/84  Pulse: 71 66 75 69  Resp: 16  16   Temp:   97.9 F (36.6 C)   TempSrc:   Oral   SpO2: 92% 93% 92% 91%    Constitutional: NAD, calm, comfortable Vitals:   11/25/21 1245 11/25/21 1415 11/25/21 1443 11/25/21 1500  BP: 111/70 117/80 106/73 122/84  Pulse: 71 66 75 69  Resp: 16  16   Temp:   97.9 F (36.6 C)   TempSrc:   Oral   SpO2: 92% 93% 92% 91%   Eyes: PERRL, lids and conjunctivae normal ENMT: Mucous membranes are moist. Posterior pharynx clear of any exudate or lesions.Normal dentition.  Neck: normal, supple, no masses, no thyromegaly Respiratory: clear to auscultation bilaterally, no wheezing, no crackles. Normal respiratory effort. No accessory muscle use.  Cardiovascular: Regular rate and rhythm, no murmurs / rubs / gallops. No extremity edema. 2+ pedal pulses. No carotid bruits.  Abdomen: no tenderness, no masses palpated. No hepatosplenomegaly. Bowel sounds positive.  Musculoskeletal: no clubbing / cyanosis. No joint deformity upper and lower extremities. Good ROM, no contractures. Normal muscle tone.  Skin: no rashes, lesions, ulcers. No induration Neurologic: CN 2-12 grossly intact. Sensation intact, DTR normal. Strength 5/5 in all 4.  Psychiatric: Normal judgment and insight. Alert and oriented x 3. Normal mood.     Labs on Admission: I have personally reviewed following labs and imaging studies  CBC: Recent Labs  Lab 11/25/21 1204  WBC 6.8  NEUTROABS 4.3  HGB 15.6  HCT 46.3  MCV 90.6  PLT 366   Basic Metabolic Panel: Recent Labs  Lab 11/25/21 1204  NA 141  K 3.8  CL 111  CO2 24  GLUCOSE 109*  BUN 16  CREATININE 0.89  CALCIUM 8.8*   GFR: CrCl cannot be calculated (Unknown ideal weight.). Liver Function Tests: Recent Labs  Lab 11/25/21 1204  AST 23  ALT 22  ALKPHOS 125  BILITOT 1.0  PROT 6.4*  ALBUMIN 3.6   No results for input(s): "LIPASE", "AMYLASE" in the last 168 hours. No results for input(s): "AMMONIA" in the last 168 hours. Coagulation Profile: No results for input(s): "INR", "PROTIME" in the last 168 hours. Cardiac Enzymes: No results for input(s): "  CKTOTAL", "CKMB", "CKMBINDEX", "TROPONINI" in the last 168 hours. BNP (last 3 results) No results for input(s): "PROBNP" in the last 8760 hours. HbA1C: No results for input(s): "HGBA1C" in the last 72 hours. CBG: No results for input(s): "GLUCAP" in the last 168 hours. Lipid Profile: No results for input(s): "CHOL", "HDL", "LDLCALC", "TRIG", "CHOLHDL", "LDLDIRECT" in the last 72 hours. Thyroid Function Tests: No results for input(s): "TSH", "T4TOTAL", "FREET4", "T3FREE", "THYROIDAB" in the last 72 hours. Anemia Panel: No results for input(s): "VITAMINB12", "FOLATE", "FERRITIN", "TIBC", "IRON", "RETICCTPCT" in the last 72 hours. Urine analysis:    Component Value Date/Time   COLORURINE YELLOW 03/14/2017 0829   APPEARANCEUR CLEAR 03/14/2017 0829   LABSPEC 1.021 03/14/2017 0829   PHURINE 5.0 03/14/2017 0829   GLUCOSEU NEGATIVE 03/14/2017 0829   HGBUR NEGATIVE 03/14/2017 0829   BILIRUBINUR NEGATIVE 03/14/2017 0829   KETONESUR NEGATIVE 03/14/2017 0829   PROTEINUR NEGATIVE 03/14/2017 0829   NITRITE NEGATIVE 03/14/2017 0829   LEUKOCYTESUR NEGATIVE 03/14/2017 0829    Radiological Exams on Admission: MR  Lumbar Spine W Wo Contrast  Result Date: 11/25/2021 CLINICAL DATA:  Low back pain, cauda equina syndrome suspected EXAM: MRI LUMBAR SPINE WITHOUT AND WITH CONTRAST TECHNIQUE: Multiplanar and multiecho pulse sequences of the lumbar spine were obtained without and with intravenous contrast. CONTRAST:  16m GADAVIST GADOBUTROL 1 MMOL/ML IV SOLN COMPARISON:  None Available. FINDINGS: Segmentation:  Standard. Alignment:  Preserved. Vertebrae: Minimal loss of height at the inferior endplate of L4. There is abnormal marrow signal within the L4 vertebral body on the right with corresponding enhancement. No extraosseous extension. There is mild enhancement adjacent to the left L4-L5 facets and within the left L5 pedicle favored to be on a degenerative basis. Conus medullaris and cauda equina: Conus extends to the L1 level. Conus and cauda equina appear normal. No abnormal intrathecal enhancement. Paraspinal and other soft tissues: Unremarkable. Disc levels: L1-L2:  No stenosis. L2-L3:  Disc bulge.  No stenosis. L3-L4: Disc bulge. No canal or foraminal stenosis. Slight effacement of right subarticular recess. L4-L5: Disc bulge with superimposed left foraminal protrusion. Left greater than right facet arthropathy. No canal or right foraminal stenosis. Mild left foraminal stenosis. L5-S1:  Disc bulge.  No canal or foraminal stenosis. IMPRESSION: Abnormal marrow signal at L4 suspicious for metastatic disease. No extraosseous extension. Minor loss of height at the inferior endplate may be pathologic. Mild enhancement at the left L5 pedicle and adjacent to left L4-L5 facets is favored to reflect facet arthropathy. Electronically Signed   By: PMacy MisM.D.   On: 11/25/2021 16:33    EKG: Independently reviewed.  Normal sinus rhythm with no acute ST-T wave changes.  Assessment/Plan Principal Problem:   L4 vertebral fracture (HCC) Active Problems:   Mixed hyperlipidemia   [place]   L4 pathological  fracture/possible metastasis: With minor height loss, I will order TLSO brace and consult PT OT.  As needed Dilaudid.  Mixed hyperlipidemia: Resume statin.  Urothelial cancer: Urology has been consulted.  DVT prophylaxis: enoxaparin (LOVENOX) injection 40 mg Start: 11/25/21 1715 Code Status: Full code Family Communication: Wife present at bedside.  Plan of care discussed with patient in length and he verbalized understanding and agreed with it. Disposition Plan: Potential discharge in 1 to 2 days Consults called: Urology called by ED physician.  RDarliss CheneyMD [facility]  *Please note that this is a verbal dictation therefore any spelling or grammatical errors are due to the "DWiotaOne" system interpretation.  Please  page via [place] and do not message via secure chat for urgent patient care matters. Secure chat can be used for non urgent patient care matters. 11/25/2021, 5:07 PM  To contact the attending provider between 7A-7P or the covering provider during after hours 7P-7A, please log into the web site [email or web address]

## 2021-11-25 NOTE — ED Triage Notes (Signed)
Today the patient bent over to tie his shoe and heard a pop in his back. Later, while at work, the patient bent to move a mat and fell to his knees in excruciating pain. He now is unable to sit up or move his right leg. EMS administered 100 mcg of Fentanyl.      EMS vitals: 136/84 BP 95% SPO2 on room air  83 HR

## 2021-11-25 NOTE — ED Notes (Signed)
Pt ambulated from room to restroom and back; hand held assists with walker.

## 2021-11-25 NOTE — ED Provider Notes (Signed)
[facility] DEPT Provider Note   CSN: [id number] Arrival date & time: 11/25/21  1116     History  Chief Complaint  Patient presents with   Back Pain   Leg Pain    Chad Flores is a 71 y.o. male.  HPI Patient presents with his wife who assists with the history.  He presents due to acute onset of back and leg pain.  He notes he was bending over when he heard an audible pop.  Wife notes that she heard this as well.  Since that time he has had difficulty near inability to move the right lower extremity secondary to pain.  Pain is severe, not improved with fentanyl provided by EMS providers in route. He did not completely fall, has no other complaints or concerns.  He was well prior to this event.  He has no history of back surgery.    Home Medications Prior to Admission medications   Medication Sig Start Date End Date Taking? Authorizing Provider  acetaminophen (TYLENOL) 500 MG tablet Take 500 mg by mouth every 6 (six) hours as needed.    [provider]  aspirin EC 81 MG tablet Take 81 mg by mouth daily.    [provider]  atorvastatin (LIPITOR) 10 MG tablet Take 10 mg by mouth daily. 01/26/17   [provider]  buPROPion (WELLBUTRIN XL) 150 MG 24 hr tablet Take 150 mg by mouth daily. 01/26/17   [provider]  calcium carbonate (TUMS - DOSED IN MG ELEMENTAL CALCIUM) 500 MG chewable tablet Chew 1 tablet by mouth as needed for indigestion or heartburn.    [provider]  Fluticasone-Salmeterol (ADVAIR) 250-50 MCG/DOSE AEPB Inhale 1 puff into the lungs 2 (two) times daily.    [provider]  NONFORMULARY OR COMPOUNDED ITEM Callous/Corn Cream: Salicylic acid 76%, Urea 19% Cream Order faxed to [facility] 01/17/20   Trula Slade, DPM  phenazopyridine (PYRIDIUM) 200 MG tablet Take 1 tablet (200 mg total) by mouth 3 (three) times daily as needed (for pain with urination). 11/26/20 11/26/21   Ceasar Mons, MD  tamsulosin (FLOMAX) 0.4 MG CAPS capsule Take 1 capsule (0.4 mg total) by mouth daily. 11/26/20   Ceasar Mons, MD  vitamin B-12 (CYANOCOBALAMIN) 1000 MCG tablet Take 1,000 mcg by mouth daily.    [provider]      Allergies    Patient has no known allergies.    Review of Systems   Review of Systems  All other systems reviewed and are negative.   Physical Exam Updated Vital Signs BP 122/84   Pulse 69   Temp 97.9 F (36.6 C) (Oral)   Resp 16   SpO2 91%  Physical Exam Vitals and nursing note reviewed.  Constitutional:      General: He is not in acute distress.    Appearance: He is well-developed.  HENT:     Head: Normocephalic and atraumatic.  Eyes:     Conjunctiva/sclera: Conjunctivae normal.  Cardiovascular:     Rate and Rhythm: Normal rate and regular rhythm.  Pulmonary:     Effort: Pulmonary effort is normal. No respiratory distress.     Breath sounds: No stridor.  Abdominal:     General: There is no distension.  Musculoskeletal:       Legs:  Skin:    General: Skin is warm and dry.  Neurological:     Mental Status: He is alert and oriented to person, place,  and time.     ED Results / Procedures / Treatments   Labs (all labs ordered are listed, but only abnormal results are displayed) Labs Reviewed  COMPREHENSIVE METABOLIC PANEL - Abnormal; Notable for the following components:      Result Value   Glucose, Bld 109 (*)    Calcium 8.8 (*)    Total Protein 6.4 (*)    All other components within normal limits  CBC WITH DIFFERENTIAL/PLATELET  URINALYSIS, ROUTINE W REFLEX MICROSCOPIC    EKG None  Radiology MR Lumbar Spine W Wo Contrast  Result Date: 11/25/2021 CLINICAL DATA:  Low back pain, cauda equina syndrome suspected EXAM: MRI LUMBAR SPINE WITHOUT AND WITH CONTRAST TECHNIQUE: Multiplanar and multiecho pulse sequences of the lumbar spine were obtained without and with intravenous contrast. CONTRAST:   18m GADAVIST GADOBUTROL 1 MMOL/ML IV SOLN COMPARISON:  None Available. FINDINGS: Segmentation:  Standard. Alignment:  Preserved. Vertebrae: Minimal loss of height at the inferior endplate of L4. There is abnormal marrow signal within the L4 vertebral body on the right with corresponding enhancement. No extraosseous extension. There is mild enhancement adjacent to the left L4-L5 facets and within the left L5 pedicle favored to be on a degenerative basis. Conus medullaris and cauda equina: Conus extends to the L1 level. Conus and cauda equina appear normal. No abnormal intrathecal enhancement. Paraspinal and other soft tissues: Unremarkable. Disc levels: L1-L2:  No stenosis. L2-L3:  Disc bulge.  No stenosis. L3-L4: Disc bulge. No canal or foraminal stenosis. Slight effacement of right subarticular recess. L4-L5: Disc bulge with superimposed left foraminal protrusion. Left greater than right facet arthropathy. No canal or right foraminal stenosis. Mild left foraminal stenosis. L5-S1:  Disc bulge.  No canal or foraminal stenosis. IMPRESSION: Abnormal marrow signal at L4 suspicious for metastatic disease. No extraosseous extension. Minor loss of height at the inferior endplate may be pathologic. Mild enhancement at the left L5 pedicle and adjacent to left L4-L5 facets is favored to reflect facet arthropathy. Electronically Signed   By: PMacy MisM.D.   On: 11/25/2021 16:33    Procedures Procedures    Medications Ordered in ED Medications  diclofenac (FLECTOR) 1.3 % 1 patch (1 patch Transdermal Patch Applied 11/25/21 1214)  dexamethasone (DECADRON) injection 10 mg (10 mg Intravenous Given 11/25/21 1214)  morphine (PF) 4 MG/ML injection 4 mg (4 mg Intravenous Given 11/25/21 1214)  morphine (PF) 4 MG/ML injection 4 mg (4 mg Intravenous Given 11/25/21 1526)  gadobutrol (GADAVIST) 1 MMOL/ML injection 9 mL (9 mLs Intravenous Contrast Given 11/25/21 1617)    ED Course/ Medical Decision Making/ A&P This patient  with a Hx of multiple medical issues, but no back surgery presents to the ED for concern of cute onset lower extremity dysfunction, pain, back pain, this involves an extensive number of treatment options, and is a complaint that carries with it a high risk of complications and morbidity.    The differential diagnosis includes acute disc herniation, paraspinal muscle tear, cauda equina is a consideration given the profound degree of discomfort, inability to move lower extremity   Social Determinants of Health:  Patient no limiting factors  Additional history obtained:  Additional history and/or information obtained from wife at bedside, notable for HPI In addition, the patient's recent cystoscopy procedure detailed below:   After the initial evaluation, orders, including: IV narcotics, monitoring, labs, MRI spine were initiated.   Patient placed on Cardiac and Pulse-Oximetry Monitors. The patient was maintained on a cardiac monitor.  The cardiac  monitored showed an rhythm of 75 sinus normal The patient was also maintained on pulse oximetry. The readings were typically 100% room air normal   On repeat evaluation of the patient stayed the same With multiple doses of narcotics the patient was comfortable at rest, but with bending, movement, turning was in bed to do so Lab Tests:  I personally interpreted labs.  The pertinent results include: Unremarkable labs  Imaging Studies ordered:  I independently visualized and interpreted imaging which showed L4 marrow enhancement concerning for metastatic disease I agree with the radiologist interpretation  Consultations Obtained:  I requested consultation with the urology and internal medicine,  and discussed lab and imaging findings as well as pertinent plan - they recommend: Patient's cancer care is managed by Dr. Gilford Rile our urologist.  I spoke with Dr. Abner Greenspan who will follow as a consulting team, evaluate the patient  Dispostion / Final  MDM:  After consideration of the diagnostic results and the patient's response to treatment, adult male with history of urothelial cancer presents with cute onset back pain.  Pain is mid lower back, and with his history, per differential including cauda equina, acute radiculopathy/disc issue versus progression of disease were considered.  MRI concerning for L4 signal loss consistent with metastatic disease versus other infiltrative process.  Given need for ongoing narcotic pain management patient was admitted to our hospitalist team, with our urology colleagues following as a consulting service, with recognition that this may be representative of a different malignancy given the patient's urothelial cancer was thought to be responding to initial treatment, and resection.  Final Clinical Impression(s) / ED Diagnoses Final diagnoses:  Pathological fracture of lumbar vertebra, initial encounter     Carmin Muskrat, MD 11/25/21 1650

## 2021-11-25 NOTE — Progress Notes (Signed)
Orthopedic Tech Progress Note Patient Details:  Chad Flores 11-26-1950 [id number]  Patient ID: [name], male   DOB: December 11, 1950, 71 y.o.   MRN: [id number]  [name] 11/25/2021, 5:54 PM TLSO ordered from [facility] clinic.

## 2021-11-26 DIAGNOSIS — S32049A Unspecified fracture of fourth lumbar vertebra, initial encounter for closed fracture: Secondary | ICD-10-CM | POA: Diagnosis not present

## 2021-11-26 DIAGNOSIS — S32048A Other fracture of fourth lumbar vertebra, initial encounter for closed fracture: Secondary | ICD-10-CM | POA: Diagnosis not present

## 2021-11-26 LAB — GLUCOSE, CAPILLARY: Glucose-Capillary: 195 mg/dL — ABNORMAL HIGH (ref 70–99)

## 2021-11-26 LAB — URINALYSIS, ROUTINE W REFLEX MICROSCOPIC
Bacteria, UA: NONE SEEN
Bilirubin Urine: NEGATIVE
Glucose, UA: >=500 mg/dL — AB
Hgb urine dipstick: NEGATIVE
Ketones, ur: 5 mg/dL — AB
Leukocytes,Ua: NEGATIVE
Nitrite: NEGATIVE
Protein, ur: NEGATIVE mg/dL
Specific Gravity, Urine: 1.024 (ref 1.005–1.030)
pH: 5 (ref 5.0–8.0)

## 2021-11-26 LAB — BASIC METABOLIC PANEL
Anion gap: 6 (ref 5–15)
BUN: 21 mg/dL (ref 8–23)
CO2: 24 mmol/L (ref 22–32)
Calcium: 8.8 mg/dL — ABNORMAL LOW (ref 8.9–10.3)
Chloride: 111 mmol/L (ref 98–111)
Creatinine, Ser: 1.05 mg/dL (ref 0.61–1.24)
GFR, Estimated: >60 mL/min (ref >60)
Glucose, Bld: 145 mg/dL — ABNORMAL HIGH (ref 70–99)
Potassium: 3.9 mmol/L (ref 3.5–5.1)
Sodium: 141 mmol/L (ref 135–145)

## 2021-11-26 LAB — CBC
HCT: 45.9 % (ref 39.0–52.0)
Hemoglobin: 15.3 g/dL (ref 13.0–17.0)
MCH: 31 pg (ref 26.0–34.0)
MCHC: 33.3 g/dL (ref 30.0–36.0)
MCV: 92.9 fL (ref 80.0–100.0)
Platelets: 173 10*3/uL (ref 150–400)
RBC: 4.94 MIL/uL (ref 4.22–5.81)
RDW: 12.2 % (ref 11.5–15.5)
WBC: 11.5 10*3/uL — ABNORMAL HIGH (ref 4.0–10.5)
nRBC: 0 % (ref 0.0–0.2)

## 2021-11-26 LAB — PSA: Prostatic Specific Antigen: 2.72 ng/mL (ref 0.00–4.00)

## 2021-11-26 MED ORDER — OXYCODONE-ACETAMINOPHEN 5-325 MG PO TABS: 1.0000 | ORAL_TABLET | ORAL | 0 refills | Status: DC | PRN

## 2021-11-26 NOTE — Evaluation (Signed)
Physical Therapy Evaluation Patient Details Name: Chad Flores MRN: [id number] DOB: 22-Mar-1951 Today's Date: 11/26/2021  History of Present Illness  Pt s/p L-4 vertebral fx and with hx of AAA, COPD, DM, L TKR and urothiel CA  Clinical Impression  Pt admitted as above and presenting with functional mobility limitations 2* back pain, TLSO in place, and ambulatory balance deficits.  Pt should progress well to dc home with family assist.     Recommendations for follow up therapy are one component of a multi-disciplinary discharge planning process, led by the attending physician.  Recommendations may be updated based on patient status, additional functional criteria and insurance authorization.  Follow Up Recommendations No PT follow up      Assistance Recommended at Discharge Intermittent Supervision/Assistance  Patient can return home with the following  A little help with walking and/or transfers;A little help with bathing/dressing/bathroom;Assistance with cooking/housework;Assist for transportation;Help with stairs or ramp for entrance    Equipment Recommendations None recommended by PT  Recommendations for Other Services       Functional Status Assessment Patient has had a recent decline in their functional status and demonstrates the ability to make significant improvements in function in a reasonable and predictable amount of time.     Precautions / Restrictions Precautions Precautions: Fall Required Braces or Orthoses: Spinal Brace Spinal Brace: Thoracolumbosacral orthotic Restrictions Weight Bearing Restrictions: No      Mobility  Bed Mobility Overal bed mobility: Needs Assistance Bed Mobility: Rolling, Sidelying to Sit Rolling: Min guard Sidelying to sit: Min assist       General bed mobility comments: cues for log roll technique    Transfers Overall transfer level: Needs assistance Equipment used: Rolling walker (2 wheels) Transfers: Sit to/from  Stand Sit to Stand: Min assist           General transfer comment: steady assist with cues for use of UEs to self assist    Ambulation/Gait Ambulation/Gait assistance: Min assist, Min guard Gait Distance (Feet): 175 Feet Assistive device: Rolling walker (2 wheels) Gait Pattern/deviations: Step-to pattern, Step-through pattern, Decreased step length - right, Decreased step length - left, Shuffle, Trunk flexed Gait velocity: decr     General Gait Details: cues for posture and position from [employer]            Wheelchair Mobility    Modified Rankin (Stroke Patients Only)       Balance Overall balance assessment: Needs assistance Sitting-balance support: No upper extremity supported, Feet supported Sitting balance-Leahy Scale: Fair     Standing balance support: Bilateral upper extremity supported Standing balance-Leahy Scale: Poor                               Pertinent Vitals/Pain Pain Assessment Pain Assessment: 0-10 Pain Score: 4  Pain Location: back Pain Descriptors / Indicators: Aching, Sore Pain Intervention(s): Limited activity within patient's tolerance, Monitored during session, Premedicated before session    [place] expects to be discharged to:: Private residence Living Arrangements: Spouse/significant other Available Help at Discharge: Family Type of Home: House Home Access: Stairs to enter Entrance Stairs-Rails: Right;Left;Can reach both Technical brewer of Steps: 3+1   Home Layout: One level Home Equipment: Conservation officer, nature (2 wheels)      Prior Function Prior Level of Function : Independent/Modified Independent                     Hand Dominance  Dominant Hand: Right    Extremity/Trunk Assessment   Upper Extremity Assessment Upper Extremity Assessment: Overall WFL for tasks assessed    Lower Extremity Assessment Lower Extremity Assessment: Overall WFL for tasks assessed     Cervical / Trunk Assessment Cervical / Trunk Assessment: Other exceptions (TLSO in place)  Communication   Communication: No difficulties  Cognition Arousal/Alertness: Awake/alert Behavior During Therapy: WFL for tasks assessed/performed Overall Cognitive Status: Within Functional Limits for tasks assessed                                          General Comments      Exercises     Assessment/Plan    PT Assessment Patient needs continued PT services  PT Problem List Decreased activity tolerance;Decreased balance;Decreased mobility;Decreased knowledge of use of DME;Pain       PT Treatment Interventions DME instruction;Gait training;Stair training;Functional mobility training;Therapeutic activities;Therapeutic exercise;Patient/family education    PT Goals (Current goals can be found in the Care Plan section)  Acute Rehab PT Goals Patient Stated Goal: 0918 PT Goal Formulation: With patient Time For Goal Achievement: 12/10/21 Potential to Achieve Goals: Good    Frequency Min 5X/week     Co-evaluation               AM-PAC PT "6 Clicks" Mobility  Outcome Measure Help needed turning from your back to your side while in a flat bed without using bedrails?: A Little Help needed moving from lying on your back to sitting on the side of a flat bed without using bedrails?: A Little Help needed moving to and from a bed to a chair (including a wheelchair)?: A Little Help needed standing up from a chair using your arms (e.g., wheelchair or bedside chair)?: A Little Help needed to walk in hospital room?: A Little Help needed climbing 3-5 steps with a railing? : A Little 6 Click Score: 18    End of Session Equipment Utilized During Treatment: Gait belt Activity Tolerance: Patient tolerated treatment well Patient left: in chair;with call bell/phone within reach;with chair alarm set;with family/visitor present Nurse Communication: Mobility status PT Visit  Diagnosis: Unsteadiness on feet (R26.81);Difficulty in walking, not elsewhere classified (R26.2)    Time: 5462-7035 PT Time Calculation (min) (ACUTE ONLY): 20 min   Charges:   PT Evaluation $PT Eval Low Complexity: 1 Low          [place] Pager [phone] Office [phone]   Marchella Hibbard 11/26/2021, 12:43 PM

## 2021-11-26 NOTE — Plan of Care (Signed)
?  Problem: Activity: ?Goal: Risk for activity intolerance will decrease ?Outcome: Progressing ?  ?Problem: Safety: ?Goal: Ability to remain free from injury will improve ?Outcome: Progressing ?  ?Problem: Pain Managment: ?Goal: General experience of comfort will improve ?Outcome: Progressing ?  ?

## 2021-11-26 NOTE — Progress Notes (Signed)
Physical Therapy Treatment Patient Details Name: Chad Flores MRN: [id number] DOB: Nov 24, 1950 Today's Date: 11/26/2021   History of Present Illness Pt s/p L-4 vertebral fx and with hx of AAA, COPD, DM, L TKR and urothiel CA    PT Comments    Pt continues very cooperative and progressing well with mobility - pt up to ambulate increased distance in hall, negotiated stairs and reviewed bed mobility.     Recommendations for follow up therapy are one component of a multi-disciplinary discharge planning process, led by the attending physician.  Recommendations may be updated based on patient status, additional functional criteria and insurance authorization.  Follow Up Recommendations  No PT follow up     Assistance Recommended at Discharge Intermittent Supervision/Assistance  Patient can return home with the following A little help with walking and/or transfers;A little help with bathing/dressing/bathroom;Assistance with cooking/housework;Assist for transportation;Help with stairs or ramp for entrance   Equipment Recommendations  None recommended by PT    Recommendations for Other Services       Precautions / Restrictions Precautions Precautions: Fall Required Braces or Orthoses: Spinal Brace Spinal Brace: Thoracolumbosacral orthotic Restrictions Weight Bearing Restrictions: No     Mobility  Bed Mobility Overal bed mobility: Needs Assistance Bed Mobility: Rolling, Sit to Sidelying Rolling: Supervision Sidelying to sit: Min assist     Sit to sidelying: Min assist General bed mobility comments: cues for log roll technique; min assist with LEs    Transfers Overall transfer level: Needs assistance Equipment used: Rolling walker (2 wheels) Transfers: Sit to/from Stand Sit to Stand: Min assist           General transfer comment: steady assist with cues for use of UEs to self assist    Ambulation/Gait Ambulation/Gait assistance: Min guard, Supervision Gait  Distance (Feet): 200 Feet Assistive device: Rolling walker (2 wheels) Gait Pattern/deviations: Step-to pattern, Step-through pattern, Decreased step length - right, Decreased step length - left, Shuffle, Trunk flexed Gait velocity: decr     General Gait Details: cues for posture and position from RW   Stairs Stairs: Yes Stairs assistance: Min guard Stair Management: Two rails, Step to pattern, Forwards Number of Stairs: 3 General stair comments: min cues for sequence   Wheelchair Mobility    Modified Rankin (Stroke Patients Only)       Balance Overall balance assessment: Needs assistance Sitting-balance support: No upper extremity supported, Feet supported Sitting balance-Leahy Scale: Fair     Standing balance support: Single extremity supported Standing balance-Leahy Scale: Poor                              Cognition Arousal/Alertness: Awake/alert Behavior During Therapy: WFL for tasks assessed/performed Overall Cognitive Status: Within Functional Limits for tasks assessed                                          Exercises      General Comments        Pertinent Vitals/Pain Pain Assessment Pain Assessment: 0-10 Pain Score: 4  Pain Location: back Pain Descriptors / Indicators: Aching, Sore Pain Intervention(s): Limited activity within patient's tolerance, Monitored during session    Home Living                          Prior Function  PT Goals (current goals can now be found in the care plan section) Acute Rehab PT Goals Patient Stated Goal: Regain IND PT Goal Formulation: With patient Time For Goal Achievement: 12/10/21 Potential to Achieve Goals: Good Progress towards PT goals: Progressing toward goals    Frequency    Min 5X/week      PT Plan Current plan remains appropriate    Co-evaluation              AM-PAC PT "6 Clicks" Mobility   Outcome Measure  Help needed turning from  your back to your side while in a flat bed without using bedrails?: A Little Help needed moving from lying on your back to sitting on the side of a flat bed without using bedrails?: A Little Help needed moving to and from a bed to a chair (including a wheelchair)?: A Little Help needed standing up from a chair using your arms (e.g., wheelchair or bedside chair)?: A Little Help needed to walk in hospital room?: A Little Help needed climbing 3-5 steps with a railing? : A Little 6 Click Score: 18    End of Session Equipment Utilized During Treatment: Gait belt Activity Tolerance: Patient tolerated treatment well Patient left: in bed;with call bell/phone within reach;with family/visitor present Nurse Communication: Mobility status PT Visit Diagnosis: Unsteadiness on feet (R26.81);Difficulty in walking, not elsewhere classified (R26.2)     Time: 1320-1340 PT Time Calculation (min) (ACUTE ONLY): 20 min  Charges:  $Gait Training: 8-22 mins                     [place] Pager [phone] Office [phone]    Kairi Harshbarger 11/26/2021, 4:00 PM

## 2021-11-26 NOTE — Discharge Summary (Signed)
PatientPhysician Discharge Summary  Chad Flores HMC:[id number] DOB: 26-Aug-1950 DOA: 11/25/2021  PCP: Darcus Austin, MD (Inactive)  Admit date: 11/25/2021 Discharge date: 11/26/2021    Admitted From: Home Disposition: Home  Recommendations for Outpatient Follow-up:  Follow up with PCP in 1-2 weeks Please obtain BMP/CBC in one week Follow-up with spine surgeon in 2 to 4 weeks if pain does not improve. Please follow up with your PCP on the following pending results: Unresulted Labs (From admission, onward)     Start     Ordered   12/02/21 0500  Creatinine, serum  (enoxaparin (LOVENOX)    CrCl >/= 30 ml/min)  Weekly,   R     Comments: while on enoxaparin therapy    11/25/21 [place]: None Equipment/Devices: None  Discharge Condition: Stable CODE STATUS: Full code Diet recommendation: Cardiac  Subjective: Seen and examined.  Wife at the bedside.  He worked with PT and did well while wearing TLSO brace.  He feels comfortable going home.  He has been cleared by PT.   HPI: Chad Flores is a 71 y.o. male with medical history significant of urothelial cancer, hyperlipidemia presented to emergency department with back pain.  Per patient, earlier today when he was at home, he was trying to bend to knot his shoes and him and his wife heard a " pop" and he started having back pain but he was able to ambulate.  He later went to work and once again had to bend for something and at that point in time, he could not get up and fell on the ground.  He did not lose consciousness or hit his head.  No other trauma involved.  He was brought into the emergency department.  No bowel or bladder incontinence or any weakness in the lower extremities.   ED Course: Upon arrival to ED, he was hemodynamically stable.  MRI lumbar spine showed possible L4 pathological fracture with possible L4 metastatic disease.  Patient was unable to get up despite of pain medications.  Hospital  service were consulted to admit the patient.  ED physician has consulted urology already.  Brief/Interim Summary: Patient was admitted for L4 fracture with minimal height loss.  TLSO brace was ordered, he worked with PT OT after that, did well, able to do ADLs with minimal help which his wife is able to provide, patient feels comfortable going home and thus he is going to be discharged in stable condition.  He was seen by urology and they do not think that he has any signs of metastasis at L4 level.  Discharge plan was discussed with patient and/or family member and they verbalized understanding and agreed with it.  Discharge Diagnoses:  Principal Problem:   L4 vertebral fracture (HCC) Active Problems:   Mixed hyperlipidemia   Urothelial cancer [facility])    Discharge Instructions   Allergies as of 11/26/2021       Reactions   Penicillin G Itching, Swelling, Rash, Other (See Comments)   Swelling of lips, hands, feet and weakness/dizziness,        Medication List     TAKE these medications    acetaminophen 500 MG tablet Commonly known as: TYLENOL Take 500 mg by mouth every 6 (six) hours as needed for mild pain or headache.   albuterol 108 (90 Base) MCG/ACT inhaler Commonly known as: VENTOLIN HFA Inhale 2 puffs into the lungs every 6 (six) hours  as needed for wheezing or shortness of breath.   aspirin EC 81 MG tablet Take 81 mg by mouth daily.   atorvastatin 10 MG tablet Commonly known as: LIPITOR Take 10 mg by mouth daily.   buPROPion 150 MG 24 hr tablet Commonly known as: WELLBUTRIN XL Take 150 mg by mouth daily.   calcium carbonate 500 MG chewable tablet Commonly known as: TUMS - dosed in mg elemental calcium Chew 1 tablet by mouth as needed for indigestion or heartburn.   NONFORMULARY OR COMPOUNDED ITEM Callous/Corn Cream: Salicylic acid 27%, Urea 06% Cream Order faxed to [place] changed:  how much to take how to take this when to take  this additional instructions   oxyCODONE-acetaminophen 5-325 MG tablet Commonly known as: Percocet Take 1 tablet by mouth every 4 (four) hours as needed for severe pain.   phenazopyridine 200 MG tablet Commonly known as: Pyridium Take 1 tablet (200 mg total) by mouth 3 (three) times daily as needed (for pain with urination).   tamsulosin 0.4 MG Caps capsule Commonly known as: FLOMAX Take 1 capsule (0.4 mg total) by mouth daily. What changed: when to take this   vitamin B-12 1000 MCG tablet Commonly known as: CYANOCOBALAMIN Take 1,000 mcg by mouth daily.   VITAMIN C PO Take 1 tablet by mouth daily with breakfast.   VITAMIN D3 PO Take 1 capsule by mouth daily with breakfast.   ZINC PO Take 1 tablet by mouth daily with breakfast.        Follow-up Information     Darcus Austin, MD Follow up in 1 week(s).   Specialty: Family Medicine Contact information: [address] [place] [address] [phone]                Allergies  Allergen Reactions   Penicillin G Itching, Swelling, Rash and Other (See Comments)    Swelling of lips, hands, feet and weakness/dizziness,    Consultations: Urology.   Procedures/Studies: MR Lumbar Spine W Wo Contrast  Result Date: 11/25/2021 CLINICAL DATA:  Low back pain, cauda equina syndrome suspected EXAM: MRI LUMBAR SPINE WITHOUT AND WITH CONTRAST TECHNIQUE: Multiplanar and multiecho pulse sequences of the lumbar spine were obtained without and with intravenous contrast. CONTRAST:  38m GADAVIST GADOBUTROL 1 MMOL/ML IV SOLN COMPARISON:  None Available. FINDINGS: Segmentation:  Standard. Alignment:  Preserved. Vertebrae: Minimal loss of height at the inferior endplate of L4. There is abnormal marrow signal within the L4 vertebral body on the right with corresponding enhancement. No extraosseous extension. There is mild enhancement adjacent to the left L4-L5 facets and within the left L5 pedicle favored to be on a  degenerative basis. Conus medullaris and cauda equina: Conus extends to the L1 level. Conus and cauda equina appear normal. No abnormal intrathecal enhancement. Paraspinal and other soft tissues: Unremarkable. Disc levels: L1-L2:  No stenosis. L2-L3:  Disc bulge.  No stenosis. L3-L4: Disc bulge. No canal or foraminal stenosis. Slight effacement of right subarticular recess. L4-L5: Disc bulge with superimposed left foraminal protrusion. Left greater than right facet arthropathy. No canal or right foraminal stenosis. Mild left foraminal stenosis. L5-S1:  Disc bulge.  No canal or foraminal stenosis. IMPRESSION: Abnormal marrow signal at L4 suspicious for metastatic disease. No extraosseous extension. Minor loss of height at the inferior endplate may be pathologic. Mild enhancement at the left L5 pedicle and adjacent to left L4-L5 facets is favored to reflect facet arthropathy. Electronically Signed   By: PAddison LankD.  On: 11/25/2021 16:33   CT CHEST LUNG CA SCREEN LOW DOSE W/O CM  Result Date: 11/05/2021 CLINICAL DATA:  Fifty-two pack-year smoking history/quit 7 years ago. Bladder cancer. EXAM: CT CHEST WITHOUT CONTRAST LOW-DOSE FOR LUNG CANCER SCREENING TECHNIQUE: Multidetector CT imaging of the chest was performed following the standard protocol without IV contrast. RADIATION DOSE REDUCTION: This exam was performed according to the departmental dose-optimization program which includes automated exposure control, adjustment of the mA and/or kV according to patient size and/or use of iterative reconstruction technique. COMPARISON:  11/04/2020 FINDINGS: Cardiovascular: Bovine arch. Aortic atherosclerosis. Normal heart size, without pericardial effusion. Mediastinum/Nodes: No mediastinal or definite hilar adenopathy, given limitations of unenhanced CT. Lungs/Pleura: No pleural fluid. Mild centrilobular emphysema. Pulmonary nodules of maximally volume derived equivalent diameter 5.5 mm, similar. Upper Abdomen:  Mild to moderate hepatic steatosis. Cholecystectomy. Normal imaged portions of the spleen, stomach, pancreas, adrenal glands, kidneys Musculoskeletal: No acute osseous abnormality. IMPRESSION: 1. Lung-RADS 2, benign appearance or behavior. Continue annual screening with low-dose chest CT without contrast in 12 months. 2. Hepatic steatosis. 3. Aortic Atherosclerosis (ICD10-I70.0) and Emphysema (ICD10-J43.9). Electronically Signed   By: Abigail Miyamoto M.D.   On: 11/05/2021 12:23    Discharge Exam: Vitals:   11/26/21 0207 11/26/21 0516  BP: (!) 101/54 112/70  Pulse: (!) 59 68  Resp: 16 17  Temp: (!) 97.5 F (36.4 C) 98 F (36.7 C)  SpO2: 93% 94%   Vitals:   11/25/21 1758 11/25/21 2013 11/26/21 0207 11/26/21 0516  BP: 121/83 115/71 (!) 101/54 112/70  Pulse: 77 74 (!) 59 68  Resp: '16 18 16 17  '$ Temp: 98.3 F (36.8 C) 97.7 F (36.5 C) (!) 97.5 F (36.4 C) 98 F (36.7 C)  TempSrc: Oral Oral Oral Oral  SpO2: 92% 93% 93% 94%    General: Pt is alert, awake, not in acute distress Cardiovascular: RRR, S1/S2 +, no rubs, no gallops Respiratory: CTA bilaterally, no wheezing, no rhonchi Abdominal: Soft, NT, ND, bowel sounds + Extremities: no edema, no cyanosis    The results of significant diagnostics from this hospitalization (including imaging, microbiology, ancillary and laboratory) are listed below for reference.     Microbiology: No results found for this or any previous visit (from the past 240 hour(s)).   Labs: BNP (last 3 results) No results for input(s): "BNP" in the last 8760 hours. Basic Metabolic Panel: Recent Labs  Lab 11/25/21 1204 11/25/21 1819 11/26/21 0327  NA 141  --  141  K 3.8  --  3.9  CL 111  --  111  CO2 24  --  24  GLUCOSE 109*  --  145*  BUN 16  --  21  CREATININE 0.89 0.89 1.05  CALCIUM 8.8*  --  8.8*   Liver Function Tests: Recent Labs  Lab 11/25/21 1204  AST 23  ALT 22  ALKPHOS 125  BILITOT 1.0  PROT 6.4*  ALBUMIN 3.6   No results for  input(s): "LIPASE", "AMYLASE" in the last 168 hours. No results for input(s): "AMMONIA" in the last 168 hours. CBC: Recent Labs  Lab 11/25/21 1204 11/25/21 1819 11/26/21 0327  WBC 6.8 7.9 11.5*  NEUTROABS 4.3  --   --   HGB 15.6 16.6 15.3  HCT 46.3 48.5 45.9  MCV 90.6 90.5 92.9  PLT 178 180 173   Cardiac Enzymes: No results for input(s): "CKTOTAL", "CKMB", "CKMBINDEX", "TROPONINI" in the last 168 hours. BNP: Invalid input(s): "POCBNP" CBG: Recent Labs  Lab 11/26/21 1158  GLUCAP 195*   D-Dimer No results for input(s): "DDIMER" in the last 72 hours. Hgb A1c No results for input(s): "HGBA1C" in the last 72 hours. Lipid Profile No results for input(s): "CHOL", "HDL", "LDLCALC", "TRIG", "CHOLHDL", "LDLDIRECT" in the last 72 hours. Thyroid function studies Recent Labs    11/25/21 1819  TSH 0.472   Anemia work up No results for input(s): "VITAMINB12", "FOLATE", "FERRITIN", "TIBC", "IRON", "RETICCTPCT" in the last 72 hours. Urinalysis    Component Value Date/Time   COLORURINE YELLOW 11/26/2021 [place] 11/26/2021 0531   LABSPEC 1.024 11/26/2021 0531   PHURINE 5.0 11/26/2021 0531   GLUCOSEU >=500 (A) 11/26/2021 0531   HGBUR NEGATIVE 11/26/2021 0531   BILIRUBINUR NEGATIVE 11/26/2021 0531   KETONESUR 5 (A) 11/26/2021 0531   PROTEINUR NEGATIVE 11/26/2021 0531   NITRITE NEGATIVE 11/26/2021 0531   LEUKOCYTESUR NEGATIVE 11/26/2021 0531   Sepsis Labs Recent Labs  Lab 11/25/21 1204 11/25/21 1819 11/26/21 0327  WBC 6.8 7.9 11.5*   Microbiology No results found for this or any previous visit (from the past 240 hour(s)).   Time coordinating discharge: Over 30 minutes  SIGNED:   Darliss Cheney, MD  [facility] 11/26/2021, 12:57 PM *Please note that this is a verbal dictation therefore any spelling or grammatical errors are due to the "[employer]" system interpretation. If 7PM-7AM, please contact night-coverage [email or web address]

## 2021-12-02 ENCOUNTER — Encounter: Payer: Self-pay | Admitting: Orthopaedic Surgery

## 2021-12-02 ENCOUNTER — Ambulatory Visit (INDEPENDENT_AMBULATORY_CARE_PROVIDER_SITE_OTHER): Payer: Medicare Other | Admitting: Orthopaedic Surgery

## 2021-12-02 DIAGNOSIS — M8448XA Pathological fracture, other site, initial encounter for fracture: Secondary | ICD-10-CM | POA: Diagnosis not present

## 2021-12-02 NOTE — Progress Notes (Signed)
Office Visit Note   Patient: Chad Flores           Date of Birth: 09/13/1950           MRN: [id number] Visit Date: 12/02/2021              Requested by: London Pepper, MD [address],  [place] [address] PCP: London Pepper, MD   Assessment & Plan: Visit Diagnoses:  1. Pathological fracture of lumbar vertebra, initial encounter     Plan: Mr. Merry experienced cute onset of low back pain a week ago.  He was bent over and felt a "pop" in his back.  At that point he could not extend or move.  He was taken to the hospital by ambulance.  Subsequent work-up demonstrated a compression fracture of L4 with the possibility of a pathologic fracture.  He has a history of urologic cancer.  The urologist consult  did not think this was tumor.  He was placed on oxycodone and a thoracolumbar support and notes that he is feeling considerably better.  He still has pain when he coughs.  He initially was having some pain into his right leg but that has resolved.  He does not have any change in bowel or bladder function.  There is minimal collapse of the inferior portion of the L4 vertebrae but diffuse edema or parenchymal change within the entire vertebrae.  I think it is worth having the interventional radiologist consider vertebroplasty and biopsy of the L4 vertebral body.  We will schedule this and like to see him back shortly after the procedure.  Presently comfortable and neurologically intact  Follow-Up Instructions: Return After vertebroplasty.   Orders:  No orders of the defined types were placed in this encounter.  No orders of the defined types were placed in this encounter.     Procedures: No procedures performed   Clinical Data: No additional findings.   Subjective: Chief Complaint  Patient presents with   Lower Back - Follow-up   Patient presents today for lowe back pain. He states that around 1 week ago he was bending over to put his shoes on  and his back started to have pain however not enough to keep him from work. Patient was later seen at the ED where he had MRI of his lumbar spine preformed which confirmed fractures in his back. Patient is currently wearing back brace which is states has been helping a great deal with managing his pain. Oxycodone was prescribed to the patient which he has been taking.   Review of Systems   Objective: Vital Signs: There were no vitals taken for this visit.  Physical Exam Constitutional:      Appearance: He is well-developed.  Eyes:     Pupils: Pupils are equal, round, and reactive to light.  Pulmonary:     Effort: Pulmonary effort is normal.  Skin:    General: Skin is warm and dry.  Neurological:     Mental Status: He is alert and oriented to person, place, and time.  Psychiatric:        Behavior: Behavior normal.     Ortho Exam awake alert and comfortable sitting with the thoracic go lumbar support in place.  Straight leg raise negative.  Coughing creates pain in the midportion of his low back in the area of the L4 vertebral body.  He also has percussible tenderness in the same area.  Motor exam intact.  No hip  pain.  Specialty Comments:  No specialty comments available.  Imaging: No results found.   PMFS History: Patient Active Problem List   Diagnosis Date Noted   Lumbar vertebral fracture, pathologic 12/02/2021   L4 vertebral fracture ([place]) 11/25/2021   Mixed hyperlipidemia 11/25/2021   Urothelial cancer ([place]) 11/25/2021   Olecranon bursitis of left elbow 06/02/2021   Primary osteoarthritis of left knee 03/22/2017   S/P total knee replacement using cement, left 03/22/2017   Past Medical History:  Diagnosis Date   Abdominal aortic aneurysm (AAA) ([place])    followed by pcp---  per last ultrasound in epic 08-15-2019, proximal 3.3cm   Anxiety    Bladder mass    urologist-- dr winter   COPD (chronic obstructive pulmonary disease) ([place])    followed by pcp---  last  exacerbation approx. 2019,  does not have rescue, controlled with daily advair   Diet-controlled diabetes mellitus ([place])    GERD (gastroesophageal reflux disease)    Osteoarthritis    Pulmonary nodules    followed by pcp---- last chest CT in epic 11-04-2020 bilateral tiny nodules stable   Urgency of urination     Family History  Problem Relation Age of Onset   Alzheimer's disease Mother    Cancer Father     Past Surgical History:  Procedure Laterality Date   CHOLECYSTECTOMY OPEN  1987   CYSTOSCOPY N/A 11/26/2020   Procedure: CYSTOSCOPY WITH EXAM UNDER ANESTHESIA/  TRANSURETHRAL RESECTION OF BLADDER TUMOR WITH GEMCITABINE INSTILLATION IN PACU , BILATERAL RETROGRADES,  RIGHT STENT PLACEMENT;  Surgeon: Ceasar Mons, MD;  Location: [facility];  Service: Urology;  Laterality: N/A;   ELBOW SURGERY Right 2008   KNEE ARTHROSCOPY W/ MENISCAL REPAIR Left    x3  last one 2012   TOTAL KNEE ARTHROPLASTY Left 03/22/2017   Procedure: LEFT TOTAL KNEE ARTHROPLASTY;  Surgeon: Garald Balding, MD;  Location: [facility];  Service: Orthopedics;  Laterality: Left;   TRIGGER FINGER RELEASE Right 2007   Social History   Occupational History   Not on file  Tobacco Use   Smoking status: Every Day    Packs/day: 0.50    Years: 50.00    Total pack years: 25.00    Types: Cigarettes   Smokeless tobacco: Never  Vaping Use   Vaping Use: Former   Quit date: 12/12/2018   Devices: vaped, unsure name  Substance and Sexual Activity   Alcohol use: Yes    Alcohol/week: 1.0 standard drink of alcohol    Types: 1 Standard drinks or equivalent per week    Comment: occasional   Drug use: Never   Sexual activity: Not on file

## 2021-12-03 NOTE — Addendum Note (Signed)
Addended by: Melton Alar on: 12/03/2021 09:38 AM   Modules accepted: Orders

## 2021-12-09 ENCOUNTER — Other Ambulatory Visit (HOSPITAL_COMMUNITY): Payer: Self-pay | Admitting: Interventional Radiology

## 2021-12-09 ENCOUNTER — Other Ambulatory Visit (HOSPITAL_COMMUNITY): Payer: Self-pay | Admitting: Internal Medicine

## 2021-12-09 ENCOUNTER — Telehealth (HOSPITAL_COMMUNITY): Payer: Self-pay

## 2021-12-09 DIAGNOSIS — M8448XA Pathological fracture, other site, initial encounter for fracture: Secondary | ICD-10-CM

## 2021-12-09 NOTE — Telephone Encounter (Signed)
Ok per Dr. Estanislado Pandy for L4 KP/ablation w/biopsy. AW - Will check with Dr. Durward Fortes to make sure he wants a biopsy. AW

## 2021-12-10 ENCOUNTER — Telehealth (HOSPITAL_COMMUNITY): Payer: Self-pay

## 2021-12-10 NOTE — Telephone Encounter (Signed)
Called to schedule kp, no answer, left vm for pt to call me or Anderson Malta to schedule. AW

## 2021-12-15 ENCOUNTER — Telehealth (HOSPITAL_COMMUNITY): Payer: Self-pay

## 2021-12-15 NOTE — Telephone Encounter (Signed)
Returned pt's call, all questions answered about upcoming procedure. AW

## 2021-12-16 ENCOUNTER — Other Ambulatory Visit: Payer: Self-pay | Admitting: Radiology

## 2021-12-16 DIAGNOSIS — M549 Dorsalgia, unspecified: Secondary | ICD-10-CM

## 2021-12-16 NOTE — Progress Notes (Signed)
Phone call to pt to inform him of arrival time, driver needed, npo status for procedure tomorrow 12/17/21. Pt verbalizes understanding.

## 2021-12-17 ENCOUNTER — Other Ambulatory Visit: Payer: Self-pay

## 2021-12-17 ENCOUNTER — Ambulatory Visit (HOSPITAL_COMMUNITY): Admission: RE | Admit: 2021-12-17 | Payer: Medicare Other | Source: Ambulatory Visit

## 2021-12-17 ENCOUNTER — Ambulatory Visit (HOSPITAL_COMMUNITY)
Admission: RE | Admit: 2021-12-17 | Discharge: 2021-12-17 | Disposition: A | Discharge status: Home / Self Care | Payer: Medicare Other | Source: Ambulatory Visit | Attending: Interventional Radiology | Admitting: Interventional Radiology

## 2021-12-17 DIAGNOSIS — C9030 Solitary plasmacytoma not having achieved remission: Secondary | ICD-10-CM | POA: Insufficient documentation

## 2021-12-17 DIAGNOSIS — M8448XA Pathological fracture, other site, initial encounter for fracture: Secondary | ICD-10-CM | POA: Diagnosis not present

## 2021-12-17 DIAGNOSIS — M5136 Other intervertebral disc degeneration, lumbar region: Secondary | ICD-10-CM | POA: Diagnosis not present

## 2021-12-17 DIAGNOSIS — Z7982 Long term (current) use of aspirin: Secondary | ICD-10-CM | POA: Diagnosis not present

## 2021-12-17 DIAGNOSIS — M549 Dorsalgia, unspecified: Secondary | ICD-10-CM

## 2021-12-17 HISTORY — PX: IR KYPHO LUMBAR INC FX REDUCE BONE BX UNI/BIL CANNULATION INC/IMAGING: IMG5519

## 2021-12-17 HISTORY — PX: IR BONE TUMOR(S)RF ABLATION: IMG2284

## 2021-12-17 LAB — CBC
HCT: 48 % (ref 39.0–52.0)
Hemoglobin: 16.4 g/dL (ref 13.0–17.0)
MCH: 30.8 pg (ref 26.0–34.0)
MCHC: 34.2 g/dL (ref 30.0–36.0)
MCV: 90.1 fL (ref 80.0–100.0)
Platelets: 196 10*3/uL (ref 150–400)
RBC: 5.33 MIL/uL (ref 4.22–5.81)
RDW: 12 % (ref 11.5–15.5)
WBC: 6.5 10*3/uL (ref 4.0–10.5)
nRBC: 0 % (ref 0.0–0.2)

## 2021-12-17 LAB — PROTIME-INR
INR: 0.9 (ref 0.8–1.2)
Prothrombin Time: 12.2 seconds (ref 11.4–15.2)

## 2021-12-17 LAB — BASIC METABOLIC PANEL
Anion gap: 6 (ref 5–15)
BUN: 16 mg/dL (ref 8–23)
CO2: 24 mmol/L (ref 22–32)
Calcium: 9.2 mg/dL (ref 8.9–10.3)
Chloride: 107 mmol/L (ref 98–111)
Creatinine, Ser: 1.1 mg/dL (ref 0.61–1.24)
GFR, Estimated: >60 mL/min (ref >60)
Glucose, Bld: 119 mg/dL — ABNORMAL HIGH (ref 70–99)
Potassium: 4 mmol/L (ref 3.5–5.1)
Sodium: 137 mmol/L (ref 135–145)

## 2021-12-17 MED ORDER — MIDAZOLAM HCL 2 MG/2ML IJ SOLN
INTRAMUSCULAR | Status: AC | PRN
  Administered 2021-12-17 (×3): 1 mg via INTRAVENOUS

## 2021-12-17 MED ORDER — BUPIVACAINE HCL (PF) 0.5 % IJ SOLN
INTRAMUSCULAR | Status: AC
  Filled 2021-12-17: qty 30

## 2021-12-17 MED ORDER — HYDROMORPHONE HCL 1 MG/ML IJ SOLN
INTRAMUSCULAR | Status: AC | PRN
  Administered 2021-12-17: 1 mg via INTRAVENOUS
  Administered 2021-12-17: .5 mg via INTRAVENOUS

## 2021-12-17 MED ORDER — TOBRAMYCIN SULFATE 1.2 G IJ SOLR
INTRAMUSCULAR | Status: AC
  Filled 2021-12-17: qty 1.2

## 2021-12-17 MED ORDER — SODIUM CHLORIDE 0.9 % IV SOLN: INTRAVENOUS | Status: AC

## 2021-12-17 MED ORDER — VANCOMYCIN HCL IN DEXTROSE 1-5 GM/200ML-% IV SOLN
INTRAVENOUS | Status: AC
  Administered 2021-12-17: 1000 mg via INTRAVENOUS
  Filled 2021-12-17: qty 200

## 2021-12-17 MED ORDER — OXYCODONE-ACETAMINOPHEN 5-325 MG PO TABS
1.0000 | ORAL_TABLET | Freq: Once | ORAL | Status: AC
  Administered 2021-12-17: 2 via ORAL
  Filled 2021-12-17: qty 2

## 2021-12-17 MED ORDER — MIDAZOLAM HCL 2 MG/2ML IJ SOLN
INTRAMUSCULAR | Status: AC
  Filled 2021-12-17: qty 2

## 2021-12-17 MED ORDER — IOHEXOL 300 MG/ML  SOLN: 100.0000 mL | Freq: Once | INTRAMUSCULAR | Status: DC | PRN

## 2021-12-17 MED ORDER — HYDROMORPHONE HCL 1 MG/ML IJ SOLN
INTRAMUSCULAR | Status: AC
  Filled 2021-12-17: qty 1

## 2021-12-17 MED ORDER — FENTANYL CITRATE (PF) 100 MCG/2ML IJ SOLN
INTRAMUSCULAR | Status: AC
  Filled 2021-12-17: qty 2

## 2021-12-17 MED ORDER — FENTANYL CITRATE (PF) 100 MCG/2ML IJ SOLN
INTRAMUSCULAR | Status: AC | PRN
  Administered 2021-12-17 (×4): 25 ug via INTRAVENOUS

## 2021-12-17 MED ORDER — SODIUM CHLORIDE 0.9 % IV SOLN: INTRAVENOUS | Status: DC

## 2021-12-17 MED ORDER — LIDOCAINE HCL (PF) 1 % IJ SOLN
INTRAMUSCULAR | Status: AC
  Filled 2021-12-17: qty 30

## 2021-12-17 MED ORDER — VANCOMYCIN HCL IN DEXTROSE 1-5 GM/200ML-% IV SOLN: 1000.0000 mg | INTRAVENOUS | Status: AC

## 2021-12-17 NOTE — Progress Notes (Signed)
Patient complaining of 8/10 pain at site in lower back. No meds currently ordered. Patient states he takes Percocet 5-325 mg at home and this works. Suezanne Jacquet, [place] notified and orders placed for this medication.

## 2021-12-17 NOTE — H&P (Signed)
Chief Complaint: Patient was seen in consultation today for vertebral fracture at the request of [name], MD  Referring Physician(s): Dr. [name]  Supervising Physician: Corrie Mckusick  Patient Status: [facility] - Out[facility]  History of Present Illness: Chad Flores is a 71 y.o. male who experienced acute onset of low back pain in July.  He was bent over and felt a "pop" in his back.  At that point he could not extend or move.  He was taken to the hospital by ambulance.  Subsequent work-up demonstrated a compression fracture of L4 with the possibility of a pathologic fracture.  He was placed on oxycodone and a thoracolumbar support and notes that he feels considerably better.   His baseline pain this morning is a 4/10.  He still has pain when he coughs, twists or turns that is a 10/10.  He says if he moves slowly there is no increased pain at all.  He initially was having some pain into his right leg but that has resolved.  He does not have any change in bowel or bladder function.   IR has been asked to perform K4 kyphoplasty with bone biopsy and osteocool ablation.  Past Medical History:  Diagnosis Date   Abdominal aortic aneurysm (AAA) ([place])    followed by pcp---  per last ultrasound in epic 08-15-2019, proximal 3.3cm   Anxiety    Bladder mass    urologist-- dr winter   COPD (chronic obstructive pulmonary disease) ([place])    followed by pcp---  last exacerbation approx. 2019,  does not have rescue, controlled with daily advair   Diet-controlled diabetes mellitus ([place])    GERD (gastroesophageal reflux disease)    Osteoarthritis    Pulmonary nodules    followed by pcp---- last chest CT in epic 11-04-2020 bilateral tiny nodules stable   Urgency of urination     Past Surgical History:  Procedure Laterality Date   CHOLECYSTECTOMY OPEN  1987   CYSTOSCOPY N/A 11/26/2020   Procedure: CYSTOSCOPY WITH EXAM UNDER ANESTHESIA/  TRANSURETHRAL RESECTION OF BLADDER TUMOR WITH  GEMCITABINE INSTILLATION IN PACU , BILATERAL RETROGRADES,  RIGHT STENT PLACEMENT;  Surgeon: Ceasar Mons, MD;  Location: [facility];  Service: Urology;  Laterality: N/A;   ELBOW SURGERY Right 2008   KNEE ARTHROSCOPY W/ MENISCAL REPAIR Left    x3  last one 2012   TOTAL KNEE ARTHROPLASTY Left 03/22/2017   Procedure: LEFT TOTAL KNEE ARTHROPLASTY;  Surgeon: Garald Balding, MD;  Location: [facility];  Service: Orthopedics;  Laterality: Left;   TRIGGER FINGER RELEASE Right 2007    Allergies: Penicillin g  Medications: Prior to Admission medications   Medication Sig Start Date End Date Taking? Authorizing Provider  acetaminophen (TYLENOL) 500 MG tablet Take 500 mg by mouth every 6 (six) hours as needed for mild pain or headache.   Yes [provider]  albuterol (VENTOLIN HFA) 108 (90 Base) MCG/ACT inhaler Inhale 2 puffs into the lungs every 6 (six) hours as needed for wheezing or shortness of breath.   Yes [provider]  ascorbic acid (VITAMIN C) 500 MG tablet Take 500 mg by mouth daily.   Yes [provider]  aspirin EC 81 MG tablet Take 81 mg by mouth daily.   Yes [provider]  atorvastatin (LIPITOR) 10 MG tablet Take 10 mg by mouth daily. 01/26/17  Yes [provider]  buPROPion (WELLBUTRIN XL) 150 MG 24 hr tablet Take 150 mg by mouth daily.  01/26/17  Yes [provider]  calcium carbonate (TUMS - DOSED IN MG ELEMENTAL CALCIUM) 500 MG chewable tablet Chew 1 tablet by mouth as needed for indigestion or heartburn.   Yes [provider]  cholecalciferol (VITAMIN D3) 25 MCG (1000 UNIT) tablet Take 1,000 Units by mouth daily with breakfast.   Yes [provider]  NONFORMULARY OR COMPOUNDED ITEM Callous/Corn Cream: Salicylic acid 89%, Urea 21% Cream Order faxed to [facility] Patient taking differently: Apply 1 application  topically See admin instructions. Callous/Corn Cream: Salicylic acid  19%, Urea 41% Cream- Apply to affected corns once a day before bandaging as needed 01/17/20  Yes Trula Slade, DPM  oxyCODONE-acetaminophen (PERCOCET) 5-325 MG tablet Take 1 tablet by mouth every 4 (four) hours as needed for severe pain. 11/26/21 11/26/22 Yes Pahwani, Einar Grad, MD  tamsulosin (FLOMAX) 0.4 MG CAPS capsule Take 1 capsule (0.4 mg total) by mouth daily. Patient taking differently: Take 0.4 mg by mouth in the morning and at bedtime. 11/26/20  Yes Ceasar Mons, MD  vitamin B-12 (CYANOCOBALAMIN) 1000 MCG tablet Take 1,000 mcg by mouth daily.   Yes [provider]  zinc gluconate 50 MG tablet Take 50 mg by mouth daily.   Yes [provider]     Family History  Problem Relation Age of Onset   Alzheimer's disease Mother    Cancer Father     Social History   Socioeconomic History   Marital status: Married    Spouse name: Not on file   Number of children: Not on file   Years of education: Not on file   Highest education level: Not on file  Occupational History   Not on file  Tobacco Use   Smoking status: Every Day    Packs/day: 0.50    Years: 50.00    Total pack years: 25.00    Types: Cigarettes   Smokeless tobacco: Never  Vaping Use   Vaping Use: Former   Quit date: 12/12/2018   Devices: vaped, unsure name  Substance and Sexual Activity   Alcohol use: Yes    Alcohol/week: 1.0 standard drink of alcohol    Types: 1 Standard drinks or equivalent per week    Comment: occasional   Drug use: Never   Sexual activity: Not on file  Other Topics Concern   Not on file  Social History Narrative   Not on file   Social Determinants of Health   Financial Resource Strain: Not on file  Food Insecurity: Not on file  Transportation Needs: Not on file  Physical Activity: Not on file  Stress: Not on file  Social Connections: Not on file     Review of Systems: A 12 point ROS discussed and pertinent positives are indicated in the HPI above.  All  other systems are negative.  Vital Signs: BP 121/72   Pulse 81   Temp 98.7 F (37.1 C) (Oral)   Resp 18   Ht '5\' 10"'$  (1.778 m)   Wt 197 lb (89.4 kg)   SpO2 93%   BMI 28.27 kg/m   Physical Exam Constitutional:      General: He is not in acute distress.    Appearance: Normal appearance. He is not ill-appearing.  HENT:     Head: Normocephalic and atraumatic.     Mouth/Throat:     Mouth: Mucous membranes are moist.     Pharynx: Oropharynx is clear.  Eyes:     Extraocular Movements: Extraocular movements intact.  Conjunctiva/sclera: Conjunctivae normal.  Cardiovascular:     Rate and Rhythm: Normal rate and regular rhythm.  Pulmonary:     Effort: Pulmonary effort is normal.     Breath sounds: Normal breath sounds.  Abdominal:     General: Abdomen is flat.     Palpations: Abdomen is soft.  Skin:    General: Skin is warm and dry.     Capillary Refill: Capillary refill takes less than 2 seconds.  Neurological:     General: No focal deficit present.     Mental Status: He is alert and oriented to person, place, and time.  Psychiatric:        Mood and Affect: Mood normal.        Behavior: Behavior normal.     Imaging: MR Lumbar Spine W Wo Contrast  Result Date: 11/25/2021 CLINICAL DATA:  Low back pain, cauda equina syndrome suspected EXAM: MRI LUMBAR SPINE WITHOUT AND WITH CONTRAST TECHNIQUE: Multiplanar and multiecho pulse sequences of the lumbar spine were obtained without and with intravenous contrast. CONTRAST:  36m GADAVIST GADOBUTROL 1 MMOL/ML IV SOLN COMPARISON:  None Available. FINDINGS: Segmentation:  Standard. Alignment:  Preserved. Vertebrae: Minimal loss of height at the inferior endplate of L4. There is abnormal marrow signal within the L4 vertebral body on the right with corresponding enhancement. No extraosseous extension. There is mild enhancement adjacent to the left L4-L5 facets and within the left L5 pedicle favored to be on a degenerative basis. Conus  medullaris and cauda equina: Conus extends to the L1 level. Conus and cauda equina appear normal. No abnormal intrathecal enhancement. Paraspinal and other soft tissues: Unremarkable. Disc levels: L1-L2:  No stenosis. L2-L3:  Disc bulge.  No stenosis. L3-L4: Disc bulge. No canal or foraminal stenosis. Slight effacement of right subarticular recess. L4-L5: Disc bulge with superimposed left foraminal protrusion. Left greater than right facet arthropathy. No canal or right foraminal stenosis. Mild left foraminal stenosis. L5-S1:  Disc bulge.  No canal or foraminal stenosis. IMPRESSION: Abnormal marrow signal at L4 suspicious for metastatic disease. No extraosseous extension. Minor loss of height at the inferior endplate may be pathologic. Mild enhancement at the left L5 pedicle and adjacent to left L4-L5 facets is favored to reflect facet arthropathy. Electronically Signed   By: PMacy MisM.D.   On: 11/25/2021 16:33    Labs:  CBC: Recent Labs    11/25/21 1204 11/25/21 1819 11/26/21 0327 12/17/21 0705  WBC 6.8 7.9 11.5* 6.5  HGB 15.6 16.6 15.3 16.4  HCT 46.3 48.5 45.9 48.0  PLT 178 180 173 196    COAGS: Recent Labs    12/17/21 0705  INR 0.9    BMP: Recent Labs    11/25/21 1204 11/25/21 1819 11/26/21 0327 12/17/21 0705  NA 141  --  141 137  K 3.8  --  3.9 4.0  CL 111  --  111 107  CO2 24  --  24 24  GLUCOSE 109*  --  145* 119*  BUN 16  --  21 16  CALCIUM 8.8*  --  8.8* 9.2  CREATININE 0.89 0.89 1.05 1.10  GFRNONAA >60 >60 >60 >60    LIVER FUNCTION TESTS: Recent Labs    11/25/21 1204  BILITOT 1.0  AST 23  ALT 22  ALKPHOS 125  PROT 6.4*  ALBUMIN 3.6   Assessment and Plan:  L4 vertebral fracture --labs, imaging, history, and vitals reviewed --stopped ASA 3 days ago, is NPO --OK to proceed with planned KP,  biopsy, and osteocool ablation  Thank you for this interesting consult.  I greatly enjoyed meeting CALLIN ASHE and look forward to participating in  their care.  A copy of this report was sent to the requesting provider on this date.  Electronically Signed: Pasty Spillers, PA 12/17/2021, 8:02 AM   I spent a total of 40 Minutes  in face to face in clinical consultation, greater than 50% of which was counseling/coordinating care for lumbar vertebral fracture.

## 2021-12-17 NOTE — Procedures (Signed)
INR. Status post L4 vertebral body ablation for painful pathologic compression fracture ,followed by augmentation with balloon kyphoplasty.  Biopsies obtained and sent follow-up pathologic analysis.  Patient tolerated the procedure well.  Arlean Hopping MD.

## 2021-12-17 NOTE — Discharge Instructions (Signed)
1.  No stooping bending or lifting weights above 10 pounds for 2 weeks.  2.  No driving for 2 weeks.  3.  Use walker to ambulate for 2 weeks.  4.  Call Dr. Rudene Anda office to obtain results of the biopsy.  5.  Return as needed in 2 weeks.

## 2021-12-17 NOTE — Sedation Documentation (Signed)
Patient alert/oriented x 4 after procedure. Patient able to move from IR table back to bed with minimal assistance from staff. Patient denies pain or discomfort. Vital signs stable. Patient and wife updated by Dr. Estanislado Pandy post procedure. Patient and belongings transported to Short Stay for post op recovery.

## 2021-12-21 ENCOUNTER — Telehealth: Payer: Self-pay | Admitting: Physician Assistant

## 2021-12-21 ENCOUNTER — Other Ambulatory Visit: Payer: Self-pay | Admitting: Family Medicine

## 2021-12-21 DIAGNOSIS — M899 Disorder of bone, unspecified: Secondary | ICD-10-CM | POA: Diagnosis not present

## 2021-12-21 DIAGNOSIS — R609 Edema, unspecified: Secondary | ICD-10-CM | POA: Diagnosis not present

## 2021-12-21 DIAGNOSIS — M7989 Other specified soft tissue disorders: Secondary | ICD-10-CM

## 2021-12-21 DIAGNOSIS — M4850XA Collapsed vertebra, not elsewhere classified, site unspecified, initial encounter for fracture: Secondary | ICD-10-CM | POA: Diagnosis not present

## 2021-12-21 NOTE — Telephone Encounter (Signed)
Patient called advised he had a biopsy done 12/17/2021 and need to know the results. The number to contact patient is [phone]  Note: Patient said he will be at this number until 12:35pm.

## 2021-12-22 ENCOUNTER — Ambulatory Visit
Admission: RE | Admit: 2021-12-22 | Discharge: 2021-12-22 | Disposition: A | Discharge status: Home / Self Care | Payer: Medicare Other | Source: Ambulatory Visit | Attending: Family Medicine | Admitting: Family Medicine

## 2021-12-22 DIAGNOSIS — M7989 Other specified soft tissue disorders: Secondary | ICD-10-CM

## 2021-12-22 LAB — SURGICAL PATHOLOGY

## 2021-12-23 ENCOUNTER — Other Ambulatory Visit: Payer: Self-pay | Admitting: Physician Assistant

## 2021-12-23 DIAGNOSIS — C9030 Solitary plasmacytoma not having achieved remission: Secondary | ICD-10-CM

## 2021-12-27 NOTE — Telephone Encounter (Signed)
Thanks, Bevely Palmer

## 2021-12-28 ENCOUNTER — Ambulatory Visit (HOSPITAL_BASED_OUTPATIENT_CLINIC_OR_DEPARTMENT_OTHER)
Admission: RE | Admit: 2021-12-28 | Discharge: 2021-12-28 | Disposition: A | Discharge status: Home / Self Care | Payer: Medicare Other | Source: Ambulatory Visit | Attending: Oncology | Admitting: Oncology

## 2021-12-28 ENCOUNTER — Inpatient Hospital Stay: Payer: Medicare Other | Admitting: Oncology

## 2021-12-28 ENCOUNTER — Inpatient Hospital Stay: Payer: Medicare Other | Attending: Oncology | Admitting: Oncology

## 2021-12-28 ENCOUNTER — Inpatient Hospital Stay: Payer: Medicare Other

## 2021-12-28 ENCOUNTER — Other Ambulatory Visit: Payer: Self-pay | Admitting: *Deleted

## 2021-12-28 VITALS — BP 107/65 | HR 91 | Temp 98.2°F | Resp 18 | Ht 70.0 in | Wt 194.2 lb

## 2021-12-28 DIAGNOSIS — Z803 Family history of malignant neoplasm of breast: Secondary | ICD-10-CM | POA: Diagnosis not present

## 2021-12-28 DIAGNOSIS — Z8551 Personal history of malignant neoplasm of bladder: Secondary | ICD-10-CM | POA: Diagnosis not present

## 2021-12-28 DIAGNOSIS — E119 Type 2 diabetes mellitus without complications: Secondary | ICD-10-CM | POA: Insufficient documentation

## 2021-12-28 DIAGNOSIS — D72822 Plasmacytosis: Secondary | ICD-10-CM

## 2021-12-28 DIAGNOSIS — Z808 Family history of malignant neoplasm of other organs or systems: Secondary | ICD-10-CM

## 2021-12-28 DIAGNOSIS — C9030 Solitary plasmacytoma not having achieved remission: Secondary | ICD-10-CM | POA: Insufficient documentation

## 2021-12-28 DIAGNOSIS — J449 Chronic obstructive pulmonary disease, unspecified: Secondary | ICD-10-CM | POA: Insufficient documentation

## 2021-12-28 DIAGNOSIS — M549 Dorsalgia, unspecified: Secondary | ICD-10-CM | POA: Insufficient documentation

## 2021-12-28 DIAGNOSIS — F17210 Nicotine dependence, cigarettes, uncomplicated: Secondary | ICD-10-CM | POA: Insufficient documentation

## 2021-12-28 NOTE — Progress Notes (Signed)
[facility] Patient Consult   Requesting MD: London Pepper, Md [place],  [place] [address]   MONROE QIN 71 y.o.  [birth year]    Reason for Consult: Plasmacytoma   HPI: Mr. Levesque 41-year history of low back pain.  The pain acutely worsened on 11/25/2021 and he presented to the emergency room.  He felt a "pop "when bending over.  An MRI of the lumbar spine revealed abnormal marrow signal at L4 suspicious for metastatic disease.  No extraosseous extension, moderate loss of height at the inferior endplate of L4.  Mild enhancement at the left L5 pedicle and adjacent to the left L4-5 facets favored to reflect arthropathy.  He was admitted and then discharged the following day.  He was referred to interventional radiology and underwent an L4 biopsy, augmentation kyphoplasty, and radiofrequency ablation of L4 on 12/17/2021.  The pathology revealed a plasmacytoma.  Sheets of cells are positive for CD138, MUM1, and are kappa restricted.  The Ki-67 returned at 5-10%. He reports improvement in back pain following the kyphoplasty procedure.  He continues to have discomfort in the right buttock and groin.  Past Medical History:  Diagnosis Date   Abdominal aortic aneurysm (AAA) ([place])    followed by pcp---  per last ultrasound in epic 08-15-2019, proximal 3.3cm   Anxiety    Bladder mass    urologist-- dr winter   COPD (chronic obstructive pulmonary disease) ([place])    followed by pcp---  last exacerbation approx. 2019,  does not have rescue, controlled with daily advair   Diet-controlled diabetes mellitus ([place])    GERD (gastroesophageal reflux disease)    Osteoarthritis    Pulmonary nodules    followed by pcp---- last chest CT in epic 11-04-2020 bilateral tiny nodules stable   Urgency of urination      .  Urothelial carcinoma, noninvasive low-grade/high-grade-status post TURBT 11/26/2020 followed by induction/maintenance BCG Past  Surgical History:  Procedure Laterality Date   CHOLECYSTECTOMY OPEN  1987   CYSTOSCOPY N/A 11/26/2020   Procedure: CYSTOSCOPY WITH EXAM UNDER ANESTHESIA/  TRANSURETHRAL RESECTION OF BLADDER TUMOR WITH GEMCITABINE INSTILLATION IN PACU , BILATERAL RETROGRADES,  RIGHT STENT PLACEMENT;  Surgeon: Ceasar Mons, MD;  Location[facility];  Service: Urology;  Laterality: N/A;   ELBOW SURGERY Right 2008   IR BONE TUMOR(S)RF ABLATION  12/17/2021   IR KYPHO LUMBAR INC FX REDUCE BONE BX UNI/BIL CANNULATION INC/IMAGING  12/17/2021   KNEE ARTHROSCOPY W/ MENISCAL REPAIR Left    x3  last one 2012   TOTAL KNEE ARTHROPLASTY Left 03/22/2017   Procedure: LEFT TOTAL KNEE ARTHROPLASTY;  Surgeon: Garald Balding, MD;  Location: [place] [facility];  Service: Orthopedics;  Laterality: Left;   TRIGGER FINGER RELEASE Right 2007    .  Tonsillectomy  Medications: Reviewed  Allergies:  Allergies  Allergen Reactions   Penicillin G Itching, Swelling, Rash and Other (See Comments)    Swelling of lips, hands, feet and weakness/dizziness,    Family history: His father had liver cancer and was an alcoholic.  A maternal niece had breast cancer in her 93s.  Social History:   He lives with his wife in [place].  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  He is also retired from [employer].  He smokes 1/2 pack of cigarettes per day.  Rare alcohol use.  No transfusion history.  No risk factor for HIV or hepatitis.  ROS:  Positives include: Hematuria following BCG therapy, intermittent cough at night, low back pain, right hip/buttock/groin pain  A complete ROS was otherwise negative.  Physical Exam:  Blood pressure 107/65, pulse 91, temperature 98.2 F (36.8 C), temperature source Oral, resp. rate 18, height _0  (1.778 m), weight 194 lb 3.2 oz (88.1 kg), SpO2 98 %.  HEENT: Less than 1 cm yellowish rounded lesion at the left tonsillar fossa Lungs: Clear  bilaterally Cardiac: Regular rate and rhythm Abdomen: No hepatosplenomegaly  Vascular: No leg edema Lymph nodes: No cervical, supraclavicular, axillary, or inguinal nodes Neurologic: Alert and oriented, the motor exam appears intact in the upper and lower extremities bilaterally Skin: No rash Musculoskeletal: No spine tenderness   LAB:  CBC  Lab Results  Component Value Date   WBC 6.5 12/17/2021   HGB 16.4 12/17/2021   HCT 48.0 12/17/2021   MCV 90.1 12/17/2021   PLT 196 12/17/2021   NEUTROABS 4.3 11/25/2021        CMP  Lab Results  Component Value Date   NA 137 12/17/2021   K 4.0 12/17/2021   CL 107 12/17/2021   CO2 24 12/17/2021   GLUCOSE 119 (H) 12/17/2021   BUN 16 12/17/2021   CREATININE 1.10 12/17/2021   CALCIUM 9.2 12/17/2021   PROT 6.4 (L) 11/25/2021   ALBUMIN 3.6 11/25/2021   AST 23 11/25/2021   ALT 22 11/25/2021   ALKPHOS 125 11/25/2021   BILITOT 1.0 11/25/2021   GFRNONAA >60 12/17/2021   GFRAA >60 03/24/2017     No results found for: "CEA1", "YGE720", "CA125"  Imaging: As per HPI-MRI images from 11/25/2021 reviewed with Mr. Uemura    Assessment/Plan:   L4 plasmacytoma MRI lumbar spine 11/25/2021-abnormal marrow signal at L4, moderate loss of inferior endplate height, mild enhancement at the left L5 pedicle and adjacent to left L4-5 facets favored to reflect arthropathy L4 biopsy 12/17/2021-plasmacytoma, kappa light chain restricted Back pain secondary to #1 L4 kyphoplasty and ablation 12/17/2021 3.  Noninvasive urothelial carcinoma of the bladder-status post TURBT 11/26/2020 followed by induction and maintenance BCG therapy 4.  COPD 5.  Tobacco use-followed in the lung cancer screening program   Disposition:   Mr Arts has been diagnosed with an L4 plasmacytoma.  I reviewed the plasmacytoma diagnosis and treatment options with him.  I recommend he undergo additional diagnostic evaluation to exclude a systemic plasma cell disorder.  He  will undergo a diagnostic bone marrow biopsy and metastatic bone survey.  He will return to the lab today for a myeloma panel.  Mr. Schreier will return for an office visit and further discussion after the bone marrow biopsy.  We will decide on localized treatment of the L4 lesion versus systemic therapy based on the bone marrow biopsy findings.    Betsy Coder, MD  12/28/2021, 4:57 PM

## 2021-12-29 LAB — KAPPA/LAMBDA LIGHT CHAINS
Kappa free light chain: 26.5 mg/L — ABNORMAL HIGH (ref 3.3–19.4)
Kappa, lambda light chain ratio: 1.56 (ref 0.26–1.65)
Lambda free light chains: 17 mg/L (ref 5.7–26.3)

## 2021-12-29 LAB — BETA 2 MICROGLOBULIN, SERUM: Beta-2 Microglobulin: 2.2 mg/L (ref 0.6–2.4)

## 2021-12-31 ENCOUNTER — Telehealth: Payer: Self-pay

## 2021-12-31 ENCOUNTER — Ambulatory Visit (INDEPENDENT_AMBULATORY_CARE_PROVIDER_SITE_OTHER): Payer: Medicare Other | Admitting: Orthopaedic Surgery

## 2021-12-31 ENCOUNTER — Encounter: Payer: Self-pay | Admitting: Orthopaedic Surgery

## 2021-12-31 DIAGNOSIS — M8448XS Pathological fracture, other site, sequela: Secondary | ICD-10-CM

## 2021-12-31 NOTE — Progress Notes (Signed)
Office Visit Note   Patient: Chad Flores           Date of Birth: 03/03/1951           MRN: [id number] Visit Date: 12/31/2021              Requested by: Chad Pepper, MD [address] [place],  [place] [address] PCP: Chad Pepper, MD   Assessment & Plan: Visit Diagnoses:  1. Pathological fracture of lumbar vertebra, sequela     Plan: Chad Flores diagnosed with a pathologic fracture of L4.  He had a kyphoplasty and relates that it is made a huge difference in his pain.  The biopsy at the time of the kyphoplasty was positive for plasmacytoma.  He is being followed by Chad Flores at the cancer center.  Bone survey did not reveal any other areas of abnormality and he is scheduled to have radiation to the L4 vertebral body.  Eyes any lower extremity discomfort except on occasion he will have some discomfort in his right groin.  Again, films were negative.  I suspect it may be referred from his back.  He is walking without any ambulatory aid and not using his back support.  I will be plan to see him back anytime in the future he will continue to be followed through the oncology service  Follow-Up Instructions: Return if symptoms worsen or fail to improve.   Orders:  No orders of the defined types were placed in this encounter.  No orders of the defined types were placed in this encounter.     Procedures: No procedures performed   Clinical Data: No additional findings.   Subjective: Chief Complaint  Patient presents with   Lower Back - Follow-up   Patient presents today for follow up of his lower back pain. He states that back pain has decreased however has now noticed that he has been having increased right hip pain which has been radiating into his right groin. He states that this has been ongoing for around 1 weeks. At this time patient is currently taking OTC ibuprofen and OTC tylenol for pain.   Review of Systems   Objective: Vital Signs: There  were no vitals taken for this visit.  Physical Exam Constitutional:      Appearance: He is well-developed.  Eyes:     Pupils: Pupils are equal, round, and reactive to light.  Pulmonary:     Effort: Pulmonary effort is normal.  Skin:    General: Skin is warm and dry.  Neurological:     Mental Status: He is alert and oriented to person, place, and time.  Psychiatric:        Behavior: Behavior normal.     Ortho Exam awake alert and oriented x3.  Comfortable sitting.  Not wearing a brace today able to get up and down easily from a low sitting position and walk without a limp.  Very minimal percussible tenderness to lower lumbar spine.  Straight leg raise negative.  Painless range of motion both hips  Specialty Comments:  No specialty comments available.  Imaging: No results found.   PMFS History: Patient Active Problem List   Diagnosis Date Noted   Lumbar vertebral fracture, pathologic 12/02/2021   L4 vertebral fracture ([place]) 11/25/2021   Mixed hyperlipidemia 11/25/2021   Urothelial cancer ([place]) 11/25/2021   Olecranon bursitis of left elbow 06/02/2021   Primary osteoarthritis of left knee 03/22/2017   S/P total knee replacement using  cement, left 03/22/2017   Past Medical History:  Diagnosis Date   Abdominal aortic aneurysm (AAA) ([place])    followed by pcp---  per last ultrasound in epic 08-15-2019, proximal 3.3cm   Anxiety    Bladder mass    urologist-- dr Flores   COPD (chronic obstructive pulmonary disease) ([place])    followed by pcp---  last exacerbation approx. 2019,  does not have rescue, controlled with daily advair   Diet-controlled diabetes mellitus ([place])    GERD (gastroesophageal reflux disease)    Osteoarthritis    Pulmonary nodules    followed by pcp---- last chest CT in epic 11-04-2020 bilateral tiny nodules stable   Urgency of urination     Family History  Problem Relation Age of Onset   Alzheimer's disease Mother    Cancer Father     Past Surgical  History:  Procedure Laterality Date   CHOLECYSTECTOMY OPEN  1987   CYSTOSCOPY N/A 11/26/2020   Procedure: CYSTOSCOPY WITH EXAM UNDER ANESTHESIA/  TRANSURETHRAL RESECTION OF BLADDER TUMOR WITH GEMCITABINE INSTILLATION IN PACU , BILATERAL RETROGRADES,  RIGHT STENT PLACEMENT;  Surgeon: Chad Mons, MD;  Location: [facility];  Service: Urology;  Laterality: N/A;   ELBOW SURGERY Right 2008   IR BONE TUMOR(S)RF ABLATION  12/17/2021   IR KYPHO LUMBAR INC FX REDUCE BONE BX UNI/BIL CANNULATION INC/IMAGING  12/17/2021   KNEE ARTHROSCOPY W/ MENISCAL REPAIR Left    x3  last one 2012   TOTAL KNEE ARTHROPLASTY Left 03/22/2017   Procedure: LEFT TOTAL KNEE ARTHROPLASTY;  Surgeon: Chad Balding, MD;  Location: [facility];  Service: Orthopedics;  Laterality: Left;   TRIGGER FINGER RELEASE Right 2007   Social History   Occupational History   Not on file  Tobacco Use   Smoking status: Every Day    Packs/day: 0.50    Years: 50.00    Total pack years: 25.00    Types: Cigarettes   Smokeless tobacco: Never  Vaping Use   Vaping Use: Former   Quit date: 12/12/2018   Devices: vaped, unsure name  Substance and Sexual Activity   Alcohol use: Yes    Alcohol/week: 1.0 standard drink of alcohol    Types: 1 Standard drinks or equivalent per week    Comment: occasional   Drug use: Never   Sexual activity: Not on file

## 2021-12-31 NOTE — Telephone Encounter (Signed)
-----  Message from Ladell Pier, MD sent at 12/30/2021  8:20 PM EDT ----- Please call patient, metastatic bone survey is negative for lytic lesions, no evidence of multiple myeloma

## 2021-12-31 NOTE — Telephone Encounter (Signed)
Pt verbalized understanding.

## 2022-01-04 LAB — MULTIPLE MYELOMA PANEL, SERUM
Albumin SerPl Elph-Mcnc: 3.8 g/dL (ref 2.9–4.4)
Albumin/Glob SerPl: 1.2 (ref 0.7–1.7)
Alpha 1: 0.3 g/dL (ref 0.0–0.4)
Alpha2 Glob SerPl Elph-Mcnc: 1 g/dL (ref 0.4–1.0)
B-Globulin SerPl Elph-Mcnc: 1 g/dL (ref 0.7–1.3)
Gamma Glob SerPl Elph-Mcnc: 1 g/dL (ref 0.4–1.8)
Globulin, Total: 3.2 g/dL (ref 2.2–3.9)
IgA: 83 mg/dL (ref 61–437)
IgG (Immunoglobin G), Serum: 1064 mg/dL (ref 603–1613)
IgM (Immunoglobulin M), Srm: 72 mg/dL (ref 15–143)
M Protein SerPl Elph-Mcnc: 0.4 g/dL — ABNORMAL HIGH
Total Protein ELP: 7 g/dL (ref 6.0–8.5)

## 2022-01-05 ENCOUNTER — Other Ambulatory Visit: Payer: Self-pay

## 2022-01-05 DIAGNOSIS — C9030 Solitary plasmacytoma not having achieved remission: Secondary | ICD-10-CM

## 2022-01-05 NOTE — Progress Notes (Signed)
Orders entered per NP for Lidocaine 2% injection as well as CBC, CMP

## 2022-01-08 ENCOUNTER — Inpatient Hospital Stay: Payer: Medicare Other | Attending: Oncology | Admitting: Adult Health

## 2022-01-08 ENCOUNTER — Other Ambulatory Visit: Payer: Self-pay

## 2022-01-08 ENCOUNTER — Inpatient Hospital Stay: Payer: Medicare Other

## 2022-01-08 ENCOUNTER — Ambulatory Visit: Payer: Medicare Other | Admitting: Oncology

## 2022-01-08 DIAGNOSIS — Z87891 Personal history of nicotine dependence: Secondary | ICD-10-CM | POA: Diagnosis not present

## 2022-01-08 DIAGNOSIS — C9030 Solitary plasmacytoma not having achieved remission: Secondary | ICD-10-CM | POA: Insufficient documentation

## 2022-01-08 DIAGNOSIS — J449 Chronic obstructive pulmonary disease, unspecified: Secondary | ICD-10-CM | POA: Diagnosis not present

## 2022-01-08 DIAGNOSIS — M549 Dorsalgia, unspecified: Secondary | ICD-10-CM | POA: Insufficient documentation

## 2022-01-08 LAB — CBC WITH DIFFERENTIAL (CANCER CENTER ONLY)
Abs Immature Granulocytes: 0.02 10*3/uL (ref 0.00–0.07)
Basophils Absolute: 0 10*3/uL (ref 0.0–0.1)
Basophils Relative: 1 %
Eosinophils Absolute: 0.2 10*3/uL (ref 0.0–0.5)
Eosinophils Relative: 3 %
HCT: 43.4 % (ref 39.0–52.0)
Hemoglobin: 15.3 g/dL (ref 13.0–17.0)
Immature Granulocytes: 0 %
Lymphocytes Relative: 35 %
Lymphs Abs: 1.8 10*3/uL (ref 0.7–4.0)
MCH: 30.8 pg (ref 26.0–34.0)
MCHC: 35.3 g/dL (ref 30.0–36.0)
MCV: 87.3 fL (ref 80.0–100.0)
Monocytes Absolute: 0.4 10*3/uL (ref 0.1–1.0)
Monocytes Relative: 8 %
Neutro Abs: 2.8 10*3/uL (ref 1.7–7.7)
Neutrophils Relative %: 53 %
Platelet Count: 170 10*3/uL (ref 150–400)
RBC: 4.97 MIL/uL (ref 4.22–5.81)
RDW: 11.9 % (ref 11.5–15.5)
WBC Count: 5.2 10*3/uL (ref 4.0–10.5)
nRBC: 0 % (ref 0.0–0.2)

## 2022-01-08 LAB — CMP (CANCER CENTER ONLY)
ALT: 26 U/L (ref 0–44)
AST: 19 U/L (ref 15–41)
Albumin: 4 g/dL (ref 3.5–5.0)
Alkaline Phosphatase: 141 U/L — ABNORMAL HIGH (ref 38–126)
Anion gap: 4 — ABNORMAL LOW (ref 5–15)
BUN: 17 mg/dL (ref 8–23)
CO2: 26 mmol/L (ref 22–32)
Calcium: 9.3 mg/dL (ref 8.9–10.3)
Chloride: 108 mmol/L (ref 98–111)
Creatinine: 0.99 mg/dL (ref 0.61–1.24)
GFR, Estimated: >60 mL/min (ref >60)
Glucose, Bld: 130 mg/dL — ABNORMAL HIGH (ref 70–99)
Potassium: 4.2 mmol/L (ref 3.5–5.1)
Sodium: 138 mmol/L (ref 135–145)
Total Bilirubin: 0.8 mg/dL (ref 0.3–1.2)
Total Protein: 6.6 g/dL (ref 6.5–8.1)

## 2022-01-08 MED ORDER — LIDOCAINE HCL 2 % IJ SOLN
20.0000 mL | Freq: Once | INTRAMUSCULAR | Status: DC
  Filled 2022-01-08: qty 20

## 2022-01-08 NOTE — Patient Instructions (Signed)
Bone Marrow Aspiration and Bone Marrow Biopsy, Adult, Care After This sheet gives you information about how to care for yourself after your procedure. Your health care provider may also give you more specific instructions. If you have problems or questions, contact your health care provider. What can I expect after the procedure? After the procedure, it is common to have: Mild pain and tenderness. Swelling. Bruising. Follow these instructions at home: Puncture site care  Follow instructions from your health care provider about how to take care of the puncture site. Make sure you: Wash your hands with soap and water before and after you change your bandage (dressing). If soap and water are not available, use hand sanitizer. Change your dressing as told by your health care provider. Check your puncture site every day for signs of infection. Check for: More redness, swelling, or pain. Fluid or blood. Warmth. Pus or a bad smell. Activity Return to your normal activities as told by your health care provider. Ask your health care provider what activities are safe for you. Do not lift anything that is heavier than 10 lb (4.5 kg), or the limit that you are told, until your health care provider says that it is safe. Do not drive for 24 hours if you were given a sedative during your procedure. General instructions  Take over-the-counter and prescription medicines only as told by your health care provider. Do not take baths, swim, or use a hot tub until your health care provider approves. Ask your health care provider if you may take showers. You may only be allowed to take sponge baths. If directed, put ice on the affected area. To do this: Put ice in a plastic bag. Place a towel between your skin and the bag. Leave the ice on for 20 minutes, 2-3 times a day. Keep all follow-up visits as told by your health care provider. This is important. Contact a health care provider if: Your pain is not  controlled with medicine. You have a fever. You have more redness, swelling, or pain around the puncture site. You have fluid or blood coming from the puncture site. Your puncture site feels warm to the touch. You have pus or a bad smell coming from the puncture site. Summary After the procedure, it is common to have mild pain, tenderness, swelling, and bruising. Follow instructions from your health care provider about how to take care of the puncture site and what activities are safe for you. Take over-the-counter and prescription medicines only as told by your health care provider. Contact a health care provider if you have any signs of infection, such as fluid or blood coming from the puncture site. This information is not intended to replace advice given to you by your health care provider. Make sure you discuss any questions you have with your health care provider. Document Revised: 09/12/2018 Document Reviewed: 09/12/2018 Elsevier Patient Education  [place].

## 2022-01-08 NOTE — Progress Notes (Signed)
INDICATION: plasmacytoma  Brief examination was performed. ENT: adequate airway clearance Heart: regular rate and rhythm.No Murmurs Lungs: clear to auscultation, no wheezes, normal respiratory effort  Bone Marrow Biopsy and Aspiration Procedure Note   Informed consent was obtained and potential risks including bleeding, infection and pain were reviewed with the patient.  The patient's name, date of birth, identification, consent and allergies were verified prior to the start of procedure and time out was performed.  The right posterior iliac crest was chosen as the site of biopsy.  The skin was prepped with ChloraPrep.   8 cc of 2% lidocaine was used to provide local anaesthesia.   10 cc of bone marrow aspirate was obtained followed by 0.8cm biopsy.  Pressure was applied to the biopsy site and bandage was placed over the biopsy site. Patient was made to lie on the back for 30 mins prior to discharge.  The procedure was tolerated well--he was very anxious and tense, I would recommend valium or conscious sedation with future bone marrow biopsies. COMPLICATIONS: None BLOOD LOSS: none The patient was discharged home in stable condition with a 1 week follow up to review results.  Patient was provided with post bone marrow biopsy instructions and instructed to call if there was any bleeding or worsening pain.  Specimens sent for flow cytometry, cytogenetics and additional studies.  Signed Scot Dock, NP

## 2022-01-12 ENCOUNTER — Inpatient Hospital Stay (HOSPITAL_BASED_OUTPATIENT_CLINIC_OR_DEPARTMENT_OTHER): Payer: Medicare Other | Admitting: Oncology

## 2022-01-12 VITALS — BP 100/71 | HR 73 | Temp 97.1°F | Resp 20 | Wt 193.8 lb

## 2022-01-12 DIAGNOSIS — C9030 Solitary plasmacytoma not having achieved remission: Secondary | ICD-10-CM

## 2022-01-12 DIAGNOSIS — J449 Chronic obstructive pulmonary disease, unspecified: Secondary | ICD-10-CM | POA: Diagnosis not present

## 2022-01-12 DIAGNOSIS — M549 Dorsalgia, unspecified: Secondary | ICD-10-CM | POA: Diagnosis not present

## 2022-01-12 DIAGNOSIS — Z87891 Personal history of nicotine dependence: Secondary | ICD-10-CM | POA: Diagnosis not present

## 2022-01-12 NOTE — Progress Notes (Signed)
  [facility] OFFICE PROGRESS NOTE   Diagnosis: Plasmacytoma  INTERVAL HISTORY:   Chad Flores returns as scheduled.  He reports improvement in back pain.  He has discontinued smoking.  No new complaint.  Objective:  Vital signs in last 24 hours:  Blood pressure 100/71, pulse 73, temperature (!) 97.1 F (36.2 C), temperature source Temporal, resp. rate 20, weight 193 lb 12.8 oz (87.9 kg), SpO2 96 %.    Lymphatics: No cervical, supraclavicular, axillary, or inguinal nodes Resp: Distant breath sounds, lungs clear bilaterally, no respiratory distress Cardio: Regular rate and rhythm GI: No hepatosplenomegaly Vascular: No leg edema  Skin: Right iliac bone marrow biopsy site without evidence of bleeding or infection   Lab Results:  Lab Results  Component Value Date   WBC 5.2 01/08/2022   HGB 15.3 01/08/2022   HCT 43.4 01/08/2022   MCV 87.3 01/08/2022   PLT 170 01/08/2022   NEUTROABS 2.8 01/08/2022    CMP  Lab Results  Component Value Date   NA 138 01/08/2022   K 4.2 01/08/2022   CL 108 01/08/2022   CO2 26 01/08/2022   GLUCOSE 130 (H) 01/08/2022   BUN 17 01/08/2022   CREATININE 0.99 01/08/2022   CALCIUM 9.3 01/08/2022   PROT 6.6 01/08/2022   ALBUMIN 4.0 01/08/2022   AST 19 01/08/2022   ALT 26 01/08/2022   ALKPHOS 141 (H) 01/08/2022   BILITOT 0.8 01/08/2022   GFRNONAA >60 01/08/2022   GFRAA >60 03/24/2017     Medications: I have reviewed the patient's current medications.   Assessment/Plan: L4 plasmacytoma MRI lumbar spine 11/25/2021-abnormal marrow signal at L4, moderate loss of inferior endplate height, mild enhancement at the left L5 pedicle and adjacent to left L4-5 facets favored to reflect arthropathy L4 biopsy 12/17/2021-plasmacytoma, kappa light chain restricted Bone survey 12/28/2021-L4 kyphoplasty, no other lytic or sclerotic lesion Normal immunoglobulin levels 12/28/2021 0.4 g serum M spike Serum immunofixation-monoclonal IgG kappa  protein Mild elevation of serum free kappa light chains, normal ratio Back pain secondary to #1 L4 kyphoplasty and ablation 12/17/2021 3.  Noninvasive urothelial carcinoma of the bladder-status post TURBT 11/26/2020 followed by induction and maintenance BCG therapy 4.  COPD 5.  History of tobacco use-followed in the lung cancer screening program    Disposition: Mr Flores was diagnosed with a pathologic L4 compression fracture.  Biopsy at L4 confirmed kappa light chain restricted plasma cells.  He has undergone a diagnostic evaluation for systemic multiple myeloma.  He appears to meet criteria for an isolated plasmacytoma of the bone.  There is a low level serum IgG kappa M spike and mild elevation of the serum free kappa light chains.  No other bone lesions were noted on the metastatic bone survey.  I discussed the diagnosis of a plasmacytoma of the bone with Mr. Chad Flores and his wife.  We discussed treatment options.  I recommend radiation.  He understands a significant percentage of patients with plasmacytoma of the bone can develop multiple myeloma over time.  I will make a referral to Dr. Tammi Klippel to conside radiation.  Mr Herbst will return for an office and lab visit in 3 months.  He will remain up-to-date on influenza, COVID 19, and pneumonia vaccines. Betsy Coder, MD  01/12/2022  4:13 PM

## 2022-01-13 LAB — SURGICAL PATHOLOGY

## 2022-01-14 ENCOUNTER — Encounter (HOSPITAL_COMMUNITY): Payer: Self-pay | Admitting: Oncology

## 2022-01-14 ENCOUNTER — Encounter: Payer: Self-pay | Admitting: *Deleted

## 2022-01-14 ENCOUNTER — Ambulatory Visit: Payer: Medicare Other | Admitting: Radiation Oncology

## 2022-01-14 ENCOUNTER — Ambulatory Visit: Payer: Medicare Other

## 2022-01-14 DIAGNOSIS — C9030 Solitary plasmacytoma not having achieved remission: Secondary | ICD-10-CM | POA: Diagnosis not present

## 2022-01-14 NOTE — Progress Notes (Signed)
Called pt with bone marrow final report, will see Dr Tammi Klippel tomorrow for consult and return to this clinic in 3 months or sooner if needed Pt verbalized understanding

## 2022-01-14 NOTE — Progress Notes (Signed)
Radiation Oncology         [phone] ________________________________  Initial outpatient Consultation  Name: Chad Flores MRN: [id number]  Date of Service: 01/15/2022 DOB: November 25, 1950  CC:London Pepper, MD  Ladell Pier, MD   REFERRING PHYSICIAN: Ladell Pier, MD  DIAGNOSIS: 71 yo gentleman with a solitary plasmacytoma in L4    ICD-10-CM   1. Solitary plasmacytoma not having achieved remission (HCC)  C90.30       HISTORY OF PRESENT ILLNESS: Chad Flores is a 71 y.o. male seen at the request of Dr. Benay Spice. He has a history of High Grade Ta urothelial carcinoma of the bladder in 11/2020 treated with TURBT followed by induction BCG x6 and maintenance BCG x3 in 05/2021 and again in June 2023. His follow up cystoscopies have  remained without evidence of disease recurrence and his next maintenance BCG treatment will begin 02/24/22.  Recently, he presented to the ED on 11/25/21 with acute worsening of his chronic low back pain after bending over to tie his shoes. A lumbar spine MRI on admission showed abnormal marrow signal at L4 concerning for a pathologic fracture with moderate loss of height at the inferior endplate. He proceeded to L4 biopsy, augmentation kyphoplasty, and radiofrequency ablation of L4 on 12/17/21.Final pathology revealed a plasmacytoma. He was referred to Dr. Benay Spice, who recommended metastatic bone survey and bone marrow biopsy to assess for systemic myeloma. The patient underwent bone survey on 12/28/21 showing no other definite lytic or sclerotic lesions and a bone marrow biopsy on 01/08/22 showed kappa restricted plasma cell neoplasm, involving less than 10% of the cellular marrow suggesting a solitary plasmacytoma with minimal bone marrow involvement.  He had had significant improvement in his back pain since the time of his kyphoplasty procedure but does still have some pain in the right buttock and groin. He has been referred to [name] today to discuss radiation  treatment options for the management of his disease.  PREVIOUS RADIATION THERAPY: No  PAST MEDICAL HISTORY:  Past Medical History:  Diagnosis Date   Abdominal aortic aneurysm (AAA) ([place])    followed by pcp---  per last ultrasound in epic 08-15-2019, proximal 3.3cm   Anxiety    Bladder mass    urologist-- dr winter   COPD (chronic obstructive pulmonary disease) ([place])    followed by pcp---  last exacerbation approx. 2019,  does not have rescue, controlled with daily advair   Diet-controlled diabetes mellitus ([place])    GERD (gastroesophageal reflux disease)    Osteoarthritis    Pulmonary nodules    followed by pcp---- last chest CT in epic 11-04-2020 bilateral tiny nodules stable   Urgency of urination       PAST SURGICAL HISTORY: Past Surgical History:  Procedure Laterality Date   CHOLECYSTECTOMY OPEN  1987   CYSTOSCOPY N/A 11/26/2020   Procedure: CYSTOSCOPY WITH EXAM UNDER ANESTHESIA/  TRANSURETHRAL RESECTION OF BLADDER TUMOR WITH GEMCITABINE INSTILLATION IN PACU , BILATERAL RETROGRADES,  RIGHT STENT PLACEMENT;  Surgeon: Ceasar Mons, MD;  Location: [facility];  Service: Urology;  Laterality: N/A;   ELBOW SURGERY Right 2008   IR BONE TUMOR(S)RF ABLATION  12/17/2021   IR KYPHO LUMBAR INC FX REDUCE BONE BX UNI/BIL CANNULATION INC/IMAGING  12/17/2021   KNEE ARTHROSCOPY W/ MENISCAL REPAIR Left    x3  last one 2012   TOTAL KNEE ARTHROPLASTY Left 03/22/2017   Procedure: LEFT TOTAL KNEE ARTHROPLASTY;  Surgeon: Garald Balding, MD;  Location: [place];  Service: Orthopedics;  Laterality: Left;   TRIGGER FINGER RELEASE Right 2007    FAMILY HISTORY:  Family History  Problem Relation Age of Onset   Alzheimer's disease Mother    Cancer Father     SOCIAL HISTORY:  Social History   Socioeconomic History   Marital status: Married    Spouse name: Not on file   Number of children: Not on file   Years of education: Not on file   Highest education level: Not  on file  Occupational History   Not on file  Tobacco Use   Smoking status: Every Day    Packs/day: 0.50    Years: 50.00    Total pack years: 25.00    Types: Cigarettes   Smokeless tobacco: Never  Vaping Use   Vaping Use: Former   Quit date: 12/12/2018   Devices: vaped, unsure name  Substance and Sexual Activity   Alcohol use: Yes    Alcohol/week: 1.0 standard drink of alcohol    Types: 1 Standard drinks or equivalent per week    Comment: occasional   Drug use: Never   Sexual activity: Not on file  Other Topics Concern   Not on file  Social History Narrative   Not on file   Social Determinants of Health   Financial Resource Strain: Not on file  Food Insecurity: Not on file  Transportation Needs: Not on file  Physical Activity: Not on file  Stress: Not on file  Social Connections: Not on file  Intimate Partner Violence: Not on file    ALLERGIES: Penicillin g  MEDICATIONS:  Current Outpatient Medications  Medication Sig Dispense Refill   acetaminophen (TYLENOL) 500 MG tablet Take 500 mg by mouth every 6 (six) hours as needed for mild pain or headache.     albuterol (VENTOLIN HFA) 108 (90 Base) MCG/ACT inhaler Inhale 2 puffs into the lungs every 6 (six) hours as needed for wheezing or shortness of breath.     ascorbic acid (VITAMIN C) 500 MG tablet Take 500 mg by mouth daily.     aspirin EC 81 MG tablet Take 81 mg by mouth daily.     atorvastatin (LIPITOR) 10 MG tablet Take 10 mg by mouth daily.  1   buPROPion (WELLBUTRIN XL) 150 MG 24 hr tablet Take 150 mg by mouth daily.  1   calcium carbonate (TUMS - DOSED IN MG ELEMENTAL CALCIUM) 500 MG chewable tablet Chew 1 tablet by mouth as needed for indigestion or heartburn.     cholecalciferol (VITAMIN D3) 25 MCG (1000 UNIT) tablet Take 1,000 Units by mouth daily with breakfast.     furosemide (LASIX) 20 MG tablet Take 20 mg by mouth daily as needed.     NONFORMULARY OR COMPOUNDED ITEM Callous/Corn Cream: Salicylic acid 94%,  Urea 76% Cream Order faxed to [place] (Patient taking differently: Apply 1 application  topically See admin instructions. Callous/Corn Cream: Salicylic acid 54%, Urea 65% Cream- Apply to affected corns once a day before bandaging as needed)     oxyCODONE-acetaminophen (PERCOCET) 5-325 MG tablet Take 1 tablet by mouth every 4 (four) hours as needed for severe pain. (Patient not taking: Reported on 01/12/2022) 20 tablet 0   tamsulosin (FLOMAX) 0.4 MG CAPS capsule Take 1 capsule (0.4 mg total) by mouth daily. (Patient taking differently: Take 0.4 mg by mouth in the morning and at bedtime.) 30 capsule 0   vitamin B-12 (CYANOCOBALAMIN) 1000 MCG tablet Take 1,000 mcg by mouth daily.  zinc gluconate 50 MG tablet Take 50 mg by mouth daily.     No current facility-administered medications for this encounter.    REVIEW OF SYSTEMS:  On review of systems, the patient reports that he is doing well overall. His back pain is much improved since his ablation/kyphoplasty procedure but he does have some persistent mild pain in the right buttock and groin. He does not have paraesthesias or focal weakness. He denies any chest pain, shortness of breath, cough, fevers, chills, night sweats, or unintended weight changes. He denies any bowel or bladder disturbances, and denies abdominal pain, nausea or vomiting. A complete review of systems is obtained and is otherwise negative.    PHYSICAL EXAM:  Wt Readings from Last 3 Encounters:  01/15/22 194 lb 6.4 oz (88.2 kg)  01/15/22 194 lb 4 oz (88.1 kg)  01/12/22 193 lb 12.8 oz (87.9 kg)   Temp Readings from Last 3 Encounters:  01/15/22 98 F (36.7 C) (Oral)  01/15/22 98 F (36.7 C) (Oral)  01/12/22 (!) 97.1 F (36.2 C) (Temporal)   BP Readings from Last 3 Encounters:  01/15/22 112/67  01/15/22 112/67  01/12/22 100/71   Pulse Readings from Last 3 Encounters:  01/15/22 81  01/15/22 81  01/12/22 73   Pain Assessment Pain Score: 3  Pain Loc: Hip  (right hip)/10  In general this is a well appearing Caucasian male in no acute distress. He's alert and oriented x4 and appropriate throughout the examination. Cardiopulmonary assessment is negative for acute distress and he exhibits normal effort.     KPS = 90  100 - Normal; no complaints; no evidence of disease. 90   - Able to carry on normal activity; minor signs or symptoms of disease. 80   - Normal activity with effort; some signs or symptoms of disease. 19   - Cares for self; unable to carry on normal activity or to do active work. 60   - Requires occasional assistance, but is able to care for most of his personal needs. 50   - Requires considerable assistance and frequent medical care. 75   - Disabled; requires special care and assistance. 49   - Severely disabled; hospital admission is indicated although death not imminent. 12   - Very sick; hospital admission necessary; active supportive treatment necessary. 10   - Moribund; fatal processes progressing rapidly. 0     - Dead  Karnofsky DA, Abelmann Paxtang, Craver LS and Burchenal JH [phone]) The use of the nitrogen mustards in the palliative treatment of carcinoma: with particular reference to bronchogenic carcinoma Cancer 1 [id number]  LABORATORY DATA:  Lab Results  Component Value Date   WBC 5.2 01/08/2022   HGB 15.3 01/08/2022   HCT 43.4 01/08/2022   MCV 87.3 01/08/2022   PLT 170 01/08/2022   Lab Results  Component Value Date   NA 138 01/08/2022   K 4.2 01/08/2022   CL 108 01/08/2022   CO2 26 01/08/2022   Lab Results  Component Value Date   ALT 26 01/08/2022   AST 19 01/08/2022   ALKPHOS 141 (H) 01/08/2022   BILITOT 0.8 01/08/2022     RADIOGRAPHY: DG Bone Survey Met  Result Date: 12/30/2021 CLINICAL DATA:  L4 plasmacytoma. EXAM: METASTATIC BONE SURVEY COMPARISON:  MRI of November 25, 2021. FINDINGS: Status post left total knee arthroplasty. Status post L4 kyphoplasty. No definite lytic or sclerotic lesion is noted in the  skull, spine, rib cage, pelvis or extremities. IMPRESSION: Status post L4 kyphoplasty.  No other definite lytic or sclerotic lesion seen in the skeleton. Electronically Signed   By: Marijo Conception M.D.   On: 12/30/2021 11:49   US Venous Img Lower Unilateral Left (DVT)  Result Date: 12/22/2021 CLINICAL DATA:  Lower extremity swelling, rule out DVT EXAM: LEFT LOWER EXTREMITY VENOUS DOPPLER ULTRASOUND TECHNIQUE: Gray-scale sonography with compression, as well as color and duplex ultrasound, were performed to evaluate the deep venous system(s) from the level of the common femoral vein through the popliteal and proximal calf veins. COMPARISON:  None Available. FINDINGS: VENOUS Normal compressibility of the common femoral, superficial femoral, and popliteal veins, as well as the visualized calf veins. Visualized portions of profunda femoral vein and great saphenous vein unremarkable. No filling defects to suggest DVT on grayscale or color Doppler imaging. Doppler waveforms show normal direction of venous flow, normal respiratory plasticity and response to augmentation. Limited views of the contralateral common femoral vein are unremarkable. OTHER None. Limitations: none IMPRESSION: Negative examination for deep venous thrombosis in the left lower extremity. Electronically Signed   By: Delanna Ahmadi M.D.   On: 12/22/2021 14:58   IR KYPHO LUMBAR INC FX REDUCE BONE BX UNI/BIL CANNULATION INC/IMAGING  Result Date: 12/18/2021 INDICATION: New onset severe low back pain secondary to pathologic compression fracture at L4. EXAM: RADIOFREQUENCY ABLATION AT L4 FOLLOWED BY BALLOON KYPHOPLASTY COMPARISON:  None Available. MEDICATIONS: As antibiotic prophylaxis, vancomycin 1 g IV was ordered pre-procedure and administered intravenously within 1 hour of incision. All current medications are in the EMR and have been reviewed as part of this encounter. ANESTHESIA/SEDATION: Moderate (conscious) sedation was employed during this  procedure. A total of Versed 3 mg and Fentanyl 100 mcg and Dilaudid 1.5 mg IV was administered intravenously by the radiology nurse. Total intra-service moderate Sedation Time: 60 minutes. The patient's level of consciousness and vital signs were monitored continuously by radiology nursing throughout the procedure under my direct supervision. FLUOROSCOPY: Radiation Exposure Index (as provided by the fluoroscopic device): 16 15 mGy Kerma COMPLICATIONS: None immediate. PROCEDURE: Following a full explanation of the procedure along with the potential associated complications, an informed witnessed consent was obtained. The patient was placed prone on the fluoroscopic table. The skin overlying the lumbosacral region was then prepped and draped in the usual sterile fashion. Both pedicles at L4 were then infiltrated with 0.25% bupivacaine followed by the advancement of an 11-gauge Jamshidi needle through both the pedicles into the posterior one-third at L4. These were then exchanged for a Kyphon advanced osteo introducer system comprised of a working cannula and a Kyphon osteo drill through each pedicle. Combinations were then advanced over a Kyphon osteo bone pin until the tips of the Kyphon osteo drills were in the posterior third at L4. At this time, the bone pins were removed. In a medial trajectory, the combinations were advanced until the tips of the working cannulae was inside the posterior one-third at L4. The osteo drills were removed and core samples were sent for pathologic analysis. Through each working cannula, a Kyphon bone biopsy device was advanced to within 5 mm of the anterior aspect of L4. A core sample from this was also sent for pathologic analysis. The ablation working length drill was then advanced in order to gauge the ablation probes. This was 15 cm. The radiofrequency ablation probes were then advanced through the working cannula. Ablation was then performed for approximately 15 minutes. These  were removed. Through the working cannulae, a Kyphon inflatable bone tamp 20 x 3  were advanced and positioned with the distal marker 5 mm from the anterior aspect of L4. Crossing of the midline was seen on the AP projection. At this time, the balloons were expanded using contrast via a Kyphon inflation syringe device via microtubing. Inflations were continued until there was apposition with the superior endplate. At this time, methylmethacrylate mixture was reconstituted with Tobramycin in the Kyphon bone mixing device system. This was then loaded onto the Kyphon bone fillers. The balloons were deflated and removed followed by the instillation of 6 bone filler equivalents of methylmethacrylate mixture at L4 with excellent filling in the AP and lateral projections. Slight extravasation was noted through the inferior endplate fracture cleft. No extension was noted posteriorly into the spinal canal. No epidural venous contamination was seen. The working cannulae and the bone fillers were then retrieved and removed. Hemostasis was achieved at the skin entry sites. Patient tolerated the procedure well. Patient was then transferred to short-stay for recovery. IMPRESSION: 1. Status post fluoroscopic-guided needle placement for deep core bone biopsy at L4. 2. Status post vertebral body augmentation using balloon kyphoplasty at L4 as described without event. 3. Radiofrequency ablation of pathologic compression fracture at L4 without event. If the patient has known osteoporosis, recommend treatment as clinically indicated. If the patient's bone density status is unknown, DEXA scan is recommended. Electronically Signed   By: Luanne Bras M.D.   On: 12/18/2021 07:47   IR Bone Tumor(s)RF Ablation  Result Date: 12/18/2021 INDICATION: New onset severe low back pain secondary to pathologic compression fracture at L4. EXAM: RADIOFREQUENCY ABLATION AT L4 FOLLOWED BY BALLOON KYPHOPLASTY COMPARISON:  None Available.  MEDICATIONS: As antibiotic prophylaxis, vancomycin 1 g IV was ordered pre-procedure and administered intravenously within 1 hour of incision. All current medications are in the EMR and have been reviewed as part of this encounter. ANESTHESIA/SEDATION: Moderate (conscious) sedation was employed during this procedure. A total of Versed 3 mg and Fentanyl 100 mcg and Dilaudid 1.5 mg IV was administered intravenously by the radiology nurse. Total intra-service moderate Sedation Time: 60 minutes. The patient's level of consciousness and vital signs were monitored continuously by radiology nursing throughout the procedure under my direct supervision. FLUOROSCOPY: Radiation Exposure Index (as provided by the fluoroscopic device): 16 15 mGy Kerma COMPLICATIONS: None immediate. PROCEDURE: Following a full explanation of the procedure along with the potential associated complications, an informed witnessed consent was obtained. The patient was placed prone on the fluoroscopic table. The skin overlying the lumbosacral region was then prepped and draped in the usual sterile fashion. Both pedicles at L4 were then infiltrated with 0.25% bupivacaine followed by the advancement of an 11-gauge Jamshidi needle through both the pedicles into the posterior one-third at L4. These were then exchanged for a Kyphon advanced osteo introducer system comprised of a working cannula and a Kyphon osteo drill through each pedicle. Combinations were then advanced over a Kyphon osteo bone pin until the tips of the Kyphon osteo drills were in the posterior third at L4. At this time, the bone pins were removed. In a medial trajectory, the combinations were advanced until the tips of the working cannulae was inside the posterior one-third at L4. The osteo drills were removed and core samples were sent for pathologic analysis. Through each working cannula, a Kyphon bone biopsy device was advanced to within 5 mm of the anterior aspect of L4. A core  sample from this was also sent for pathologic analysis. The ablation working length drill was then advanced in  order to gauge the ablation probes. This was 15 cm. The radiofrequency ablation probes were then advanced through the working cannula. Ablation was then performed for approximately 15 minutes. These were removed. Through the working cannulae, a Kyphon inflatable bone tamp 20 x 3 were advanced and positioned with the distal marker 5 mm from the anterior aspect of L4. Crossing of the midline was seen on the AP projection. At this time, the balloons were expanded using contrast via a Kyphon inflation syringe device via microtubing. Inflations were continued until there was apposition with the superior endplate. At this time, methylmethacrylate mixture was reconstituted with Tobramycin in the Kyphon bone mixing device system. This was then loaded onto the Kyphon bone fillers. The balloons were deflated and removed followed by the instillation of 6 bone filler equivalents of methylmethacrylate mixture at L4 with excellent filling in the AP and lateral projections. Slight extravasation was noted through the inferior endplate fracture cleft. No extension was noted posteriorly into the spinal canal. No epidural venous contamination was seen. The working cannulae and the bone fillers were then retrieved and removed. Hemostasis was achieved at the skin entry sites. Patient tolerated the procedure well. Patient was then transferred to short-stay for recovery. IMPRESSION: 1. Status post fluoroscopic-guided needle placement for deep core bone biopsy at L4. 2. Status post vertebral body augmentation using balloon kyphoplasty at L4 as described without event. 3. Radiofrequency ablation of pathologic compression fracture at L4 without event. If the patient has known osteoporosis, recommend treatment as clinically indicated. If the patient's bone density status is unknown, DEXA scan is recommended. Electronically Signed    By: Luanne Bras M.D.   On: 12/18/2021 07:47      IMPRESSION/PLAN: 1. 71 y.o. gentleman with a solitary plasmacytoma in L4  Today, we talked to the patient and his wife about the findings and workup thus far. We discussed the natural history of plasmacytoma and general treatment, highlighting the role of radiotherapy in the management. We discussed the available radiation techniques, and focused on the details and logistics of delivery. Since this appears to be a solitary plasmacytoma, the recommendation is for a 4 week course of daily external beam radiation to the L4 lesion. We reviewed the anticipated acute and late sequelae associated with radiation in this setting. The patient was encouraged to ask questions that were answered to his stated satisfaction.  At the end of our conversation, the patient is interested in proceeding with the recommended 4 week course of daily external beam radiation and would like to start treatment as soon as possible in an effort to complete treatment prior to a scheduled vacation coming up in October (02/13/22 - 02/20/22). He appears to have a good understanding of his disease and our treatment recommendations which are of curative intent. He has freely signed written consent to proceed today in the office and a copy of this document will be placed in his medical record. We will share our discussion with Dr. Benay Spice and proceed with treatment planning accordingly. He is scheduled for CT SIM/treatment planning following our visit today, in anticipation of beginning his daily treatments on Monday, 01/18/22. We enjoyed meeting him and his wife today and look forward to continuing to participate in his care.  We personally spent 60 minutes in this encounter including chart review, reviewing radiological studies, meeting face-to-face with the patient, entering orders and completing documentation.    Nicholos Johns, PA-C    Tyler Pita, MD  [facility] Direct Dial: [phone]  Fax: [phone] [email or web address]  Skype  [email or web address]   This document serves as a record of services personally performed by Tyler Pita, MD and Freeman Caldron, PA-C. It was created on their behalf by Wilburn Mylar, a trained medical scribe. The creation of this record is based on the scribe's personal observations and the provider's statements to them. This document has been checked and approved by the attending provider.

## 2022-01-15 ENCOUNTER — Other Ambulatory Visit: Payer: Self-pay

## 2022-01-15 ENCOUNTER — Ambulatory Visit: Payer: Medicare Other | Admitting: Radiation Oncology

## 2022-01-15 ENCOUNTER — Ambulatory Visit
Admission: RE | Admit: 2022-01-15 | Discharge: 2022-01-15 | Disposition: A | Discharge status: Home / Self Care | Payer: Medicare Other | Source: Ambulatory Visit | Attending: Radiation Oncology | Admitting: Radiation Oncology

## 2022-01-15 ENCOUNTER — Ambulatory Visit: Payer: Medicare Other

## 2022-01-15 VITALS — BP 112/67 | HR 81 | Temp 98.0°F | Resp 18 | Ht 70.0 in | Wt 194.2 lb

## 2022-01-15 VITALS — BP 112/67 | HR 81 | Temp 98.0°F | Resp 18 | Ht 70.0 in | Wt 194.4 lb

## 2022-01-15 DIAGNOSIS — I714 Abdominal aortic aneurysm, without rupture, unspecified: Secondary | ICD-10-CM | POA: Insufficient documentation

## 2022-01-15 DIAGNOSIS — F17210 Nicotine dependence, cigarettes, uncomplicated: Secondary | ICD-10-CM | POA: Insufficient documentation

## 2022-01-15 DIAGNOSIS — M810 Age-related osteoporosis without current pathological fracture: Secondary | ICD-10-CM | POA: Insufficient documentation

## 2022-01-15 DIAGNOSIS — K219 Gastro-esophageal reflux disease without esophagitis: Secondary | ICD-10-CM | POA: Insufficient documentation

## 2022-01-15 DIAGNOSIS — R918 Other nonspecific abnormal finding of lung field: Secondary | ICD-10-CM | POA: Insufficient documentation

## 2022-01-15 DIAGNOSIS — C9030 Solitary plasmacytoma not having achieved remission: Secondary | ICD-10-CM | POA: Insufficient documentation

## 2022-01-15 DIAGNOSIS — Z809 Family history of malignant neoplasm, unspecified: Secondary | ICD-10-CM | POA: Diagnosis not present

## 2022-01-15 DIAGNOSIS — Z7982 Long term (current) use of aspirin: Secondary | ICD-10-CM | POA: Insufficient documentation

## 2022-01-15 DIAGNOSIS — M1990 Unspecified osteoarthritis, unspecified site: Secondary | ICD-10-CM | POA: Diagnosis not present

## 2022-01-15 DIAGNOSIS — Z510 Encounter for antineoplastic radiation therapy: Secondary | ICD-10-CM | POA: Insufficient documentation

## 2022-01-15 DIAGNOSIS — Z79899 Other long term (current) drug therapy: Secondary | ICD-10-CM | POA: Insufficient documentation

## 2022-01-15 DIAGNOSIS — R3915 Urgency of urination: Secondary | ICD-10-CM | POA: Diagnosis not present

## 2022-01-15 DIAGNOSIS — J449 Chronic obstructive pulmonary disease, unspecified: Secondary | ICD-10-CM | POA: Insufficient documentation

## 2022-01-15 NOTE — Progress Notes (Signed)
Histology and Location of Primary Cancer: Lumbar Spine  Biopsy: Positive for Plasmacytoma   Past Dx:  Urothelial Cancer 11/25/2021  Sites of Visceral and Bony Metastatic Disease: L4  Location(s) of Symptomatic Metastases: L4  12/28/2021 Dr. Christia Reading Bone Survey Met CLINICAL DATA:  L4 plasmacytoma   FINDINGS: Status post left total knee arthroplasty. Status post L4 kyphoplasty. No definite lytic or sclerotic lesion is noted in the skull, spine, rib cage, pelvis or extremities.   IMPRESSION: Status post L4 kyphoplasty. No other definite lytic or sclerotic lesion seen in the skeleton.   12/17/2021 Dr. Estanislado Pandy IR Kypho Lumbar INC FX Reduce Bone BX UNI/BIL Cannulation INC/Imaging   INDICATION: New onset severe low back pain secondary to pathologic compression fracture at L4.   IMPRESSION: 1. Status post fluoroscopic-guided needle placement for deep core bone biopsy at L4. 2. Status post vertebral body augmentation using balloon kyphoplasty at L4 as described without event. 3. Radiofrequency ablation of pathologic compression fracture at L4 without event.   11/25/2021 Dr. Vanita Panda MR Lumbar Spine with/without Contrast CLINICAL DATA:  Low back pain, cauda equina syndrome suspected   IMPRESSION: Abnormal marrow signal at L4 suspicious for metastatic disease. No extraosseous extension. Minor loss of height at the inferior endplate may be pathologic.  Mild enhancement at the left L5 pedicle and adjacent to left L4-L5 facets is favored to reflect facet arthropathy.   Past/Anticipated chemotherapy by medical oncology, if any:    Dr. Benay Spice Assessment/Plan: L4 plasmacytoma MRI lumbar spine 11/25/2021-abnormal marrow signal at L4, moderate loss of inferior endplate height, mild enhancement at the left L5 pedicle and adjacent to left L4-5 facets favored to reflect arthropathy L4 biopsy 12/17/2021-plasmacytoma, kappa light chain restricted Bone survey 12/28/2021-L4 kyphoplasty, no  other lytic or sclerotic lesion Normal immunoglobulin levels 12/28/2021 0.4 g serum M spike Serum immunofixation-monoclonal IgG kappa protein Mild elevation of serum free kappa light chains, normal ratio Back pain secondary to #1       L4 kyphoplasty and ablation 12/17/2021 3.  Noninvasive urothelial carcinoma of the bladder-status post TURBT 11/26/2020 followed by induction and maintenance BCG therapy. 4.  COPD 5.  History of tobacco use-followed in the lung cancer screening program.   Disposition: Mr Paskett was diagnosed with a pathologic L4 compression fracture.  Biopsy at L4 confirmed kappa light chain restricted plasma cells.  He has undergone a diagnostic evaluation for systemic multiple myeloma.  He appears to meet criteria for an isolated plasmacytoma of the bone.  There is a low level serum IgG kappa M spike and mild elevation of the serum free kappa light chains.  No other bone lesions were noted on the metastatic bone survey.   I discussed the diagnosis of a plasmacytoma of the bone with Mr. Fedor and his wife.  We discussed treatment options.  I recommend radiation.  He understands a significant percentage of patients with plasmacytoma of the bone can develop multiple myeloma over time.   I will make a referral to Dr. Tammi Klippel to conside radiation.  Mr Venne will return for an office and lab visit in 3 months.  He will remain up-to-date on influenza, COVID 19, and pneumonia vaccines.   Pain on a scale of 0-10 is:  3/10 right hip   If Spine Met(s), symptoms, if any, include: Bowel/Bladder retention or incontinence (please describe): No Numbness or weakness in extremities (please describe): No Current Decadron regimen, if applicable: No  Ambulatory status? Walker? Wheelchair?: Independent  SAFETY ISSUES: Prior radiation?  No Pacemaker/ICD? No  Possible current pregnancy? Male Is the patient on methotrexate? No  Current Complaints / other details:  Need more information  on treatment options.

## 2022-01-15 NOTE — Progress Notes (Signed)
  Radiation Oncology         [phone] ________________________________  Name: JAVANNI MARING MRN: [id number]  Date: 01/15/2022  DOB: 23-Nov-1950  SIMULATION AND TREATMENT PLANNING NOTE    ICD-10-CM   1. Solitary plasmacytoma not having achieved remission (HCC)  C90.30       DIAGNOSIS:  71 y.o. patient with lumbar L4 solitary plasmacytoma  NARRATIVE:  The patient was brought to the [facility].  Identity was confirmed.  All relevant records and images related to the planned course of therapy were reviewed.  The patient freely provided informed written consent to proceed with treatment after reviewing the details related to the planned course of therapy. The consent form was witnessed and verified by the simulation staff.  Then, the patient was set-up in a stable reproducible  supine position for radiation therapy.  CT images were obtained.  Surface markings were placed.  The CT images were loaded into the planning software.  Then the target and avoidance structures were contoured including kidneys.  Treatment planning then occurred.  The radiation prescription was entered and confirmed.  Then, I designed and supervised the construction of a total of 3 medically necessary complex treatment devices with VacLoc positioner and 2 MLCs to shield kidneys.  I have requested : 3D Simulation  I have requested a DVH of the following structures: Left Kidney, Right Kidney and target.  PLAN:  The patient will receive 42 Gy in 20 fractions.  ________________________________  Sheral Apley Tammi Klippel, M.D.

## 2022-01-18 ENCOUNTER — Ambulatory Visit
Admission: RE | Admit: 2022-01-18 | Discharge: 2022-01-18 | Disposition: A | Discharge status: Home / Self Care | Payer: Medicare Other | Source: Ambulatory Visit | Attending: Radiation Oncology | Admitting: Radiation Oncology

## 2022-01-18 ENCOUNTER — Other Ambulatory Visit: Payer: Self-pay

## 2022-01-18 ENCOUNTER — Encounter (HOSPITAL_COMMUNITY): Payer: Self-pay | Admitting: Oncology

## 2022-01-18 DIAGNOSIS — Z510 Encounter for antineoplastic radiation therapy: Secondary | ICD-10-CM | POA: Diagnosis not present

## 2022-01-18 DIAGNOSIS — C9030 Solitary plasmacytoma not having achieved remission: Secondary | ICD-10-CM | POA: Diagnosis not present

## 2022-01-18 LAB — RAD ONC ARIA SESSION SUMMARY
Course Elapsed Days: 0
Plan Fractions Treated to Date: 1
Plan Prescribed Dose Per Fraction: 2.1 Gy
Plan Total Fractions Prescribed: 20
Plan Total Prescribed Dose: 42 Gy
Reference Point Dosage Given to Date: 2.1 Gy
Reference Point Session Dosage Given: 2.1 Gy
Session Number: 1

## 2022-01-19 ENCOUNTER — Other Ambulatory Visit: Payer: Self-pay

## 2022-01-19 ENCOUNTER — Ambulatory Visit
Admission: RE | Admit: 2022-01-19 | Discharge: 2022-01-19 | Disposition: A | Discharge status: Home / Self Care | Payer: Medicare Other | Source: Ambulatory Visit | Attending: Radiation Oncology | Admitting: Radiation Oncology

## 2022-01-19 DIAGNOSIS — Z510 Encounter for antineoplastic radiation therapy: Secondary | ICD-10-CM | POA: Diagnosis not present

## 2022-01-19 DIAGNOSIS — C9030 Solitary plasmacytoma not having achieved remission: Secondary | ICD-10-CM | POA: Diagnosis not present

## 2022-01-19 LAB — RAD ONC ARIA SESSION SUMMARY
Course Elapsed Days: 1
Plan Fractions Treated to Date: 2
Plan Prescribed Dose Per Fraction: 2.1 Gy
Plan Total Fractions Prescribed: 20
Plan Total Prescribed Dose: 42 Gy
Reference Point Dosage Given to Date: 4.2 Gy
Reference Point Session Dosage Given: 2.1 Gy
Session Number: 2

## 2022-01-20 ENCOUNTER — Other Ambulatory Visit: Payer: Self-pay

## 2022-01-20 ENCOUNTER — Ambulatory Visit
Admission: RE | Admit: 2022-01-20 | Discharge: 2022-01-20 | Disposition: A | Discharge status: Home / Self Care | Payer: Medicare Other | Source: Ambulatory Visit | Attending: Radiation Oncology | Admitting: Radiation Oncology

## 2022-01-20 DIAGNOSIS — Z510 Encounter for antineoplastic radiation therapy: Secondary | ICD-10-CM | POA: Diagnosis not present

## 2022-01-20 DIAGNOSIS — C9030 Solitary plasmacytoma not having achieved remission: Secondary | ICD-10-CM | POA: Diagnosis not present

## 2022-01-20 LAB — RAD ONC ARIA SESSION SUMMARY
Course Elapsed Days: 2
Plan Fractions Treated to Date: 3
Plan Prescribed Dose Per Fraction: 2.1 Gy
Plan Total Fractions Prescribed: 20
Plan Total Prescribed Dose: 42 Gy
Reference Point Dosage Given to Date: 6.3 Gy
Reference Point Session Dosage Given: 2.1 Gy
Session Number: 3

## 2022-01-21 ENCOUNTER — Other Ambulatory Visit: Payer: Self-pay

## 2022-01-21 ENCOUNTER — Ambulatory Visit
Admission: RE | Admit: 2022-01-21 | Discharge: 2022-01-21 | Disposition: A | Discharge status: Home / Self Care | Payer: Medicare Other | Source: Ambulatory Visit | Attending: Radiation Oncology | Admitting: Radiation Oncology

## 2022-01-21 DIAGNOSIS — C9030 Solitary plasmacytoma not having achieved remission: Secondary | ICD-10-CM | POA: Diagnosis not present

## 2022-01-21 DIAGNOSIS — Z510 Encounter for antineoplastic radiation therapy: Secondary | ICD-10-CM | POA: Diagnosis not present

## 2022-01-21 LAB — RAD ONC ARIA SESSION SUMMARY
Course Elapsed Days: 3
Plan Fractions Treated to Date: 4
Plan Prescribed Dose Per Fraction: 2.1 Gy
Plan Total Fractions Prescribed: 20
Plan Total Prescribed Dose: 42 Gy
Reference Point Dosage Given to Date: 8.4 Gy
Reference Point Session Dosage Given: 2.1 Gy
Session Number: 4

## 2022-01-22 ENCOUNTER — Ambulatory Visit
Admission: RE | Admit: 2022-01-22 | Discharge: 2022-01-22 | Disposition: A | Discharge status: Home / Self Care | Payer: Medicare Other | Source: Ambulatory Visit | Attending: Radiation Oncology | Admitting: Radiation Oncology

## 2022-01-22 ENCOUNTER — Other Ambulatory Visit: Payer: Self-pay

## 2022-01-22 DIAGNOSIS — Z510 Encounter for antineoplastic radiation therapy: Secondary | ICD-10-CM | POA: Diagnosis not present

## 2022-01-22 DIAGNOSIS — C9030 Solitary plasmacytoma not having achieved remission: Secondary | ICD-10-CM | POA: Diagnosis not present

## 2022-01-22 LAB — RAD ONC ARIA SESSION SUMMARY
Course Elapsed Days: 4
Plan Fractions Treated to Date: 5
Plan Prescribed Dose Per Fraction: 2.1 Gy
Plan Total Fractions Prescribed: 20
Plan Total Prescribed Dose: 42 Gy
Reference Point Dosage Given to Date: 10.5 Gy
Reference Point Session Dosage Given: 2.1 Gy
Session Number: 5

## 2022-01-25 ENCOUNTER — Other Ambulatory Visit: Payer: Self-pay

## 2022-01-25 ENCOUNTER — Ambulatory Visit
Admission: RE | Admit: 2022-01-25 | Discharge: 2022-01-25 | Disposition: A | Discharge status: Home / Self Care | Payer: Medicare Other | Source: Ambulatory Visit | Attending: Radiation Oncology | Admitting: Radiation Oncology

## 2022-01-25 DIAGNOSIS — C9030 Solitary plasmacytoma not having achieved remission: Secondary | ICD-10-CM | POA: Diagnosis not present

## 2022-01-25 DIAGNOSIS — Z510 Encounter for antineoplastic radiation therapy: Secondary | ICD-10-CM | POA: Diagnosis not present

## 2022-01-25 LAB — RAD ONC ARIA SESSION SUMMARY
Course Elapsed Days: 7
Plan Fractions Treated to Date: 6
Plan Prescribed Dose Per Fraction: 2.1 Gy
Plan Total Fractions Prescribed: 20
Plan Total Prescribed Dose: 42 Gy
Reference Point Dosage Given to Date: 12.6 Gy
Reference Point Session Dosage Given: 2.1 Gy
Session Number: 6

## 2022-01-26 ENCOUNTER — Ambulatory Visit
Admission: RE | Admit: 2022-01-26 | Discharge: 2022-01-26 | Disposition: A | Discharge status: Home / Self Care | Payer: Medicare Other | Source: Ambulatory Visit | Attending: Radiation Oncology | Admitting: Radiation Oncology

## 2022-01-26 ENCOUNTER — Other Ambulatory Visit: Payer: Self-pay

## 2022-01-26 DIAGNOSIS — C9030 Solitary plasmacytoma not having achieved remission: Secondary | ICD-10-CM | POA: Diagnosis not present

## 2022-01-26 DIAGNOSIS — Z510 Encounter for antineoplastic radiation therapy: Secondary | ICD-10-CM | POA: Diagnosis not present

## 2022-01-26 LAB — RAD ONC ARIA SESSION SUMMARY
Course Elapsed Days: 8
Plan Fractions Treated to Date: 7
Plan Prescribed Dose Per Fraction: 2.1 Gy
Plan Total Fractions Prescribed: 20
Plan Total Prescribed Dose: 42 Gy
Reference Point Dosage Given to Date: 14.7 Gy
Reference Point Session Dosage Given: 2.1 Gy
Session Number: 7

## 2022-01-27 ENCOUNTER — Other Ambulatory Visit: Payer: Self-pay

## 2022-01-27 ENCOUNTER — Ambulatory Visit
Admission: RE | Admit: 2022-01-27 | Discharge: 2022-01-27 | Disposition: A | Discharge status: Home / Self Care | Payer: Medicare Other | Source: Ambulatory Visit | Attending: Radiation Oncology | Admitting: Radiation Oncology

## 2022-01-27 DIAGNOSIS — C9030 Solitary plasmacytoma not having achieved remission: Secondary | ICD-10-CM | POA: Diagnosis not present

## 2022-01-27 DIAGNOSIS — Z510 Encounter for antineoplastic radiation therapy: Secondary | ICD-10-CM | POA: Diagnosis not present

## 2022-01-27 LAB — RAD ONC ARIA SESSION SUMMARY
Course Elapsed Days: 9
Plan Fractions Treated to Date: 8
Plan Prescribed Dose Per Fraction: 2.1 Gy
Plan Total Fractions Prescribed: 20
Plan Total Prescribed Dose: 42 Gy
Reference Point Dosage Given to Date: 16.8 Gy
Reference Point Session Dosage Given: 2.1 Gy
Session Number: 8

## 2022-01-28 ENCOUNTER — Ambulatory Visit
Admission: RE | Admit: 2022-01-28 | Discharge: 2022-01-28 | Disposition: A | Discharge status: Home / Self Care | Payer: Medicare Other | Source: Ambulatory Visit | Attending: Radiation Oncology | Admitting: Radiation Oncology

## 2022-01-28 ENCOUNTER — Other Ambulatory Visit: Payer: Self-pay

## 2022-01-28 DIAGNOSIS — Z510 Encounter for antineoplastic radiation therapy: Secondary | ICD-10-CM | POA: Diagnosis not present

## 2022-01-28 DIAGNOSIS — C9030 Solitary plasmacytoma not having achieved remission: Secondary | ICD-10-CM | POA: Diagnosis not present

## 2022-01-28 LAB — RAD ONC ARIA SESSION SUMMARY
Course Elapsed Days: 10
Plan Fractions Treated to Date: 9
Plan Prescribed Dose Per Fraction: 2.1 Gy
Plan Total Fractions Prescribed: 20
Plan Total Prescribed Dose: 42 Gy
Reference Point Dosage Given to Date: 18.9 Gy
Reference Point Session Dosage Given: 2.1 Gy
Session Number: 9

## 2022-01-29 ENCOUNTER — Ambulatory Visit
Admission: RE | Admit: 2022-01-29 | Discharge: 2022-01-29 | Disposition: A | Discharge status: Home / Self Care | Payer: Medicare Other | Source: Ambulatory Visit | Attending: Radiation Oncology | Admitting: Radiation Oncology

## 2022-01-29 ENCOUNTER — Other Ambulatory Visit: Payer: Self-pay

## 2022-01-29 ENCOUNTER — Ambulatory Visit: Payer: Medicare Other

## 2022-01-29 DIAGNOSIS — C9030 Solitary plasmacytoma not having achieved remission: Secondary | ICD-10-CM | POA: Diagnosis not present

## 2022-01-29 DIAGNOSIS — Z510 Encounter for antineoplastic radiation therapy: Secondary | ICD-10-CM | POA: Diagnosis not present

## 2022-01-29 LAB — RAD ONC ARIA SESSION SUMMARY
Course Elapsed Days: 11
Plan Fractions Treated to Date: 10
Plan Prescribed Dose Per Fraction: 2.1 Gy
Plan Total Fractions Prescribed: 20
Plan Total Prescribed Dose: 42 Gy
Reference Point Dosage Given to Date: 21 Gy
Reference Point Session Dosage Given: 2.1 Gy
Session Number: 10

## 2022-02-01 ENCOUNTER — Other Ambulatory Visit: Payer: Self-pay

## 2022-02-01 ENCOUNTER — Ambulatory Visit
Admission: RE | Admit: 2022-02-01 | Discharge: 2022-02-01 | Disposition: A | Discharge status: Home / Self Care | Payer: Medicare Other | Source: Ambulatory Visit | Attending: Radiation Oncology | Admitting: Radiation Oncology

## 2022-02-01 ENCOUNTER — Ambulatory Visit: Payer: Medicare Other

## 2022-02-01 DIAGNOSIS — Z510 Encounter for antineoplastic radiation therapy: Secondary | ICD-10-CM | POA: Diagnosis not present

## 2022-02-01 DIAGNOSIS — C9030 Solitary plasmacytoma not having achieved remission: Secondary | ICD-10-CM | POA: Diagnosis not present

## 2022-02-01 LAB — RAD ONC ARIA SESSION SUMMARY
Course Elapsed Days: 14
Plan Fractions Treated to Date: 11
Plan Prescribed Dose Per Fraction: 2.1 Gy
Plan Total Fractions Prescribed: 20
Plan Total Prescribed Dose: 42 Gy
Reference Point Dosage Given to Date: 23.1 Gy
Reference Point Session Dosage Given: 2.1 Gy
Session Number: 11

## 2022-02-02 ENCOUNTER — Ambulatory Visit
Admission: RE | Admit: 2022-02-02 | Discharge: 2022-02-02 | Disposition: A | Discharge status: Home / Self Care | Payer: Medicare Other | Source: Ambulatory Visit | Attending: Radiation Oncology | Admitting: Radiation Oncology

## 2022-02-02 ENCOUNTER — Other Ambulatory Visit: Payer: Self-pay

## 2022-02-02 ENCOUNTER — Ambulatory Visit: Payer: Medicare Other

## 2022-02-02 DIAGNOSIS — C9030 Solitary plasmacytoma not having achieved remission: Secondary | ICD-10-CM | POA: Diagnosis not present

## 2022-02-02 DIAGNOSIS — Z510 Encounter for antineoplastic radiation therapy: Secondary | ICD-10-CM | POA: Diagnosis not present

## 2022-02-02 LAB — RAD ONC ARIA SESSION SUMMARY
Course Elapsed Days: 15
Plan Fractions Treated to Date: 12
Plan Prescribed Dose Per Fraction: 2.1 Gy
Plan Total Fractions Prescribed: 20
Plan Total Prescribed Dose: 42 Gy
Reference Point Dosage Given to Date: 25.2 Gy
Reference Point Session Dosage Given: 2.1 Gy
Session Number: 12

## 2022-02-03 ENCOUNTER — Ambulatory Visit: Payer: Medicare Other

## 2022-02-03 ENCOUNTER — Ambulatory Visit
Admission: RE | Admit: 2022-02-03 | Discharge: 2022-02-03 | Disposition: A | Discharge status: Home / Self Care | Payer: Medicare Other | Source: Ambulatory Visit | Attending: Radiation Oncology | Admitting: Radiation Oncology

## 2022-02-03 ENCOUNTER — Other Ambulatory Visit: Payer: Self-pay

## 2022-02-03 DIAGNOSIS — C9030 Solitary plasmacytoma not having achieved remission: Secondary | ICD-10-CM | POA: Diagnosis not present

## 2022-02-03 DIAGNOSIS — Z510 Encounter for antineoplastic radiation therapy: Secondary | ICD-10-CM | POA: Diagnosis not present

## 2022-02-03 LAB — RAD ONC ARIA SESSION SUMMARY
Course Elapsed Days: 16
Plan Fractions Treated to Date: 13
Plan Prescribed Dose Per Fraction: 2.1 Gy
Plan Total Fractions Prescribed: 20
Plan Total Prescribed Dose: 42 Gy
Reference Point Dosage Given to Date: 27.3 Gy
Reference Point Session Dosage Given: 2.1 Gy
Session Number: 13

## 2022-02-04 ENCOUNTER — Ambulatory Visit: Payer: Medicare Other

## 2022-02-04 ENCOUNTER — Ambulatory Visit
Admission: RE | Admit: 2022-02-04 | Discharge: 2022-02-04 | Disposition: A | Discharge status: Home / Self Care | Payer: Medicare Other | Source: Ambulatory Visit | Attending: Radiation Oncology | Admitting: Radiation Oncology

## 2022-02-04 ENCOUNTER — Other Ambulatory Visit: Payer: Self-pay

## 2022-02-04 DIAGNOSIS — C9030 Solitary plasmacytoma not having achieved remission: Secondary | ICD-10-CM | POA: Diagnosis not present

## 2022-02-04 DIAGNOSIS — Z510 Encounter for antineoplastic radiation therapy: Secondary | ICD-10-CM | POA: Diagnosis not present

## 2022-02-04 LAB — RAD ONC ARIA SESSION SUMMARY
Course Elapsed Days: 17
Plan Fractions Treated to Date: 14
Plan Prescribed Dose Per Fraction: 2.1 Gy
Plan Total Fractions Prescribed: 20
Plan Total Prescribed Dose: 42 Gy
Reference Point Dosage Given to Date: 29.4 Gy
Reference Point Session Dosage Given: 2.1 Gy
Session Number: 14

## 2022-02-05 ENCOUNTER — Other Ambulatory Visit: Payer: Self-pay

## 2022-02-05 ENCOUNTER — Ambulatory Visit
Admission: RE | Admit: 2022-02-05 | Discharge: 2022-02-05 | Disposition: A | Discharge status: Home / Self Care | Payer: Medicare Other | Source: Ambulatory Visit | Attending: Radiation Oncology | Admitting: Radiation Oncology

## 2022-02-05 ENCOUNTER — Ambulatory Visit: Payer: Medicare Other

## 2022-02-05 DIAGNOSIS — Z510 Encounter for antineoplastic radiation therapy: Secondary | ICD-10-CM | POA: Diagnosis not present

## 2022-02-05 DIAGNOSIS — C9030 Solitary plasmacytoma not having achieved remission: Secondary | ICD-10-CM | POA: Diagnosis not present

## 2022-02-05 LAB — RAD ONC ARIA SESSION SUMMARY
Course Elapsed Days: 18
Plan Fractions Treated to Date: 15
Plan Prescribed Dose Per Fraction: 2.1 Gy
Plan Total Fractions Prescribed: 20
Plan Total Prescribed Dose: 42 Gy
Reference Point Dosage Given to Date: 31.5 Gy
Reference Point Session Dosage Given: 2.1 Gy
Session Number: 15

## 2022-02-08 ENCOUNTER — Other Ambulatory Visit: Payer: Self-pay

## 2022-02-08 ENCOUNTER — Ambulatory Visit
Admission: RE | Admit: 2022-02-08 | Discharge: 2022-02-08 | Disposition: A | Discharge status: Home / Self Care | Payer: Medicare Other | Source: Ambulatory Visit | Attending: Radiation Oncology | Admitting: Radiation Oncology

## 2022-02-08 DIAGNOSIS — Z510 Encounter for antineoplastic radiation therapy: Secondary | ICD-10-CM | POA: Diagnosis not present

## 2022-02-08 DIAGNOSIS — C9030 Solitary plasmacytoma not having achieved remission: Secondary | ICD-10-CM | POA: Diagnosis not present

## 2022-02-08 LAB — RAD ONC ARIA SESSION SUMMARY
Course Elapsed Days: 21
Plan Fractions Treated to Date: 16
Plan Prescribed Dose Per Fraction: 2.1 Gy
Plan Total Fractions Prescribed: 20
Plan Total Prescribed Dose: 42 Gy
Reference Point Dosage Given to Date: 33.6 Gy
Reference Point Session Dosage Given: 2.1 Gy
Session Number: 16

## 2022-02-09 ENCOUNTER — Other Ambulatory Visit: Payer: Self-pay

## 2022-02-09 ENCOUNTER — Ambulatory Visit
Admission: RE | Admit: 2022-02-09 | Discharge: 2022-02-09 | Disposition: A | Discharge status: Home / Self Care | Payer: Medicare Other | Source: Ambulatory Visit | Attending: Radiation Oncology | Admitting: Radiation Oncology

## 2022-02-09 DIAGNOSIS — C9030 Solitary plasmacytoma not having achieved remission: Secondary | ICD-10-CM | POA: Diagnosis not present

## 2022-02-09 DIAGNOSIS — Z510 Encounter for antineoplastic radiation therapy: Secondary | ICD-10-CM | POA: Diagnosis not present

## 2022-02-09 LAB — RAD ONC ARIA SESSION SUMMARY
Course Elapsed Days: 22
Plan Fractions Treated to Date: 17
Plan Prescribed Dose Per Fraction: 2.1 Gy
Plan Total Fractions Prescribed: 20
Plan Total Prescribed Dose: 42 Gy
Reference Point Dosage Given to Date: 35.7 Gy
Reference Point Session Dosage Given: 2.1 Gy
Session Number: 17

## 2022-02-10 ENCOUNTER — Ambulatory Visit
Admission: RE | Admit: 2022-02-10 | Discharge: 2022-02-10 | Disposition: A | Discharge status: Home / Self Care | Payer: Medicare Other | Source: Ambulatory Visit | Attending: Radiation Oncology | Admitting: Radiation Oncology

## 2022-02-10 ENCOUNTER — Other Ambulatory Visit: Payer: Self-pay

## 2022-02-10 DIAGNOSIS — C9030 Solitary plasmacytoma not having achieved remission: Secondary | ICD-10-CM | POA: Diagnosis not present

## 2022-02-10 DIAGNOSIS — Z510 Encounter for antineoplastic radiation therapy: Secondary | ICD-10-CM | POA: Diagnosis not present

## 2022-02-10 LAB — RAD ONC ARIA SESSION SUMMARY
Course Elapsed Days: 23
Plan Fractions Treated to Date: 18
Plan Prescribed Dose Per Fraction: 2.1 Gy
Plan Total Fractions Prescribed: 20
Plan Total Prescribed Dose: 42 Gy
Reference Point Dosage Given to Date: 37.8 Gy
Reference Point Session Dosage Given: 2.1 Gy
Session Number: 18

## 2022-02-11 ENCOUNTER — Ambulatory Visit
Admission: RE | Admit: 2022-02-11 | Discharge: 2022-02-11 | Disposition: A | Discharge status: Home / Self Care | Payer: Medicare Other | Source: Ambulatory Visit | Attending: Radiation Oncology | Admitting: Radiation Oncology

## 2022-02-11 ENCOUNTER — Other Ambulatory Visit: Payer: Self-pay

## 2022-02-11 DIAGNOSIS — C9030 Solitary plasmacytoma not having achieved remission: Secondary | ICD-10-CM | POA: Diagnosis not present

## 2022-02-11 DIAGNOSIS — Z510 Encounter for antineoplastic radiation therapy: Secondary | ICD-10-CM | POA: Diagnosis not present

## 2022-02-11 LAB — RAD ONC ARIA SESSION SUMMARY
Course Elapsed Days: 24
Plan Fractions Treated to Date: 19
Plan Prescribed Dose Per Fraction: 2.1 Gy
Plan Total Fractions Prescribed: 20
Plan Total Prescribed Dose: 42 Gy
Reference Point Dosage Given to Date: 39.9 Gy
Reference Point Session Dosage Given: 2.1 Gy
Session Number: 19

## 2022-02-12 ENCOUNTER — Ambulatory Visit
Admission: RE | Admit: 2022-02-12 | Discharge: 2022-02-12 | Disposition: A | Discharge status: Home / Self Care | Payer: Medicare Other | Source: Ambulatory Visit | Attending: Radiation Oncology | Admitting: Radiation Oncology

## 2022-02-12 ENCOUNTER — Other Ambulatory Visit: Payer: Self-pay

## 2022-02-12 DIAGNOSIS — C9030 Solitary plasmacytoma not having achieved remission: Secondary | ICD-10-CM | POA: Diagnosis not present

## 2022-02-12 DIAGNOSIS — Z510 Encounter for antineoplastic radiation therapy: Secondary | ICD-10-CM | POA: Diagnosis not present

## 2022-02-12 LAB — RAD ONC ARIA SESSION SUMMARY
Course Elapsed Days: 25
Plan Fractions Treated to Date: 20
Plan Prescribed Dose Per Fraction: 2.1 Gy
Plan Total Fractions Prescribed: 20
Plan Total Prescribed Dose: 42 Gy
Reference Point Dosage Given to Date: 42 Gy
Reference Point Session Dosage Given: 2.1 Gy
Session Number: 20

## 2022-02-23 DIAGNOSIS — C672 Malignant neoplasm of lateral wall of bladder: Secondary | ICD-10-CM | POA: Diagnosis not present

## 2022-02-23 DIAGNOSIS — Z5111 Encounter for antineoplastic chemotherapy: Secondary | ICD-10-CM | POA: Diagnosis not present

## 2022-02-26 ENCOUNTER — Encounter: Payer: Self-pay | Admitting: Urology

## 2022-03-02 DIAGNOSIS — C672 Malignant neoplasm of lateral wall of bladder: Secondary | ICD-10-CM | POA: Diagnosis not present

## 2022-03-02 DIAGNOSIS — Z5111 Encounter for antineoplastic chemotherapy: Secondary | ICD-10-CM | POA: Diagnosis not present

## 2022-03-09 DIAGNOSIS — C672 Malignant neoplasm of lateral wall of bladder: Secondary | ICD-10-CM | POA: Diagnosis not present

## 2022-03-09 DIAGNOSIS — R8271 Bacteriuria: Secondary | ICD-10-CM | POA: Diagnosis not present

## 2022-03-16 DIAGNOSIS — Z5111 Encounter for antineoplastic chemotherapy: Secondary | ICD-10-CM | POA: Diagnosis not present

## 2022-03-16 DIAGNOSIS — C672 Malignant neoplasm of lateral wall of bladder: Secondary | ICD-10-CM | POA: Diagnosis not present

## 2022-03-20 ENCOUNTER — Other Ambulatory Visit: Payer: Self-pay

## 2022-03-20 ENCOUNTER — Encounter (HOSPITAL_BASED_OUTPATIENT_CLINIC_OR_DEPARTMENT_OTHER): Payer: Self-pay

## 2022-03-20 ENCOUNTER — Emergency Department (HOSPITAL_BASED_OUTPATIENT_CLINIC_OR_DEPARTMENT_OTHER)
Admission: EM | Admit: 2022-03-20 | Discharge: 2022-03-20 | Disposition: A | Discharge status: Home / Self Care | Payer: Medicare Other | Attending: Emergency Medicine | Admitting: Emergency Medicine

## 2022-03-20 DIAGNOSIS — Z8551 Personal history of malignant neoplasm of bladder: Secondary | ICD-10-CM | POA: Diagnosis not present

## 2022-03-20 DIAGNOSIS — N3091 Cystitis, unspecified with hematuria: Secondary | ICD-10-CM | POA: Diagnosis not present

## 2022-03-20 DIAGNOSIS — R319 Hematuria, unspecified: Secondary | ICD-10-CM | POA: Diagnosis present

## 2022-03-20 DIAGNOSIS — N3090 Cystitis, unspecified without hematuria: Secondary | ICD-10-CM | POA: Diagnosis not present

## 2022-03-20 DIAGNOSIS — T50A95A Adverse effect of other bacterial vaccines, initial encounter: Secondary | ICD-10-CM

## 2022-03-20 LAB — URINALYSIS, ROUTINE W REFLEX MICROSCOPIC
Bilirubin Urine: NEGATIVE
Glucose, UA: NEGATIVE mg/dL
Ketones, ur: NEGATIVE mg/dL
Nitrite: NEGATIVE
Protein, ur: 100 mg/dL — AB
RBC / HPF: >50 RBC/hpf — ABNORMAL HIGH (ref 0–5)
Specific Gravity, Urine: 1.03 (ref 1.005–1.030)
WBC, UA: >50 WBC/hpf — ABNORMAL HIGH (ref 0–5)
pH: 6 (ref 5.0–8.0)

## 2022-03-20 LAB — CBC WITH DIFFERENTIAL/PLATELET
Abs Immature Granulocytes: 0.01 10*3/uL (ref 0.00–0.07)
Basophils Absolute: 0 10*3/uL (ref 0.0–0.1)
Basophils Relative: 1 %
Eosinophils Absolute: 0.7 10*3/uL — ABNORMAL HIGH (ref 0.0–0.5)
Eosinophils Relative: 14 %
HCT: 41.4 % (ref 39.0–52.0)
Hemoglobin: 14 g/dL (ref 13.0–17.0)
Immature Granulocytes: 0 %
Lymphocytes Relative: 23 %
Lymphs Abs: 1.2 10*3/uL (ref 0.7–4.0)
MCH: 31.1 pg (ref 26.0–34.0)
MCHC: 33.8 g/dL (ref 30.0–36.0)
MCV: 92 fL (ref 80.0–100.0)
Monocytes Absolute: 0.4 10*3/uL (ref 0.1–1.0)
Monocytes Relative: 8 %
Neutro Abs: 2.7 10*3/uL (ref 1.7–7.7)
Neutrophils Relative %: 54 %
Platelets: 198 10*3/uL (ref 150–400)
RBC: 4.5 MIL/uL (ref 4.22–5.81)
RDW: 13.4 % (ref 11.5–15.5)
WBC: 5.1 10*3/uL (ref 4.0–10.5)
nRBC: 0 % (ref 0.0–0.2)

## 2022-03-20 LAB — COMPREHENSIVE METABOLIC PANEL
ALT: 18 U/L (ref 0–44)
AST: 17 U/L (ref 15–41)
Albumin: 4.2 g/dL (ref 3.5–5.0)
Alkaline Phosphatase: 131 U/L — ABNORMAL HIGH (ref 38–126)
Anion gap: 9 (ref 5–15)
BUN: 23 mg/dL (ref 8–23)
CO2: 24 mmol/L (ref 22–32)
Calcium: 9.4 mg/dL (ref 8.9–10.3)
Chloride: 105 mmol/L (ref 98–111)
Creatinine, Ser: 1.22 mg/dL (ref 0.61–1.24)
GFR, Estimated: >60 mL/min (ref >60)
Glucose, Bld: 156 mg/dL — ABNORMAL HIGH (ref 70–99)
Potassium: 4 mmol/L (ref 3.5–5.1)
Sodium: 138 mmol/L (ref 135–145)
Total Bilirubin: 0.4 mg/dL (ref 0.3–1.2)
Total Protein: 6.9 g/dL (ref 6.5–8.1)

## 2022-03-20 MED ORDER — CIPROFLOXACIN HCL 500 MG PO TABS: 500.0000 mg | ORAL_TABLET | Freq: Two times a day (BID) | ORAL | 0 refills | Status: AC

## 2022-03-20 MED ORDER — CIPROFLOXACIN HCL 500 MG PO TABS
500.0000 mg | ORAL_TABLET | Freq: Once | ORAL | Status: AC
  Administered 2022-03-20: 500 mg via ORAL
  Filled 2022-03-20: qty 1

## 2022-03-20 NOTE — ED Triage Notes (Signed)
Pt arrives via POV. Pt received a dose of BCG via a foley catheter on the the 7th for his bladder cancer. Pt states since then he has been experiencing hematuria, dysuria, and increased urinary frequency. Pt was prescribed abx on Tuesday but has not had relief since.

## 2022-03-20 NOTE — Discharge Instructions (Addendum)
Start taking the antibiotics that are prescribed. Please call the urologist immediately if your symptoms are not improving, and getting worse.  Return to the ER if you start having fevers, chills, confusion, severe nausea and vomiting.

## 2022-03-20 NOTE — ED Provider Notes (Signed)
[facility] EMERGENCY DEPT Provider Note   CSN: [id number] Arrival date & time: 03/20/22  1227     History {Add pertinent medical, surgical, social history, OB history to HPI:1} Chief Complaint  Patient presents with   Hematuria   Dysuria   Urinary Frequency    Chad Flores is a 71 y.o. male.  HPI    71 year old male with history of bladder cancer  Home Medications Prior to Admission medications   Medication Sig Start Date End Date Taking? Authorizing Provider  ciprofloxacin (CIPRO) 500 MG tablet Take 1 tablet (500 mg total) by mouth 2 (two) times daily for 5 days. 03/20/22 03/25/22 Yes Varney Biles, MD  acetaminophen (TYLENOL) 500 MG tablet Take 500 mg by mouth every 6 (six) hours as needed for mild pain or headache.    [provider]  albuterol (VENTOLIN HFA) 108 (90 Base) MCG/ACT inhaler Inhale 2 puffs into the lungs every 6 (six) hours as needed for wheezing or shortness of breath.    [provider]  ascorbic acid (VITAMIN C) 500 MG tablet Take 500 mg by mouth daily.    [provider]  aspirin EC 81 MG tablet Take 81 mg by mouth daily.    [provider]  atorvastatin (LIPITOR) 10 MG tablet Take 10 mg by mouth daily. 01/26/17   [provider]  buPROPion (WELLBUTRIN XL) 150 MG 24 hr tablet Take 150 mg by mouth daily. 01/26/17   [provider]  calcium carbonate (TUMS - DOSED IN MG ELEMENTAL CALCIUM) 500 MG chewable tablet Chew 1 tablet by mouth as needed for indigestion or heartburn.    [provider]  cholecalciferol (VITAMIN D3) 25 MCG (1000 UNIT) tablet Take 1,000 Units by mouth daily with breakfast.    [provider]  furosemide (LASIX) 20 MG tablet Take 20 mg by mouth daily as needed. 12/21/21   [provider]  NONFORMULARY OR COMPOUNDED ITEM Callous/Corn Cream: Salicylic acid 93%, Urea 79% Cream Order faxed to [facility] Patient taking differently: Apply  1 application  topically See admin instructions. Callous/Corn Cream: Salicylic acid 02%, Urea 40% Cream- Apply to affected corns once a day before bandaging as needed 01/17/20   Trula Slade, DPM  oxyCODONE-acetaminophen (PERCOCET) 5-325 MG tablet Take 1 tablet by mouth every 4 (four) hours as needed for severe pain. Patient not taking: Reported on 01/12/2022 11/26/21 11/26/22  Darliss Cheney, MD  tamsulosin (FLOMAX) 0.4 MG CAPS capsule Take 1 capsule (0.4 mg total) by mouth daily. Patient taking differently: Take 0.4 mg by mouth in the morning and at bedtime. 11/26/20   Ceasar Mons, MD  vitamin B-12 (CYANOCOBALAMIN) 1000 MCG tablet Take 1,000 mcg by mouth daily.    [provider]  zinc gluconate 50 MG tablet Take 50 mg by mouth daily.    [provider]      Allergies    Penicillin g    Review of Systems   Review of Systems  Physical Exam Updated Vital Signs BP (!) 107/56   Pulse 74   Temp 97.6 F (36.4 C)   Resp 16   Ht '5\' 10"'$  (1.778 m)   Wt 90.7 kg   SpO2 93%   BMI 28.70 kg/m  Physical Exam  ED Results / Procedures / Treatments   Labs (all labs ordered are listed, but only abnormal results are displayed) Labs Reviewed  URINALYSIS, ROUTINE W REFLEX MICROSCOPIC - Abnormal; Notable for the following components:  Result Value   Color, Urine ORANGE (*)    APPearance CLOUDY (*)    Hgb urine dipstick LARGE (*)    Protein, ur 100 (*)    Leukocytes,Ua LARGE (*)    RBC / HPF >50 (*)    WBC, UA >50 (*)    Bacteria, UA FEW (*)    All other components within normal limits  CBC WITH DIFFERENTIAL/PLATELET - Abnormal; Notable for the following components:   Eosinophils Absolute 0.7 (*)    All other components within normal limits  COMPREHENSIVE METABOLIC PANEL - Abnormal; Notable for the following components:   Glucose, Bld 156 (*)    Alkaline Phosphatase 131 (*)    All other components within normal limits  URINE CULTURE     EKG None  Radiology No results found.  Procedures Procedures  {Document cardiac monitor, telemetry assessment procedure when appropriate:1}  Medications Ordered in ED Medications  ciprofloxacin (CIPRO) tablet 500 mg (has no administration in time range)    ED Course/ Medical Decision Making/ A&P                           Medical Decision Making Amount and/or Complexity of Data Reviewed Labs: ordered.  Risk Prescription drug management.   ***  {Document critical care time when appropriate:1} {Document review of labs and clinical decision tools ie heart score, Chads2Vasc2 etc:1}  {Document your independent review of radiology images, and any outside records:1} {Document your discussion with family members, caretakers, and with consultants:1} {Document social determinants of health affecting pt's care:1} {Document your decision making why or why not admission, treatments were needed:1} Final Clinical Impression(s) / ED Diagnoses Final diagnoses:  BCG cystitis    Rx / DC Orders ED Discharge Orders          Ordered    ciprofloxacin (CIPRO) 500 MG tablet  2 times daily        03/20/22 1601

## 2022-03-21 LAB — URINE CULTURE: Culture: NO GROWTH

## 2022-04-05 ENCOUNTER — Telehealth: Payer: Self-pay

## 2022-04-05 NOTE — Telephone Encounter (Signed)
     Patient  visit on 03/20/2022  at [facility] was for Other cystitis without hematuria.  Have you been able to follow up with your primary care physician? Patient did not need to see PCP antibiotics were effective.  The patient was or was not able to obtain any needed medicine or equipment. Patient was able to obtain medication.  Are there diet recommendations that you are having difficulty following? No  Patient expresses understanding of discharge instructions and education provided has no other needs at this time.    [facility] Resource Care Guide   [email or web address]  ?? [phone]   Website: [email or web address]  [email or web address]

## 2022-04-13 DIAGNOSIS — Z125 Encounter for screening for malignant neoplasm of prostate: Secondary | ICD-10-CM | POA: Diagnosis not present

## 2022-04-15 ENCOUNTER — Inpatient Hospital Stay: Payer: Medicare Other | Attending: Oncology

## 2022-04-15 ENCOUNTER — Inpatient Hospital Stay (HOSPITAL_BASED_OUTPATIENT_CLINIC_OR_DEPARTMENT_OTHER): Payer: Medicare Other | Admitting: Oncology

## 2022-04-15 VITALS — BP 101/64 | HR 69 | Temp 98.2°F | Resp 16 | Wt 198.2 lb

## 2022-04-15 DIAGNOSIS — J449 Chronic obstructive pulmonary disease, unspecified: Secondary | ICD-10-CM | POA: Insufficient documentation

## 2022-04-15 DIAGNOSIS — M549 Dorsalgia, unspecified: Secondary | ICD-10-CM | POA: Diagnosis not present

## 2022-04-15 DIAGNOSIS — Z87891 Personal history of nicotine dependence: Secondary | ICD-10-CM | POA: Insufficient documentation

## 2022-04-15 DIAGNOSIS — C9030 Solitary plasmacytoma not having achieved remission: Secondary | ICD-10-CM

## 2022-04-15 DIAGNOSIS — Z923 Personal history of irradiation: Secondary | ICD-10-CM | POA: Diagnosis not present

## 2022-04-15 LAB — CBC WITH DIFFERENTIAL (CANCER CENTER ONLY)
Abs Immature Granulocytes: 0.01 10*3/uL (ref 0.00–0.07)
Basophils Absolute: 0 10*3/uL (ref 0.0–0.1)
Basophils Relative: 1 %
Eosinophils Absolute: 0.5 10*3/uL (ref 0.0–0.5)
Eosinophils Relative: 13 %
HCT: 43.7 % (ref 39.0–52.0)
Hemoglobin: 14.8 g/dL (ref 13.0–17.0)
Immature Granulocytes: 0 %
Lymphocytes Relative: 32 %
Lymphs Abs: 1.3 10*3/uL (ref 0.7–4.0)
MCH: 31.7 pg (ref 26.0–34.0)
MCHC: 33.9 g/dL (ref 30.0–36.0)
MCV: 93.6 fL (ref 80.0–100.0)
Monocytes Absolute: 0.3 10*3/uL (ref 0.1–1.0)
Monocytes Relative: 7 %
Neutro Abs: 1.9 10*3/uL (ref 1.7–7.7)
Neutrophils Relative %: 47 %
Platelet Count: 177 10*3/uL (ref 150–400)
RBC: 4.67 MIL/uL (ref 4.22–5.81)
RDW: 12.7 % (ref 11.5–15.5)
WBC Count: 4 10*3/uL (ref 4.0–10.5)
nRBC: 0 % (ref 0.0–0.2)

## 2022-04-15 LAB — CMP (CANCER CENTER ONLY)
ALT: 22 U/L (ref 0–44)
AST: 19 U/L (ref 15–41)
Albumin: 4.3 g/dL (ref 3.5–5.0)
Alkaline Phosphatase: 125 U/L (ref 38–126)
Anion gap: 5 (ref 5–15)
BUN: 14 mg/dL (ref 8–23)
CO2: 27 mmol/L (ref 22–32)
Calcium: 9.4 mg/dL (ref 8.9–10.3)
Chloride: 109 mmol/L (ref 98–111)
Creatinine: 1.13 mg/dL (ref 0.61–1.24)
GFR, Estimated: >60 mL/min (ref >60)
Glucose, Bld: 127 mg/dL — ABNORMAL HIGH (ref 70–99)
Potassium: 4.1 mmol/L (ref 3.5–5.1)
Sodium: 141 mmol/L (ref 135–145)
Total Bilirubin: 0.6 mg/dL (ref 0.3–1.2)
Total Protein: 6.9 g/dL (ref 6.5–8.1)

## 2022-04-15 NOTE — Progress Notes (Signed)
  [facility] OFFICE PROGRESS NOTE   Diagnosis: Plasmacytoma  INTERVAL HISTORY:   Chad Flores returns as scheduled.  He completed radiation to the plasmacytoma of 01/18/2022 through 02/12/2022.  He reports mild malaise during radiation.  The back pain is much improved.  He now has back pain after prolonged standing.  He is not taking pain medication.  He continues BCG therapy with urology  Objective:  Vital signs in last 24 hours:  Blood pressure 101/64, pulse 69, temperature 98.2 F (36.8 C), temperature source Temporal, resp. rate 16, weight 198 lb 3.2 oz (89.9 kg), SpO2 98 %.    Lymphatics: No cervical, supraclavicular, axillary, or inguinal nodes Resp: Lungs clear bilaterally Cardio: Rate and rhythm GI: No hepatosplenomegaly Vascular: No leg edema    Lab Results:  Lab Results  Component Value Date   WBC 4.0 04/15/2022   HGB 14.8 04/15/2022   HCT 43.7 04/15/2022   MCV 93.6 04/15/2022   PLT 177 04/15/2022   NEUTROABS 1.9 04/15/2022    CMP  Lab Results  Component Value Date   NA 141 04/15/2022   K 4.1 04/15/2022   CL 109 04/15/2022   CO2 27 04/15/2022   GLUCOSE 127 (H) 04/15/2022   BUN 14 04/15/2022   CREATININE 1.13 04/15/2022   CALCIUM 9.4 04/15/2022   PROT 6.9 04/15/2022   ALBUMIN 4.3 04/15/2022   AST 19 04/15/2022   ALT 22 04/15/2022   ALKPHOS 125 04/15/2022   BILITOT 0.6 04/15/2022   GFRNONAA >60 04/15/2022   GFRAA >60 03/24/2017    No results found for: "CEA1", "CEA", "ZOX096", "CA125"  Lab Results  Component Value Date   INR 0.9 12/17/2021   LABPROT 12.2 12/17/2021    Imaging:  No results found.  Medications: I have reviewed the patient's current medications.   Assessment/Plan: L4 plasmacytoma MRI lumbar spine 11/25/2021-abnormal marrow signal at L4, moderate loss of inferior endplate height, mild enhancement at the left L5 pedicle and adjacent to left L4-5 facets favored to reflect arthropathy L4 biopsy  12/17/2021-plasmacytoma, kappa light chain restricted Bone survey 12/28/2021-L4 kyphoplasty, no other lytic or sclerotic lesion Normal immunoglobulin levels 12/28/2021 0.4 g serum M spike Serum immunofixation-monoclonal IgG kappa protein Mild elevation of serum free kappa light chains, normal ratio Bone marrow biopsy 01/08/2022-kappa restricted plasma cells involving less than 10% of the cellular marrow with plasma cells measured at 3% on 500 cell count 46 XY, myeloma FISH panel negative Radiation to L4 01/18/2022 - 02/12/2022 Back pain secondary to #1 L4 kyphoplasty and ablation 12/17/2021 3.  Noninvasive urothelial carcinoma of the bladder-status post TURBT 11/26/2020 followed by induction and maintenance BCG therapy 4.  COPD 5.  History of tobacco use-followed in the lung cancer screening program      Disposition: Chad Flores has a history of an L4 plasmacytoma with a mild bone marrow plasmacytosis.  He completed radiation to L4 with marked improvement in back pain.  There is no clinical evidence for progression to multiple myeloma.  We will follow-up on the myeloma panel from today.  He will return for an office and lab visit in 3 months.  He will continue follow-up with urology for management of the noninvasive urothelial carcinoma.  Betsy Coder, MD  04/15/2022  10:53 AM

## 2022-04-16 DIAGNOSIS — C9030 Solitary plasmacytoma not having achieved remission: Secondary | ICD-10-CM | POA: Diagnosis not present

## 2022-04-16 DIAGNOSIS — F321 Major depressive disorder, single episode, moderate: Secondary | ICD-10-CM | POA: Diagnosis not present

## 2022-04-16 DIAGNOSIS — E1169 Type 2 diabetes mellitus with other specified complication: Secondary | ICD-10-CM | POA: Diagnosis not present

## 2022-04-16 DIAGNOSIS — E7800 Pure hypercholesterolemia, unspecified: Secondary | ICD-10-CM | POA: Diagnosis not present

## 2022-04-16 DIAGNOSIS — I719 Aortic aneurysm of unspecified site, without rupture: Secondary | ICD-10-CM | POA: Diagnosis not present

## 2022-04-16 DIAGNOSIS — Z0000 Encounter for general adult medical examination without abnormal findings: Secondary | ICD-10-CM | POA: Diagnosis not present

## 2022-04-16 DIAGNOSIS — Z23 Encounter for immunization: Secondary | ICD-10-CM | POA: Diagnosis not present

## 2022-04-16 DIAGNOSIS — C679 Malignant neoplasm of bladder, unspecified: Secondary | ICD-10-CM | POA: Diagnosis not present

## 2022-04-16 DIAGNOSIS — I700 Atherosclerosis of aorta: Secondary | ICD-10-CM | POA: Diagnosis not present

## 2022-04-19 DIAGNOSIS — R8271 Bacteriuria: Secondary | ICD-10-CM | POA: Diagnosis not present

## 2022-04-19 DIAGNOSIS — R351 Nocturia: Secondary | ICD-10-CM | POA: Diagnosis not present

## 2022-04-19 DIAGNOSIS — Z8551 Personal history of malignant neoplasm of bladder: Secondary | ICD-10-CM | POA: Diagnosis not present

## 2022-04-19 DIAGNOSIS — N401 Enlarged prostate with lower urinary tract symptoms: Secondary | ICD-10-CM | POA: Diagnosis not present

## 2022-04-19 LAB — PROTEIN ELECTROPHORESIS, SERUM
A/G Ratio: 1.4 (ref 0.7–1.7)
Albumin ELP: 3.7 g/dL (ref 2.9–4.4)
Alpha-1-Globulin: 0.2 g/dL (ref 0.0–0.4)
Alpha-2-Globulin: 0.7 g/dL (ref 0.4–1.0)
Beta Globulin: 0.8 g/dL (ref 0.7–1.3)
Gamma Globulin: 0.9 g/dL (ref 0.4–1.8)
Globulin, Total: 2.6 g/dL (ref 2.2–3.9)
M-Spike, %: 0.3 g/dL — ABNORMAL HIGH
Total Protein ELP: 6.3 g/dL (ref 6.0–8.5)

## 2022-04-19 LAB — KAPPA/LAMBDA LIGHT CHAINS
Kappa free light chain: 28.1 mg/L — ABNORMAL HIGH (ref 3.3–19.4)
Kappa, lambda light chain ratio: 1.68 — ABNORMAL HIGH (ref 0.26–1.65)
Lambda free light chains: 16.7 mg/L (ref 5.7–26.3)

## 2022-04-26 DIAGNOSIS — H2513 Age-related nuclear cataract, bilateral: Secondary | ICD-10-CM | POA: Diagnosis not present

## 2022-05-06 ENCOUNTER — Ambulatory Visit (INDEPENDENT_AMBULATORY_CARE_PROVIDER_SITE_OTHER): Payer: Medicare Other | Admitting: Orthopaedic Surgery

## 2022-05-06 ENCOUNTER — Ambulatory Visit (INDEPENDENT_AMBULATORY_CARE_PROVIDER_SITE_OTHER): Payer: Medicare Other

## 2022-05-06 ENCOUNTER — Encounter: Payer: Self-pay | Admitting: Orthopaedic Surgery

## 2022-05-06 VITALS — Ht 70.0 in | Wt 198.0 lb

## 2022-05-06 DIAGNOSIS — M545 Low back pain, unspecified: Secondary | ICD-10-CM

## 2022-05-06 DIAGNOSIS — C9030 Solitary plasmacytoma not having achieved remission: Secondary | ICD-10-CM | POA: Diagnosis not present

## 2022-05-06 NOTE — Progress Notes (Signed)
Office Visit Note   Patient: Chad Flores           Date of Birth: August 26, 1950           MRN: [id number] Visit Date: 05/06/2022              Requested by: London Pepper, MD [address],  [place] [address] PCP: London Pepper, MD   Assessment & Plan: Visit Diagnoses:  1. Acute bilateral low back pain without sciatica   2. Solitary plasmacytoma not having achieved remission [facility])     Plan: Mr. Want has a history of a solitary plasmacytoma of L4 associated with a compression fracture.  The diagnosis was made after biopsy of L4 prior to the kyphoplasty.  His pain resolved after the kyphoplasty and has done relatively well.  He has had radiation therapy and relates that his "numbers are looking really good".  Over the weekend he developed current pain in his low back without referred discomfort to either lower extremity.  He does have percussible tenderness lumbar spine and there is a suggestion that he might have some mild compression of the superior endplate of L2.  He will wear his back support and I will obtain a new MRI scan.  I will have him follow-up with Dr. Laurance Flatten  Follow-Up Instructions: Return After MRI scan lumbar spine.   Orders:  Orders Placed This Encounter  Procedures   XR Lumbar Spine 2-3 Views   MR Lumbar Spine w/o contrast   No orders of the defined types were placed in this encounter.     Procedures: No procedures performed   Clinical Data: No additional findings.   Subjective: Chief Complaint  Patient presents with   Lower Back - Pain  Patient presents with pain across his low back x 2 weeks after dropping something and bending over to pick it up. He denies radicular symptoms, but does state he can feel pain in the back when he picks legs up.  This pain is similar to what he had before, however, it is not as bad.  He is taking tylenol for pain with slight relief.  HPI  Review of Systems   Objective: Vital Signs: Ht  '5\' 10"'$  (1.778 m)   Wt 198 lb (89.8 kg)   BMI 28.41 kg/m   Physical Exam Constitutional:      Appearance: He is well-developed.  Eyes:     Pupils: Pupils are equal, round, and reactive to light.  Pulmonary:     Effort: Pulmonary effort is normal.  Skin:    General: Skin is warm and dry.  Neurological:     Mental Status: He is alert and oriented to person, place, and time.  Psychiatric:        Behavior: Behavior normal.     Ortho Exam awake alert and oriented x 3 comfortable sitting and in no acute distress.  There is percussible tenderness in the mid lumbar spine.  No flank pain.  Straight leg raise negative.  Painless range of motion of both hips.  Motor exam intact  Specialty Comments:  No specialty comments available.  Imaging: XR Lumbar Spine 2-3 Views  Result Date: 05/06/2022 Films of the lumbar spine were obtained in 2 projections.  There is been a previous compression fracture of L4 with kyphoplasty.  Based on comparison with other films there may be some slight indentation of the superior aspect of L2 possibly consistent with a new compression fracture.  PMFS History: Patient Active Problem List   Diagnosis Date Noted   Low back pain 05/06/2022   Solitary plasmacytoma ([facility]) 01/15/2022   Lumbar vertebral fracture, pathologic 12/02/2021   L4 vertebral fracture ([facility]) 11/25/2021   Mixed hyperlipidemia 11/25/2021   Urothelial cancer ([place]) 11/25/2021   Olecranon bursitis of left elbow 06/02/2021   Primary osteoarthritis of left knee 03/22/2017   S/P total knee replacement using cement, left 03/22/2017   Past Medical History:  Diagnosis Date   Abdominal aortic aneurysm (AAA) ([place])    followed by pcp---  per last ultrasound in epic 08-15-2019, proximal 3.3cm   Anxiety    Bladder mass    urologist-- dr winter   COPD (chronic obstructive pulmonary disease) ([place])    followed by pcp---  last exacerbation approx. 2019,  does not have rescue, controlled with daily  advair   Diet-controlled diabetes mellitus ([place])    GERD (gastroesophageal reflux disease)    Osteoarthritis    Pulmonary nodules    followed by pcp---- last chest CT in epic 11-04-2020 bilateral tiny nodules stable   Urgency of urination     Family History  Problem Relation Age of Onset   Alzheimer's disease Mother    Cancer Father     Past Surgical History:  Procedure Laterality Date   CHOLECYSTECTOMY OPEN  1987   CYSTOSCOPY N/A 11/26/2020   Procedure: CYSTOSCOPY WITH EXAM UNDER ANESTHESIA/  TRANSURETHRAL RESECTION OF BLADDER TUMOR WITH GEMCITABINE INSTILLATION IN PACU , BILATERAL RETROGRADES,  RIGHT STENT PLACEMENT;  Surgeon: Ceasar Mons, MD;  Location: [facility];  Service: Urology;  Laterality: N/A;   ELBOW SURGERY Right 2008   IR BONE TUMOR(S)RF ABLATION  12/17/2021   IR KYPHO LUMBAR INC FX REDUCE BONE BX UNI/BIL CANNULATION INC/IMAGING  12/17/2021   KNEE ARTHROSCOPY W/ MENISCAL REPAIR Left    x3  last one 2012   TOTAL KNEE ARTHROPLASTY Left 03/22/2017   Procedure: LEFT TOTAL KNEE ARTHROPLASTY;  Surgeon: Garald Balding, MD;  Location: [facility];  Service: Orthopedics;  Laterality: Left;   TRIGGER FINGER RELEASE Right 2007   Social History   Occupational History   Not on file  Tobacco Use   Smoking status: Every Day    Packs/day: 0.50    Years: 50.00    Total pack years: 25.00    Types: Cigarettes   Smokeless tobacco: Never  Vaping Use   Vaping Use: Former   Quit date: 12/12/2018   Devices: vaped, unsure name  Substance and Sexual Activity   Alcohol use: Yes    Alcohol/week: 1.0 standard drink of alcohol    Types: 1 Standard drinks or equivalent per week    Comment: occasional   Drug use: Never   Sexual activity: Not on file

## 2022-05-11 DIAGNOSIS — U071 COVID-19: Secondary | ICD-10-CM | POA: Diagnosis not present

## 2022-05-12 ENCOUNTER — Other Ambulatory Visit: Payer: Self-pay

## 2022-05-12 DIAGNOSIS — M545 Low back pain, unspecified: Secondary | ICD-10-CM

## 2022-05-12 DIAGNOSIS — C9030 Solitary plasmacytoma not having achieved remission: Secondary | ICD-10-CM

## 2022-05-12 DIAGNOSIS — M8448XS Pathological fracture, other site, sequela: Secondary | ICD-10-CM

## 2022-05-26 ENCOUNTER — Ambulatory Visit
Admission: RE | Admit: 2022-05-26 | Discharge: 2022-05-26 | Disposition: A | Discharge status: Home / Self Care | Payer: Medicare Other | Source: Ambulatory Visit | Attending: Orthopaedic Surgery | Admitting: Orthopaedic Surgery

## 2022-05-26 DIAGNOSIS — M545 Low back pain, unspecified: Secondary | ICD-10-CM

## 2022-05-26 DIAGNOSIS — Z8551 Personal history of malignant neoplasm of bladder: Secondary | ICD-10-CM | POA: Diagnosis not present

## 2022-05-26 DIAGNOSIS — M48061 Spinal stenosis, lumbar region without neurogenic claudication: Secondary | ICD-10-CM | POA: Diagnosis not present

## 2022-05-26 DIAGNOSIS — M5127 Other intervertebral disc displacement, lumbosacral region: Secondary | ICD-10-CM | POA: Diagnosis not present

## 2022-05-26 DIAGNOSIS — M5126 Other intervertebral disc displacement, lumbar region: Secondary | ICD-10-CM | POA: Diagnosis not present

## 2022-06-02 ENCOUNTER — Ambulatory Visit (INDEPENDENT_AMBULATORY_CARE_PROVIDER_SITE_OTHER): Payer: Medicare Other | Admitting: Orthopedic Surgery

## 2022-06-02 ENCOUNTER — Encounter: Payer: Self-pay | Admitting: Orthopedic Surgery

## 2022-06-02 ENCOUNTER — Ambulatory Visit (INDEPENDENT_AMBULATORY_CARE_PROVIDER_SITE_OTHER): Payer: Medicare Other

## 2022-06-02 VITALS — BP 93/60 | HR 76 | Ht 70.0 in | Wt 199.0 lb

## 2022-06-02 DIAGNOSIS — J3489 Other specified disorders of nose and nasal sinuses: Secondary | ICD-10-CM | POA: Diagnosis not present

## 2022-06-02 DIAGNOSIS — M545 Low back pain, unspecified: Secondary | ICD-10-CM

## 2022-06-02 DIAGNOSIS — Z03818 Encounter for observation for suspected exposure to other biological agents ruled out: Secondary | ICD-10-CM | POA: Diagnosis not present

## 2022-06-02 DIAGNOSIS — C9030 Solitary plasmacytoma not having achieved remission: Secondary | ICD-10-CM | POA: Diagnosis not present

## 2022-06-02 DIAGNOSIS — J019 Acute sinusitis, unspecified: Secondary | ICD-10-CM | POA: Diagnosis not present

## 2022-06-02 DIAGNOSIS — J439 Emphysema, unspecified: Secondary | ICD-10-CM | POA: Diagnosis not present

## 2022-06-02 NOTE — Progress Notes (Signed)
Orthopedic Spine Surgery Office Note  Assessment: Patient is a 72 y.o. male with history of bladder cancer and plasmacytoma at L4 who presents with acute on chronic low back pain. Has new lesion at T12.    Plan: -Gave him core exercises to do at home 3-4 times per week to help with the back pain. Can use tylenol '1000mg'$  TID as well -He has a new lesion at T12 that was not seen on his prior MRI. He has old CT scans that do not show this lesion either. It is small in size and I am not confident an accurate biopsy could be obtained given its size. He has had a metastatic work up already. His lab work was reassuring with Dr. Learta Codding. We decided after discussion to monitor it at this time and get a repeat MRI with contrast of his neuraxis at our next visit -Should his symptoms worsen or he develops radiating leg pain, he should be seen sooner. If he develops weakness, bowel/bladder incontinence, numbness then he needs to go to the ER -He has an appt with Dr. Learta Codding in April, he should keep that appt and get repeat blood work done -Patient should return to office in 26 weeks, x-rays at next visit: AP/lateral/flex/ex thoracolumbar   Patient expressed understanding of the plan and all questions were answered to the patient's satisfaction.   ___________________________________________________________________________   History:  Patient is a 72 y.o. male who presents today for lumbar spine. Patient has a history of low back pain and work up revealed a mass with pathological fracture at L4 in 2023. He underwent kyphoplasty with IR on 12/17/2021. Pathology showed plasmacytoma from the biopsy done at the time of the kyphoplasty. He subsequently underwent radiation treatment to L4. He has had pain in the low back since that time, but within the last 2 months, his pain has acutely worsened. He states he was lifting something at work back in December when the pain flared. It has remained at that intensity since  that time. It does not radiate into either lower extremity. Has stopped lifting and has been taking it easier but has not tried any other treatments. Was seen by his oncologist a little over a month ago at which time all of his laboratory work was reassuring, per the patient.    Weakness: denies Symptoms of imbalance: denies Paresthesias and numbness: denies Bowel or bladder incontinence: denies Saddle anesthesia: denies  Treatments tried: activity modification  Review of systems: Denies fevers and chills, night sweats, unexplained weight loss, history of cancer, pain that wakes them at night  Past medical history: HLD Bladder cancer Plasmacytoma  AAA GERD COPD Chronic pain  Allergies: penicillin  Past surgical history:  Left knee arthroscopy Left TKA Cholecystectomy Right elbow surgery Right trigger finger release L4 kyphoplasty  Social history: Denies use of nicotine product (smoking, vaping, patches, smokeless) Alcohol use: denies Denies recreational drug use   Physical Exam:  General: no acute distress, appears stated age Neurologic: alert, answering questions appropriately, following commands Respiratory: unlabored breathing on room air, symmetric chest rise Psychiatric: appropriate affect, normal cadence to speech   MSK (spine):  -Strength exam      Left  Right EHL    5/5  5/5 TA    5/5  5/5 GSC    5/5  5/5 Knee extension  5/5  5/5 Hip flexion   5/5  5/5  -Sensory exam    Sensation intact to light touch in L3-S1 nerve distributions of bilateral lower  extremities  -Achilles DTR: 1/4 on the left, 1/4 on the right -Patellar tendon DTR: 1/4 on the left, 1/4 on the right  -Straight leg raise: negative -Contralateral straight leg raise: negative -Femoral nerve stretch test: negative bilaterally -Clonus: no beats bilaterally  -Left hip exam: no pain through range of motion, negative stinchfield -Right hip exam: no pain through range of motion,  negative stinchfield  Imaging: XR of the lumbar spine from 06/02/2022 was independently reviewed and interpreted, showing cement augmentation into L4. Vertebral height at L4 appears maintained. No cement seen in the region of the canal. No bone-forming or lucent lesions seen within the vertebral bodies. No evidence of instability on flexion/extension views.   MRI of the lumbar spine from 05/26/2022 was independently reviewed and interpreted, showing decreased signal in the most of the L4 body but especially anteriorly. It is hypointense on T1 and T2. On only a couple of cuts on the sagittal and a cut on the axial, there is a hypointense lesion in T12 on T1 and T2. It is in the mid-body and on the left side. The T12 lesion shows increased singal on STIR sequences.    Patient name: Chad Flores Patient MRN: [id number] Date of visit: 06/02/22

## 2022-06-03 ENCOUNTER — Other Ambulatory Visit: Payer: Self-pay | Admitting: *Deleted

## 2022-06-03 ENCOUNTER — Telehealth: Payer: Self-pay

## 2022-06-03 DIAGNOSIS — C9030 Solitary plasmacytoma not having achieved remission: Secondary | ICD-10-CM

## 2022-06-03 NOTE — Telephone Encounter (Signed)
Pt's wife called in and report the patient had a MR Lumbar spine on the 06/05/22. She wants Sherrill to look at the MR and give her call she had  concerned about the new lesion. A placed the report on Dr Benay Spice.

## 2022-06-03 NOTE — Progress Notes (Signed)
Dr. Benay Spice discussed MRI results with Mrs. Hartmann and has ordered PET Whole body in next 2 weeks for staging myeloma with new T12 lesion on MRI. Order placed. Will need OV few days post PET scan.

## 2022-06-15 DIAGNOSIS — J329 Chronic sinusitis, unspecified: Secondary | ICD-10-CM | POA: Diagnosis not present

## 2022-06-18 ENCOUNTER — Encounter (HOSPITAL_COMMUNITY)
Admission: RE | Admit: 2022-06-18 | Discharge: 2022-06-18 | Disposition: A | Discharge status: Home / Self Care | Payer: Medicare Other | Source: Ambulatory Visit | Attending: Oncology | Admitting: Oncology

## 2022-06-18 DIAGNOSIS — C9030 Solitary plasmacytoma not having achieved remission: Secondary | ICD-10-CM | POA: Insufficient documentation

## 2022-06-18 DIAGNOSIS — R911 Solitary pulmonary nodule: Secondary | ICD-10-CM | POA: Diagnosis not present

## 2022-06-18 LAB — GLUCOSE, CAPILLARY: Glucose-Capillary: 84 mg/dL (ref 70–99)

## 2022-06-18 MED ORDER — FLUDEOXYGLUCOSE F - 18 (FDG) INJECTION
10.0000 | Freq: Once | INTRAVENOUS | Status: AC | PRN
  Administered 2022-06-18: 10 via INTRAVENOUS

## 2022-06-21 DIAGNOSIS — C672 Malignant neoplasm of lateral wall of bladder: Secondary | ICD-10-CM | POA: Diagnosis not present

## 2022-06-21 DIAGNOSIS — Z5111 Encounter for antineoplastic chemotherapy: Secondary | ICD-10-CM | POA: Diagnosis not present

## 2022-06-22 ENCOUNTER — Inpatient Hospital Stay: Payer: Medicare Other | Attending: Oncology | Admitting: Oncology

## 2022-06-22 VITALS — BP 112/59 | HR 76 | Temp 98.2°F | Resp 18 | Ht 70.0 in | Wt 198.0 lb

## 2022-06-22 DIAGNOSIS — M549 Dorsalgia, unspecified: Secondary | ICD-10-CM | POA: Insufficient documentation

## 2022-06-22 DIAGNOSIS — C9030 Solitary plasmacytoma not having achieved remission: Secondary | ICD-10-CM | POA: Diagnosis not present

## 2022-06-22 DIAGNOSIS — Z923 Personal history of irradiation: Secondary | ICD-10-CM | POA: Insufficient documentation

## 2022-06-22 DIAGNOSIS — Z8551 Personal history of malignant neoplasm of bladder: Secondary | ICD-10-CM | POA: Diagnosis not present

## 2022-06-22 NOTE — Progress Notes (Signed)
[facility] OFFICE PROGRESS NOTE   Diagnosis: Plasmacytoma  INTERVAL HISTORY:   Chad Flores returns prior to scheduled visit.  Orthopedics referred him for a lumbar MRI on 05/26/2022.  Posttreatment changes were noted at L4.  There is a new 5 mm lesion at T12 suspicious for a metastasis.  No other new lesions.  He was referred for a PET scan 06/18/2022.  There were no hypermetabolic lesions.  He reports persistent discomfort at the low back.  No other complaint.  He was diagnosed with COVID-19 in January when he presented with sinus symptoms. Objective:  Vital signs in last 24 hours:  Blood pressure (!) 112/59, pulse 76, temperature 98.2 F (36.8 C), temperature source Oral, resp. rate 18, height 5' 10"$  (1.778 m), weight 198 lb (89.8 kg), SpO2 98 %.    Resp: Distant breath sounds, no respiratory distress Cardio: Regular rate and rhythm GI: No hepatosplenomegaly Vascular: No leg edema Musculoskeletal: No spine tenderness  Lab Results:  Lab Results  Component Value Date   WBC 4.0 04/15/2022   HGB 14.8 04/15/2022   HCT 43.7 04/15/2022   MCV 93.6 04/15/2022   PLT 177 04/15/2022   NEUTROABS 1.9 04/15/2022    CMP  Lab Results  Component Value Date   NA 141 04/15/2022   K 4.1 04/15/2022   CL 109 04/15/2022   CO2 27 04/15/2022   GLUCOSE 127 (H) 04/15/2022   BUN 14 04/15/2022   CREATININE 1.13 04/15/2022   CALCIUM 9.4 04/15/2022   PROT 6.9 04/15/2022   ALBUMIN 4.3 04/15/2022   AST 19 04/15/2022   ALT 22 04/15/2022   ALKPHOS 125 04/15/2022   BILITOT 0.6 04/15/2022   GFRNONAA >60 04/15/2022   GFRAA >60 03/24/2017    No results found for: "CEA1", "CEA", "[id number]", "CA125"  Lab Results  Component Value Date   INR 0.9 12/17/2021   LABPROT 12.2 12/17/2021    Imaging:  NM PET Image Initial (PI) Whole Body  Result Date: 06/19/2022 CLINICAL DATA:  Initial treatment strategy for plasmacytoma of L4 vertebral body status post RFA and kyphoplasty  12/17/2021 and L4 vertebral radiation therapy completed 02/12/2022. Additional history of noninvasive bladder cancer status post TURBT in 2022 with ongoing BCG maintenance therapy. New small T12 vertebral lesion on recent MRI. EXAM: NUCLEAR MEDICINE PET WHOLE BODY TECHNIQUE: 9.9 mCi F-18 FDG was injected intravenously. Full-ring PET imaging was performed from the head to foot after the radiotracer. CT data was obtained and used for attenuation correction and anatomic localization. Fasting blood glucose: 84 mg/dl COMPARISON:  05/26/2022 MRI lumbar spine. 11/04/2021 chest CT. 09/24/2020 CT abdomen/pelvis. FINDINGS: Mediastinal blood pool activity: SUV max 2.5 HEAD/NECK: No hypermetabolic activity in the scalp. No hypermetabolic cervical lymph nodes. Incidental CT findings: none CHEST: No enlarged or hypermetabolic axillary, mediastinal or hilar lymph nodes. No hypermetabolic pulmonary findings. Incidental CT findings: Atherosclerotic nonaneurysmal thoracic aorta. Moderate centrilobular emphysema. Right middle lobe 0.4 cm solid pulmonary nodule along the minor fissure (series 7/image 39), below PET resolution, stable from 11/04/2021 chest CT. No new significant pulmonary nodules. ABDOMEN/PELVIS: No abnormal hypermetabolic activity within the liver, pancreas, adrenal glands, or spleen. No hypermetabolic lymph nodes in the abdomen or pelvis. Incidental CT findings: Cholecystectomy. Atherosclerotic abdominal aorta with dilated 2.8 cm infrarenal abdominal aorta. Moderate left colonic diverticulosis. SKELETON: No focal hypermetabolic activity to suggest skeletal metastasis. Specifically, no hypermetabolic [id number] vertebral lesion to correlate with the recent lumbar spine MRI finding, which is occult by CT. Status post L4 kyphoplasty with no  associated hypermetabolism. Decreased FDG uptake in the L3 through L5 levels compatible with prior radiation therapy. Incidental CT findings: none EXTREMITIES: No abnormal hypermetabolic  activity in the lower extremities. Incidental CT findings: Left total knee arthroplasty. IMPRESSION: 1. No hypermetabolic osseous lesion to correlate with the recent subcentimeter T12 vertebral lesion described on lumbar spine MRI, which is also occult by CT. 2. No L4 vertebral hypermetabolism status post L4 kyphoplasty. 3. No evidence of hypermetabolic metastatic disease. 4. Infrarenal 2.8 cm dilated infrarenal abdominal aorta. Recommend follow-up every 5 years. 5. Aortic Atherosclerosis (ICD10-I70.0) and Emphysema (ICD10-J43.9). Electronically Signed   By: Ilona Sorrel M.D.   On: 06/19/2022 20:07    Medications: I have reviewed the patient's current medications.   Assessment/Plan: L4 plasmacytoma MRI lumbar spine 11/25/2021-abnormal marrow signal at L4, moderate loss of inferior endplate height, mild enhancement at the left L5 pedicle and adjacent to left L4-5 facets favored to reflect arthropathy L4 biopsy 12/17/2021-plasmacytoma, kappa light chain restricted Bone survey 12/28/2021-L4 kyphoplasty, no other lytic or sclerotic lesion Normal immunoglobulin levels 12/28/2021 0.4 g serum M spike Serum immunofixation-monoclonal IgG kappa protein Mild elevation of serum free kappa light chains, normal ratio Bone marrow biopsy 01/08/2022-kappa restricted plasma cells involving less than 10% of the cellular marrow with plasma cells measured at 3% on 500 cell count 46 XY, myeloma FISH panel negative Radiation to L4 01/18/2022 - 02/12/2022 Lumbar MRI 05/26/2022-posttreatment changes at L4, new 5 mm lesion at T12 suspicious for a "metastasis " PET scan 0000000 hypermetabolic osseous disease to correlate with the T12 lesion.  No L4 hypermetabolism, no evidence of hypermetabolic metastatic disease Back pain secondary to #1 L4 kyphoplasty and ablation 12/17/2021 3.  Noninvasive urothelial carcinoma of the bladder-status post TURBT 11/26/2020 followed by induction and maintenance BCG therapy 4.  COPD 5.  History  of tobacco use-followed in the lung cancer screening program       Disposition: Mr Heinig was treated with a kyphoplasty and radiation for an L4 plasmacytoma.  He has a low-level serum M spike.  There is no clinical evidence for progression to multiple myeloma.  A new small T12 lesion was noted on MRI in January.  PET scan reveals no evidence of active myeloma.  I reviewed the PET and MRI images with Mr. Gargan and his wife.  The plan is to continue observation.  He will return for an office visit and repeat myeloma panel in April.  We will plan for a plain x-ray of the thoracic spine within the next 4-6 months.  He will call for increased back pain or new symptoms.  Betsy Coder, MD  06/22/2022  8:14 AM

## 2022-06-28 DIAGNOSIS — Z5111 Encounter for antineoplastic chemotherapy: Secondary | ICD-10-CM | POA: Diagnosis not present

## 2022-06-28 DIAGNOSIS — C672 Malignant neoplasm of lateral wall of bladder: Secondary | ICD-10-CM | POA: Diagnosis not present

## 2022-07-02 DIAGNOSIS — N3000 Acute cystitis without hematuria: Secondary | ICD-10-CM | POA: Diagnosis not present

## 2022-07-02 DIAGNOSIS — R300 Dysuria: Secondary | ICD-10-CM | POA: Diagnosis not present

## 2022-07-05 DIAGNOSIS — C672 Malignant neoplasm of lateral wall of bladder: Secondary | ICD-10-CM | POA: Diagnosis not present

## 2022-07-09 DIAGNOSIS — R8271 Bacteriuria: Secondary | ICD-10-CM | POA: Diagnosis not present

## 2022-07-14 ENCOUNTER — Telehealth: Payer: Self-pay | Admitting: *Deleted

## 2022-07-14 DIAGNOSIS — R8271 Bacteriuria: Secondary | ICD-10-CM | POA: Diagnosis not present

## 2022-07-14 DIAGNOSIS — R300 Dysuria: Secondary | ICD-10-CM | POA: Diagnosis not present

## 2022-07-14 DIAGNOSIS — C672 Malignant neoplasm of lateral wall of bladder: Secondary | ICD-10-CM | POA: Diagnosis not present

## 2022-07-14 NOTE — Telephone Encounter (Signed)
Wife called to report patient had high level of protein in his urine and his lymphs were elevated. Asking if this is of concern. This was per UA/culture with Dr. Gilford Rile at [facility]. Informed her we will get the printout and evaluate and call afterwards. [facility] and requested his UA results. Was informed the culture was negative.

## 2022-07-15 NOTE — Telephone Encounter (Signed)
Dr. Benay Spice reviewed UA results sent by [facility]: nothing is of concern to him. Mrs. Chad Flores notified.

## 2022-08-12 ENCOUNTER — Inpatient Hospital Stay: Payer: Medicare Other | Admitting: Oncology

## 2022-08-12 ENCOUNTER — Inpatient Hospital Stay: Payer: Medicare Other

## 2022-08-15 ENCOUNTER — Emergency Department (HOSPITAL_BASED_OUTPATIENT_CLINIC_OR_DEPARTMENT_OTHER): Payer: Medicare Other

## 2022-08-15 ENCOUNTER — Encounter (HOSPITAL_BASED_OUTPATIENT_CLINIC_OR_DEPARTMENT_OTHER): Payer: Self-pay

## 2022-08-15 ENCOUNTER — Emergency Department (HOSPITAL_BASED_OUTPATIENT_CLINIC_OR_DEPARTMENT_OTHER)
Admission: EM | Admit: 2022-08-15 | Discharge: 2022-08-15 | Disposition: A | Discharge status: Home / Self Care | Payer: Medicare Other | Attending: Emergency Medicine | Admitting: Emergency Medicine

## 2022-08-15 DIAGNOSIS — Z7982 Long term (current) use of aspirin: Secondary | ICD-10-CM | POA: Diagnosis not present

## 2022-08-15 DIAGNOSIS — M5459 Other low back pain: Secondary | ICD-10-CM | POA: Diagnosis not present

## 2022-08-15 DIAGNOSIS — M545 Low back pain, unspecified: Secondary | ICD-10-CM | POA: Insufficient documentation

## 2022-08-15 DIAGNOSIS — M47816 Spondylosis without myelopathy or radiculopathy, lumbar region: Secondary | ICD-10-CM | POA: Diagnosis not present

## 2022-08-15 MED ORDER — DIAZEPAM 2 MG PO TABS
2.0000 mg | ORAL_TABLET | Freq: Once | ORAL | Status: AC
  Administered 2022-08-15: 2 mg via ORAL
  Filled 2022-08-15: qty 1

## 2022-08-15 MED ORDER — KETOROLAC TROMETHAMINE 15 MG/ML IJ SOLN
15.0000 mg | Freq: Once | INTRAMUSCULAR | Status: AC
  Administered 2022-08-15: 15 mg via INTRAMUSCULAR
  Filled 2022-08-15: qty 1

## 2022-08-15 MED ORDER — ACETAMINOPHEN 500 MG PO TABS
1000.0000 mg | ORAL_TABLET | Freq: Once | ORAL | Status: AC
  Administered 2022-08-15: 1000 mg via ORAL
  Filled 2022-08-15: qty 2

## 2022-08-15 MED ORDER — OXYCODONE HCL 5 MG PO TABS
5.0000 mg | ORAL_TABLET | Freq: Once | ORAL | Status: AC
  Administered 2022-08-15: 5 mg via ORAL
  Filled 2022-08-15: qty 1

## 2022-08-15 NOTE — ED Notes (Signed)
Understood no driving, drinking, operating heavy equipment for the rest of the day due to the meds that were given.Marland KitchenMarland Kitchen

## 2022-08-15 NOTE — ED Notes (Signed)
Discharge paperwork given and verbally understood. 

## 2022-08-15 NOTE — ED Provider Notes (Signed)
[facility] EMERGENCY DEPARTMENT AT [facility] Provider Note   CSN: [id number] Arrival date & time: 08/15/22  1011     History  Chief Complaint  Patient presents with   Hip Pain    Chad Flores is a 72 y.o. male.  72 yo M with a chief complaints of right-sided low back pain.  This been going on for a couple days after he had help to lift some things that were not felt to be very heavy.  Pain worse with movement twisting palpation.  He is concerned because he has known cancer and has had a fracture due to the cancer in the past.  He denies loss of bowel or bladder denies loss of rectal sensation denies numbness or weakness to the leg.   Hip Pain       Home Medications Prior to Admission medications   Medication Sig Start Date End Date Taking? Authorizing Provider  acetaminophen (TYLENOL) 500 MG tablet Take 500 mg by mouth every 6 (six) hours as needed for mild pain or headache.    [provider]  albuterol (VENTOLIN HFA) 108 (90 Base) MCG/ACT inhaler Inhale 2 puffs into the lungs every 6 (six) hours as needed for wheezing or shortness of breath.    [provider]  ascorbic acid (VITAMIN C) 500 MG tablet Take 500 mg by mouth daily.    [provider]  aspirin EC 81 MG tablet Take 81 mg by mouth daily.    [provider]  atorvastatin (LIPITOR) 10 MG tablet Take 10 mg by mouth daily. 01/26/17   [provider]  buPROPion (WELLBUTRIN XL) 150 MG 24 hr tablet Take 150 mg by mouth daily. 01/26/17   [provider]  calcium carbonate (TUMS - DOSED IN MG ELEMENTAL CALCIUM) 500 MG chewable tablet Chew 1 tablet by mouth as needed for indigestion or heartburn.    [provider]  cholecalciferol (VITAMIN D3) 25 MCG (1000 UNIT) tablet Take 1,000 Units by mouth daily with breakfast.    [provider]  furosemide (LASIX) 20 MG tablet Take 20 mg by mouth daily as needed. 12/21/21   [provider]   NONFORMULARY OR COMPOUNDED ITEM Callous/Corn Cream: Salicylic acid 20%, Urea 40% Cream Order faxed to [facility] Patient taking differently: Apply 1 application  topically See admin instructions. Callous/Corn Cream: Salicylic acid 20%, Urea 40% Cream- Apply to affected corns once a day before bandaging as needed 01/17/20   Vivi Barrack, DPM  tamsulosin (FLOMAX) 0.4 MG CAPS capsule Take 1 capsule (0.4 mg total) by mouth daily. Patient taking differently: Take 0.4 mg by mouth in the morning and at bedtime. 11/26/20   Rene Paci, MD  vitamin B-12 (CYANOCOBALAMIN) 1000 MCG tablet Take 1,000 mcg by mouth daily.    [provider]  zinc gluconate 50 MG tablet Take 50 mg by mouth daily.    [provider]      Allergies    Penicillin g    Review of Systems   Review of Systems  Physical Exam Updated Vital Signs BP 107/64 (BP Location: Right Arm)   Pulse 64   Temp 97.7 F (36.5 C) (Oral)   Resp 16   Ht 5\' 10"  (1.778 m)   Wt 89.4 kg   SpO2 96%   BMI 28.27 kg/m  Physical Exam Vitals and nursing note reviewed.  Constitutional:      Appearance: He is well-developed.  HENT:     Head: Normocephalic  and atraumatic.  Eyes:     Pupils: Pupils are equal, round, and reactive to light.  Neck:     Vascular: No JVD.  Cardiovascular:     Rate and Rhythm: Normal rate and regular rhythm.     Heart sounds: No murmur heard.    No friction rub. No gallop.  Pulmonary:     Effort: No respiratory distress.     Breath sounds: No wheezing.  Abdominal:     General: There is no distension.     Tenderness: There is no abdominal tenderness. There is no guarding or rebound.  Musculoskeletal:        General: Normal range of motion.     Cervical back: Normal range of motion and neck supple.     Comments: No obvious midline spinal tenderness step-offs or deformities.  He is pain to the SI joint area on the right.  Pulse motor and sensation intact distally.   Negative straight leg raise test.  Reflexes 2+ and equal.  No clonus.  Skin:    Coloration: Skin is not pale.     Findings: No rash.  Neurological:     Mental Status: He is alert and oriented to person, place, and time.  Psychiatric:        Behavior: Behavior normal.     ED Results / Procedures / Treatments   Labs (all labs ordered are listed, but only abnormal results are displayed) Labs Reviewed - No data to display  EKG None  Radiology CT Lumbar Spine Wo Contrast  Result Date: 08/15/2022 CLINICAL DATA:  Low back pain. History of L4 plasmacytoma. History of noninvasive bladder cancer. EXAM: CT LUMBAR SPINE WITHOUT CONTRAST TECHNIQUE: Multidetector CT imaging of the lumbar spine was performed without intravenous contrast administration. Multiplanar CT image reconstructions were also generated. RADIATION DOSE REDUCTION: This exam was performed according to the departmental dose-optimization program which includes automated exposure control, adjustment of the mA and/or kV according to patient size and/or use of iterative reconstruction technique. COMPARISON:  MRI 05/26/2022.  PET-CT 06/18/2022 FINDINGS: Segmentation: 5 lumbar type vertebrae. Alignment: Normal. Vertebrae: Post treatment changes to the L4 vertebral body related to tumor ablation and kyphoplasty. Erosion of the inferior endplate of L4 is not appreciably changed compared to prior. No new pathologic fracture of the L4 vertebral body. No CT correlate to correspond to the previously seen subcentimeter lesion in the T12 vertebral body on prior MRI. There are no new lytic or sclerotic bone lesions identified. No acute fracture. Paraspinal and other soft tissues: Abdominal aortic atherosclerosis. Punctate nonobstructing right-sided nephrolithiasis. Disc levels: Mild disc height loss at L4-L5. Mild lower lumbar facet arthropathy. IMPRESSION: 1. Post treatment changes to the L4 vertebral body related to tumor ablation and kyphoplasty. No new  pathologic fracture of the L4 vertebral body. 2. No CT correlate to correspond to the previously seen subcentimeter lesion in the T12 vertebral body on prior MRI. No new lytic or sclerotic bone lesions identified. 3. Aortic atherosclerosis (ICD10-I70.0). Electronically Signed   By: Duanne Guess D.O.   On: 08/15/2022 11:32    Procedures Procedures    Medications Ordered in ED Medications  acetaminophen (TYLENOL) tablet 1,000 mg (1,000 mg Oral Given 08/15/22 1148)  ketorolac (TORADOL) 15 MG/ML injection 15 mg (15 mg Intramuscular Given 08/15/22 1150)  oxyCODONE (Oxy IR/ROXICODONE) immediate release tablet 5 mg (5 mg Oral Given 08/15/22 1150)  diazepam (VALIUM) tablet 2 mg (2 mg Oral Given 08/15/22 1148)    ED Course/ Medical Decision Making/  A&P                             Medical Decision Making Amount and/or Complexity of Data Reviewed Radiology: ordered.  Risk OTC drugs. Prescription drug management.   72 yo M with a chief complaints of right-sided low back pain.  The patient has a history of a spontaneous spinal fracture secondary to metastasis.  He is concerned that similar has occurred.  Therefore I will obtain a CT scan of the L-spine.  Treat pain.  Reassess.  Patient feeling a bit better on repeat assessment.  CT of the L-spine without acute change.  Will discharge home.  PCP and oncology follow-up.  11:51 AM:  I have discussed the diagnosis/risks/treatment options with the patient.  Evaluation and diagnostic testing in the emergency department does not suggest an emergent condition requiring admission or immediate intervention beyond what has been performed at this time.  They will follow up with PCP. We also discussed returning to the ED immediately if new or worsening sx occur. We discussed the sx which are most concerning (e.g., sudden worsening pain, fever, inability to tolerate by mouth) that necessitate immediate return. Medications administered to the patient during their  visit and any new prescriptions provided to the patient are listed below.  Medications given during this visit Medications  acetaminophen (TYLENOL) tablet 1,000 mg (1,000 mg Oral Given 08/15/22 1148)  ketorolac (TORADOL) 15 MG/ML injection 15 mg (15 mg Intramuscular Given 08/15/22 1150)  oxyCODONE (Oxy IR/ROXICODONE) immediate release tablet 5 mg (5 mg Oral Given 08/15/22 1150)  diazepam (VALIUM) tablet 2 mg (2 mg Oral Given 08/15/22 1148)     The patient appears reasonably screen and/or stabilized for discharge and I doubt any other medical condition or other [facility] requiring further screening, evaluation, or treatment in the ED at this time prior to discharge.          Final Clinical Impression(s) / ED Diagnoses Final diagnoses:  Acute right-sided low back pain without sciatica    Rx / DC Orders ED Discharge Orders     None         Melene PlanFloyd, Brentley Horrell, DO 08/15/22 1151

## 2022-08-15 NOTE — ED Triage Notes (Signed)
He c/o non-traumatic, non-radiating right hip area pain for the past few days. He cites recently assisting his daughter's moving, during which he lifted a lot of things. He is ambulatory and in no distress. His wife is with him.

## 2022-08-15 NOTE — Discharge Instructions (Signed)

## 2022-08-16 DIAGNOSIS — Z8551 Personal history of malignant neoplasm of bladder: Secondary | ICD-10-CM | POA: Diagnosis not present

## 2022-08-16 DIAGNOSIS — N401 Enlarged prostate with lower urinary tract symptoms: Secondary | ICD-10-CM | POA: Diagnosis not present

## 2022-08-16 DIAGNOSIS — R3915 Urgency of urination: Secondary | ICD-10-CM | POA: Diagnosis not present

## 2022-08-19 ENCOUNTER — Inpatient Hospital Stay (HOSPITAL_BASED_OUTPATIENT_CLINIC_OR_DEPARTMENT_OTHER): Payer: Medicare Other | Admitting: Oncology

## 2022-08-19 ENCOUNTER — Inpatient Hospital Stay: Payer: Medicare Other | Attending: Oncology

## 2022-08-19 VITALS — BP 111/67 | HR 74 | Temp 98.1°F | Resp 18 | Ht 70.0 in | Wt 197.8 lb

## 2022-08-19 DIAGNOSIS — J449 Chronic obstructive pulmonary disease, unspecified: Secondary | ICD-10-CM | POA: Insufficient documentation

## 2022-08-19 DIAGNOSIS — Z8551 Personal history of malignant neoplasm of bladder: Secondary | ICD-10-CM | POA: Insufficient documentation

## 2022-08-19 DIAGNOSIS — M549 Dorsalgia, unspecified: Secondary | ICD-10-CM | POA: Diagnosis not present

## 2022-08-19 DIAGNOSIS — C9030 Solitary plasmacytoma not having achieved remission: Secondary | ICD-10-CM | POA: Insufficient documentation

## 2022-08-19 DIAGNOSIS — Z87891 Personal history of nicotine dependence: Secondary | ICD-10-CM | POA: Insufficient documentation

## 2022-08-19 LAB — CBC WITH DIFFERENTIAL (CANCER CENTER ONLY)
Abs Immature Granulocytes: 0.01 10*3/uL (ref 0.00–0.07)
Basophils Absolute: 0 10*3/uL (ref 0.0–0.1)
Basophils Relative: 1 %
Eosinophils Absolute: 0.2 10*3/uL (ref 0.0–0.5)
Eosinophils Relative: 4 %
HCT: 44.1 % (ref 39.0–52.0)
Hemoglobin: 14.9 g/dL (ref 13.0–17.0)
Immature Granulocytes: 0 %
Lymphocytes Relative: 27 %
Lymphs Abs: 1.2 10*3/uL (ref 0.7–4.0)
MCH: 30.5 pg (ref 26.0–34.0)
MCHC: 33.8 g/dL (ref 30.0–36.0)
MCV: 90.2 fL (ref 80.0–100.0)
Monocytes Absolute: 0.3 10*3/uL (ref 0.1–1.0)
Monocytes Relative: 8 %
Neutro Abs: 2.7 10*3/uL (ref 1.7–7.7)
Neutrophils Relative %: 60 %
Platelet Count: 197 10*3/uL (ref 150–400)
RBC: 4.89 MIL/uL (ref 4.22–5.81)
RDW: 12.3 % (ref 11.5–15.5)
WBC Count: 4.4 10*3/uL (ref 4.0–10.5)
nRBC: 0 % (ref 0.0–0.2)

## 2022-08-19 LAB — CMP (CANCER CENTER ONLY)
ALT: 14 U/L (ref 0–44)
AST: 19 U/L (ref 15–41)
Albumin: 4.2 g/dL (ref 3.5–5.0)
Alkaline Phosphatase: 125 U/L (ref 38–126)
Anion gap: 8 (ref 5–15)
BUN: 15 mg/dL (ref 8–23)
CO2: 26 mmol/L (ref 22–32)
Calcium: 9.4 mg/dL (ref 8.9–10.3)
Chloride: 107 mmol/L (ref 98–111)
Creatinine: 0.9 mg/dL (ref 0.61–1.24)
GFR, Estimated: >60 mL/min (ref >60)
Glucose, Bld: 108 mg/dL — ABNORMAL HIGH (ref 70–99)
Potassium: 4.2 mmol/L (ref 3.5–5.1)
Sodium: 141 mmol/L (ref 135–145)
Total Bilirubin: 0.8 mg/dL (ref 0.3–1.2)
Total Protein: 6.7 g/dL (ref 6.5–8.1)

## 2022-08-19 NOTE — Progress Notes (Signed)
[facility] OFFICE PROGRESS NOTE   Diagnosis: Plasmacytoma  INTERVAL HISTORY:   Chad Flores returns as scheduled.  He generally feels well.  He developed pain at the right lower back beginning approximately 2 weeks ago.  The pain is present with certain movement and last for seconds.  No other pain.  No fever or recent infection. He was seen in the emergency room 08/15/2022.  A CT of the lumbar spine showed posttreatment change at L4.  No CT correlate to the previously seen subcentimeter lesion at T12.  No new lytic or sclerotic bone lesions.  No fracture.  He takes Tylenol as needed. He reports helping his daughter move prior to the onset of the current pain.  He reports he has completed BCG therapy for treatment of noninvasive urothelial carcinoma. Objective:  Vital signs in last 24 hours:  Blood pressure 111/67, pulse 74, temperature 98.1 F (36.7 C), resp. rate 18, height 5\' 10"  (1.778 m), weight 197 lb 12.8 oz (89.7 kg), SpO2 100 %.     Resp: Lungs clear bilaterally Cardio: Regular rate and rhythm GI: No hepatosplenomegaly Vascular: No leg edema Musculoskeletal: Tender at the right posterior iliac a few centimeters lateral to the line.  No mass.   Lab Results:  Lab Results  Component Value Date   WBC 4.4 08/19/2022   HGB 14.9 08/19/2022   HCT 44.1 08/19/2022   MCV 90.2 08/19/2022   PLT 197 08/19/2022   NEUTROABS 2.7 08/19/2022    CMP  Lab Results  Component Value Date   NA 141 08/19/2022   K 4.2 08/19/2022   CL 107 08/19/2022   CO2 26 08/19/2022   GLUCOSE 108 (H) 08/19/2022   BUN 15 08/19/2022   CREATININE 0.90 08/19/2022   CALCIUM 9.4 08/19/2022   PROT 6.7 08/19/2022   ALBUMIN 4.2 08/19/2022   AST 19 08/19/2022   ALT 14 08/19/2022   ALKPHOS 125 08/19/2022   BILITOT 0.8 08/19/2022   GFRNONAA >60 08/19/2022   GFRAA >60 03/24/2017    Imaging:  CT Lumbar Spine Wo Contrast  Result Date: 08/15/2022 CLINICAL DATA:  Low back pain. History  of L4 plasmacytoma. History of noninvasive bladder cancer. EXAM: CT LUMBAR SPINE WITHOUT CONTRAST TECHNIQUE: Multidetector CT imaging of the lumbar spine was performed without intravenous contrast administration. Multiplanar CT image reconstructions were also generated. RADIATION DOSE REDUCTION: This exam was performed according to the departmental dose-optimization program which includes automated exposure control, adjustment of the mA and/or kV according to patient size and/or use of iterative reconstruction technique. COMPARISON:  MRI 05/26/2022.  PET-CT 06/18/2022 FINDINGS: Segmentation: 5 lumbar type vertebrae. Alignment: Normal. Vertebrae: Post treatment changes to the L4 vertebral body related to tumor ablation and kyphoplasty. Erosion of the inferior endplate of L4 is not appreciably changed compared to prior. No new pathologic fracture of the L4 vertebral body. No CT correlate to correspond to the previously seen subcentimeter lesion in the T12 vertebral body on prior MRI. There are no new lytic or sclerotic bone lesions identified. No acute fracture. Paraspinal and other soft tissues: Abdominal aortic atherosclerosis. Punctate nonobstructing right-sided nephrolithiasis. Disc levels: Mild disc height loss at L4-L5. Mild lower lumbar facet arthropathy. IMPRESSION: 1. Post treatment changes to the L4 vertebral body related to tumor ablation and kyphoplasty. No new pathologic fracture of the L4 vertebral body. 2. No CT correlate to correspond to the previously seen subcentimeter lesion in the T12 vertebral body on prior MRI. No new lytic or sclerotic bone lesions identified.  3. Aortic atherosclerosis (ICD10-I70.0). Electronically Signed   By: Duanne Guess D.O.   On: 08/15/2022 11:32    Medications: I have reviewed the patient's current medications.   Assessment/Plan: L4 plasmacytoma MRI lumbar spine 11/25/2021-abnormal marrow signal at L4, moderate loss of inferior endplate height, mild enhancement  at the left L5 pedicle and adjacent to left L4-5 facets favored to reflect arthropathy L4 biopsy 12/17/2021-plasmacytoma, kappa light chain restricted Bone survey 12/28/2021-L4 kyphoplasty, no other lytic or sclerotic lesion Normal immunoglobulin levels 12/28/2021 0.4 g serum M spike Serum immunofixation-monoclonal IgG kappa protein Mild elevation of serum free kappa light chains, normal ratio Bone marrow biopsy 01/08/2022-kappa restricted plasma cells involving less than 10% of the cellular marrow with plasma cells measured at 3% on 500 cell count 46 XY, myeloma FISH panel negative Radiation to L4 01/18/2022 - 02/12/2022 Lumbar MRI 05/26/2022-posttreatment changes at L4, new 5 mm lesion at T12 suspicious for a "metastasis " PET scan 06/18/2022-no hypermetabolic osseous disease to correlate with the T12 lesion.  No L4 hypermetabolism, no evidence of hypermetabolic metastatic disease CT lumbar spine 08/15/2022-posttreatment changes at L4, no new pathologic fracture at L4, no new lytic or sclerotic bone lesions, T12 lesion noted on MRI not seen Back pain secondary to #1 L4 kyphoplasty and ablation 12/17/2021 3.  Noninvasive urothelial carcinoma of the bladder-status post TURBT 11/26/2020 followed by induction and maintenance BCG therapy 4.  COPD 5.  History of tobacco use-followed in the lung cancer screening program        Disposition: Chad Flores has a history of an L4 plasmacytoma.  There is no clinical evidence for progression to multiple myeloma.  We will follow-up on the myeloma panel from today. The acute onset right lower back discomfort acute onset right lower back discomfort may be related to a benign musculoskeletal condition after helping his daughter move.  He will call if the pain does not improve over the next few weeks.  We will consider obtaining an MRI of the pelvis.  He continues follow-up with urology for management of the noninvasive urothelial carcinoma.  [name],  MD  08/19/2022  10:23 AM

## 2022-08-19 NOTE — Progress Notes (Deleted)
[facility] OFF[facility] PROGRESS [facility]   Diagnosis:   INTERVAL HISTORY:   ***  Objective:  Vital signs in last 24 hours:  Blood pressure 111/67, pulse 74, temperature 98.1 F (36.7 C), resp. rate 18, height 5\' 10"  (1.778 m), weight 197 lb 12.8 oz (89.7 kg), SpO2 100 %.    HEENT: *** Lymphatics: *** Resp: *** Cardio: *** GI: *** Vascular: *** Neuro:***  Skin:***   Portacath/PICC-without erythema  Lab Results:  Lab Results  Component Value Date   WBC 4.4 08/19/2022   HGB 14.9 08/19/2022   HCT 44.1 08/19/2022   MCV 90.2 08/19/2022   PLT 197 08/19/2022   NEUTROABS 2.7 08/19/2022    CMP  Lab Results  Component Value Date   NA 141 08/19/2022   K 4.2 08/19/2022   CL 107 08/19/2022   CO2 26 08/19/2022   GLUCOSE 108 (H) 08/19/2022   BUN 15 08/19/2022   CREATININE 0.90 08/19/2022   CALCIUM 9.4 08/19/2022   PROT 6.7 08/19/2022   ALBUMIN 4.2 08/19/2022   AST 19 08/19/2022   ALT 14 08/19/2022   ALKPHOS 125 08/19/2022   BILITOT 0.8 08/19/2022   GFRNONAA >60 08/19/2022   GFRAA >60 03/24/2017    No results found for: "CEA1", "CEA", "HUD149", "CA125"  Lab Results  Component Value Date   INR 0.9 12/17/2021   LABPROT 12.2 12/17/2021    Imaging:  CT Lumbar Spine Wo Contrast  Result Date: 08/15/2022 CLINICAL DATA:  Low back pain. History of L4 plasmacytoma. History of noninvasive bladder cancer. EXAM: CT LUMBAR SPINE WITHOUT CONTRAST TECHNIQUE: Multidetector CT imaging of the lumbar spine was performed without intravenous contrast administration. Multiplanar CT image reconstructions were also generated. RADIATION DOSE REDUCTION: This exam was performed according to the departmental dose-optimization program which includes automated exposure control, adjustment of the mA and/or kV according to patient size and/or use of iterative reconstruction technique. COMPARISON:  MRI 05/26/2022.  PET-CT 06/18/2022 FINDINGS: Segmentation: 5 lumbar type vertebrae.  Alignment: Normal. Vertebrae: Post treatment changes to the L4 vertebral body related to tumor ablation and kyphoplasty. Erosion of the inferior endplate of L4 is not appreciably changed compared to prior. No new pathologic fracture of the L4 vertebral body. No CT correlate to correspond to the previously seen subcentimeter lesion in the T12 vertebral body on prior MRI. There are no new lytic or sclerotic bone lesions identified. No acute fracture. Paraspinal and other soft tissues: Abdominal aortic atherosclerosis. Punctate nonobstructing right-sided nephrolithiasis. Disc levels: Mild disc height loss at L4-L5. Mild lower lumbar facet arthropathy. IMPRESSION: 1. Post treatment changes to the L4 vertebral body related to tumor ablation and kyphoplasty. No new pathologic fracture of the L4 vertebral body. 2. No CT correlate to correspond to the previously seen subcentimeter lesion in the T12 vertebral body on prior MRI. No new lytic or sclerotic bone lesions identified. 3. Aortic atherosclerosis (ICD10-I70.0). Electronically Signed   By: Duanne Guess D.O.   On: 08/15/2022 11:32    Medications: I have reviewed the patient's current medications.   Assessment/Plan: L4 plasmacytoma MRI lumbar spine 11/25/2021-abnormal marrow signal at L4, moderate loss of inferior endplate height, mild enhancement at the left L5 pedicle and adjacent to left L4-5 facets favored to reflect arthropathy L4 biopsy 12/17/2021-plasmacytoma, kappa light chain restricted Bone survey 12/28/2021-L4 kyphoplasty, no other lytic or sclerotic lesion Normal immunoglobulin levels 12/28/2021 0.4 g serum M spike Serum immunofixation-monoclonal IgG kappa protein Mild elevation of serum free kappa light chains, normal ratio Bone marrow biopsy  01/08/2022-kappa restricted plasma cells involving less than 10% of the cellular marrow with plasma cells measured at 3% on 500 cell count 46 XY, myeloma FISH panel negative Radiation to L4 01/18/2022 -  02/12/2022 Lumbar MRI 05/26/2022-posttreatment changes at L4, new 5 mm lesion at T12 suspicious for a "metastasis " PET scan 06/18/2022-no hypermetabolic osseous disease to correlate with the T12 lesion.  No L4 hypermetabolism, no evidence of hypermetabolic metastatic disease Back pain secondary to #1 L4 kyphoplasty and ablation 12/17/2021 3.  Noninvasive urothelial carcinoma of the bladder-status post TURBT 11/26/2020 followed by induction and maintenance BCG therapy 4.  COPD 5.  History of tobacco use-followed in the lung cancer screening program      Disposition: ***  [name], MD  08/19/2022  10:02 AM

## 2022-08-20 LAB — KAPPA/LAMBDA LIGHT CHAINS
Kappa free light chain: 26.7 mg/L — ABNORMAL HIGH (ref 3.3–19.4)
Kappa, lambda light chain ratio: 2.09 — ABNORMAL HIGH (ref 0.26–1.65)
Lambda free light chains: 12.8 mg/L (ref 5.7–26.3)

## 2022-08-21 LAB — IGG: IgG (Immunoglobin G), Serum: 1001 mg/dL (ref 603–1613)

## 2022-08-23 LAB — PROTEIN ELECTROPHORESIS, SERUM
A/G Ratio: 1.2 (ref 0.7–1.7)
Albumin ELP: 3.6 g/dL (ref 2.9–4.4)
Alpha-1-Globulin: 0.2 g/dL (ref 0.0–0.4)
Alpha-2-Globulin: 0.9 g/dL (ref 0.4–1.0)
Beta Globulin: 0.8 g/dL (ref 0.7–1.3)
Gamma Globulin: 1 g/dL (ref 0.4–1.8)
Globulin, Total: 2.9 g/dL (ref 2.2–3.9)
M-Spike, %: 0.4 g/dL — ABNORMAL HIGH
Total Protein ELP: 6.5 g/dL (ref 6.0–8.5)

## 2022-09-02 ENCOUNTER — Telehealth: Payer: Self-pay

## 2022-09-02 NOTE — Telephone Encounter (Signed)
The patient contacted our office to inform us that he is still experiencing lower back pain. He is requesting that Dr. Truett Perna schedule an MRI per their discussion during his last appointment.

## 2022-09-04 ENCOUNTER — Other Ambulatory Visit: Payer: Self-pay | Admitting: Oncology

## 2022-09-06 ENCOUNTER — Inpatient Hospital Stay (HOSPITAL_BASED_OUTPATIENT_CLINIC_OR_DEPARTMENT_OTHER): Payer: Medicare Other | Admitting: Nurse Practitioner

## 2022-09-06 ENCOUNTER — Encounter: Payer: Self-pay | Admitting: Nurse Practitioner

## 2022-09-06 VITALS — BP 107/60 | HR 88 | Temp 98.2°F | Resp 18 | Ht 70.0 in | Wt 196.4 lb

## 2022-09-06 DIAGNOSIS — C9030 Solitary plasmacytoma not having achieved remission: Secondary | ICD-10-CM | POA: Diagnosis not present

## 2022-09-06 DIAGNOSIS — J449 Chronic obstructive pulmonary disease, unspecified: Secondary | ICD-10-CM | POA: Diagnosis not present

## 2022-09-06 DIAGNOSIS — M549 Dorsalgia, unspecified: Secondary | ICD-10-CM | POA: Diagnosis not present

## 2022-09-06 DIAGNOSIS — Z8551 Personal history of malignant neoplasm of bladder: Secondary | ICD-10-CM | POA: Diagnosis not present

## 2022-09-06 DIAGNOSIS — Z87891 Personal history of nicotine dependence: Secondary | ICD-10-CM | POA: Diagnosis not present

## 2022-09-06 NOTE — Progress Notes (Signed)
  [facility] OFFICE PROGRESS NOTE   Diagnosis: Plasmacytoma  INTERVAL HISTORY:   Chad Flores returns prior to scheduled follow-up.  Last office visit was 08/19/2022.  He reported recent pain at the right lower back.  He contacted the office 09/01/2022 to report continued low back pain and requesting an MRI.  He reports continued pain at the right low back.  He describes the pain as "sharp" with certain activities.  The pain increases with certain sleep positions.  No leg weakness or numbness.  No bowel or bladder dysfunction.  Objective:  Vital signs in last 24 hours:  Blood pressure 107/60, pulse 88, temperature 98.2 F (36.8 C), temperature source Oral, resp. rate 18, height 5\' 10"  (1.778 m), weight 196 lb 6.4 oz (89.1 kg), SpO2 98 %.    Resp: Lungs clear bilaterally. Cardio: Regular rate and rhythm. GI: No hepatosplenomegaly. Vascular: No leg edema. Musculoskeletal: Tender just lateral to the spine and inferior to the posterior iliac.  Lab Results:  Lab Results  Component Value Date   WBC 4.4 08/19/2022   HGB 14.9 08/19/2022   HCT 44.1 08/19/2022   MCV 90.2 08/19/2022   PLT 197 08/19/2022   NEUTROABS 2.7 08/19/2022    Imaging:  No results found.  Medications: I have reviewed the patient's current medications.  Assessment/Plan: L4 plasmacytoma MRI lumbar spine 11/25/2021-abnormal marrow signal at L4, moderate loss of inferior endplate height, mild enhancement at the left L5 pedicle and adjacent to left L4-5 facets favored to reflect arthropathy L4 biopsy 12/17/2021-plasmacytoma, kappa light chain restricted Bone survey 12/28/2021-L4 kyphoplasty, no other lytic or sclerotic lesion Normal immunoglobulin levels 12/28/2021 0.4 g serum M spike Serum immunofixation-monoclonal IgG kappa protein Mild elevation of serum free kappa light chains, normal ratio Bone marrow biopsy 01/08/2022-kappa restricted plasma cells involving less than 10% of the cellular marrow  with plasma cells measured at 3% on 500 cell count 46 XY, myeloma FISH panel negative Radiation to L4 01/18/2022 - 02/12/2022 Lumbar MRI 05/26/2022-posttreatment changes at L4, new 5 mm lesion at T12 suspicious for a "metastasis " PET scan 06/18/2022-no hypermetabolic osseous disease to correlate with the T12 lesion.  No L4 hypermetabolism, no evidence of hypermetabolic metastatic disease CT lumbar spine 08/15/2022-posttreatment changes at L4, no new pathologic fracture at L4, no new lytic or sclerotic bone lesions, T12 lesion noted on MRI not seen Back pain secondary to #1 L4 kyphoplasty and ablation 12/17/2021 3.  Noninvasive urothelial carcinoma of the bladder-status post TURBT 11/26/2020 followed by induction and maintenance BCG therapy 4.  COPD 5.  History of tobacco use-followed in the lung cancer screening program  Disposition: Mr. Oleski has history of an L4 plasmacytoma.  He has persistent right low back pain.  We are referring him for a pelvic MRI.  We will schedule his next follow-up pending the MRI result.  Patient seen with Dr. Truett Perna.   Lonna Cobb ANP/GNP-BC   09/06/2022  2:01 PM  This was a shared visit with Lonna Cobb.  Mr. Fahl was interviewed and examined.  He has increased pain at the right lower back.  The pain appear and tenderness appear to localize to the upper iliac and sacrum.  He will be referred for a pelvic MRI.  I was present for greater than 50% of today's visit.  I performed medical decision making.  Mancel Bale, MD

## 2022-09-08 DIAGNOSIS — L821 Other seborrheic keratosis: Secondary | ICD-10-CM | POA: Diagnosis not present

## 2022-09-08 DIAGNOSIS — L814 Other melanin hyperpigmentation: Secondary | ICD-10-CM | POA: Diagnosis not present

## 2022-09-08 DIAGNOSIS — Z08 Encounter for follow-up examination after completed treatment for malignant neoplasm: Secondary | ICD-10-CM | POA: Diagnosis not present

## 2022-09-08 DIAGNOSIS — Z85828 Personal history of other malignant neoplasm of skin: Secondary | ICD-10-CM | POA: Diagnosis not present

## 2022-09-08 DIAGNOSIS — D225 Melanocytic nevi of trunk: Secondary | ICD-10-CM | POA: Diagnosis not present

## 2022-09-08 DIAGNOSIS — T07XXXA Unspecified multiple injuries, initial encounter: Secondary | ICD-10-CM | POA: Diagnosis not present

## 2022-09-10 NOTE — Progress Notes (Signed)
  Radiation Oncology         [phone] ________________________________  Name: Chad Flores MRN: [id number]  Date: 02/26/2022  DOB: 03/20/1951  End of Treatment Note  Diagnosis:     72 y.o. gentleman with a solitary plasmacytoma in L4      Indication for treatment:  Curative       Radiation treatment dates:   01/18/22 - 02/27/23  Site/dose:   The solitary plasmacytoma lesion at L4 was treated to  42 Gy in 20 fractions.   Beams/energy:   A 3D conformal treatment arrangement was used delivering 10 and 15 MV photons.  Daily image-guidance CT was used to align the treatment with the targeted volume  Narrative: The patient tolerated radiation treatment relatively well with only mild fatigue.  Plan: The patient has completed radiation treatment. The patient will return to radiation oncology clinic for routine followup in one month. I advised him to call or return sooner if he has any questions or concerns related to his recovery or treatment. ________________________________  Artist Pais. Kathrynn Running, M.D.

## 2022-09-13 ENCOUNTER — Telehealth: Payer: Self-pay

## 2022-09-13 ENCOUNTER — Other Ambulatory Visit: Payer: Self-pay | Admitting: Nurse Practitioner

## 2022-09-13 DIAGNOSIS — C9030 Solitary plasmacytoma not having achieved remission: Secondary | ICD-10-CM

## 2022-09-13 MED ORDER — TRAMADOL HCL 50 MG PO TABS: 50.0000 mg | ORAL_TABLET | Freq: Three times a day (TID) | ORAL | 0 refills | Status: DC | PRN

## 2022-09-13 NOTE — Telephone Encounter (Signed)
Chad Flores contacted our office to request an adjustment in his MRI appointment and to request pain medication. I informed the patient that I am unable to alter his MRI appointment at [facility], and advised him to contact them directly to make any necessary changes. Additionally, I will notify Misty Stanley to arrange for the prescription of pain medication to address his ongoing back pain.

## 2022-09-23 ENCOUNTER — Ambulatory Visit
Admission: RE | Admit: 2022-09-23 | Discharge: 2022-09-23 | Disposition: A | Discharge status: Home / Self Care | Payer: Medicare Other | Source: Ambulatory Visit | Attending: Nurse Practitioner | Admitting: Nurse Practitioner

## 2022-09-23 DIAGNOSIS — M545 Low back pain, unspecified: Secondary | ICD-10-CM | POA: Diagnosis not present

## 2022-09-23 DIAGNOSIS — Z8579 Personal history of other malignant neoplasms of lymphoid, hematopoietic and related tissues: Secondary | ICD-10-CM | POA: Diagnosis not present

## 2022-09-23 DIAGNOSIS — Z8551 Personal history of malignant neoplasm of bladder: Secondary | ICD-10-CM | POA: Diagnosis not present

## 2022-09-23 DIAGNOSIS — C9030 Solitary plasmacytoma not having achieved remission: Secondary | ICD-10-CM

## 2022-09-23 DIAGNOSIS — K5730 Diverticulosis of large intestine without perforation or abscess without bleeding: Secondary | ICD-10-CM | POA: Diagnosis not present

## 2022-09-23 MED ORDER — GADOPICLENOL 0.5 MMOL/ML IV SOLN
10.0000 mL | Freq: Once | INTRAVENOUS | Status: AC | PRN
  Administered 2022-09-23: 10 mL via INTRAVENOUS

## 2022-09-27 ENCOUNTER — Telehealth: Payer: Self-pay

## 2022-09-27 NOTE — Telephone Encounter (Signed)
-----   Message from Ladene Artist, MD sent at 09/25/2022  7:52 PM EDT ----- Please call patient, the MRI shows no new site of myeloma, treated lesion at L4, f/u as scheduled

## 2022-09-27 NOTE — Telephone Encounter (Signed)
Attempted to reach via home and cell number per MD Sherrill. My chart message sent instead.

## 2022-10-05 ENCOUNTER — Other Ambulatory Visit: Payer: Self-pay | Admitting: Acute Care

## 2022-10-05 ENCOUNTER — Other Ambulatory Visit: Payer: Medicare Other

## 2022-10-05 DIAGNOSIS — Z122 Encounter for screening for malignant neoplasm of respiratory organs: Secondary | ICD-10-CM

## 2022-10-05 DIAGNOSIS — Z87891 Personal history of nicotine dependence: Secondary | ICD-10-CM

## 2022-10-21 ENCOUNTER — Inpatient Hospital Stay (HOSPITAL_BASED_OUTPATIENT_CLINIC_OR_DEPARTMENT_OTHER): Payer: Medicare Other | Admitting: Oncology

## 2022-10-21 ENCOUNTER — Inpatient Hospital Stay: Payer: Medicare Other | Attending: Oncology

## 2022-10-21 VITALS — BP 104/58 | HR 85 | Temp 98.2°F | Resp 18 | Ht 70.0 in | Wt 191.9 lb

## 2022-10-21 DIAGNOSIS — C679 Malignant neoplasm of bladder, unspecified: Secondary | ICD-10-CM | POA: Diagnosis not present

## 2022-10-21 DIAGNOSIS — Z87891 Personal history of nicotine dependence: Secondary | ICD-10-CM | POA: Insufficient documentation

## 2022-10-21 DIAGNOSIS — M545 Low back pain, unspecified: Secondary | ICD-10-CM | POA: Insufficient documentation

## 2022-10-21 DIAGNOSIS — C9030 Solitary plasmacytoma not having achieved remission: Secondary | ICD-10-CM

## 2022-10-21 DIAGNOSIS — J449 Chronic obstructive pulmonary disease, unspecified: Secondary | ICD-10-CM | POA: Diagnosis not present

## 2022-10-21 LAB — CMP (CANCER CENTER ONLY)
ALT: 14 U/L (ref 0–44)
AST: 15 U/L (ref 15–41)
Albumin: 4.1 g/dL (ref 3.5–5.0)
Alkaline Phosphatase: 133 U/L — ABNORMAL HIGH (ref 38–126)
Anion gap: 6 (ref 5–15)
BUN: 23 mg/dL (ref 8–23)
CO2: 25 mmol/L (ref 22–32)
Calcium: 8.9 mg/dL (ref 8.9–10.3)
Chloride: 109 mmol/L (ref 98–111)
Creatinine: 1.13 mg/dL (ref 0.61–1.24)
GFR, Estimated: >60 mL/min (ref >60)
Glucose, Bld: 119 mg/dL — ABNORMAL HIGH (ref 70–99)
Potassium: 3.9 mmol/L (ref 3.5–5.1)
Sodium: 140 mmol/L (ref 135–145)
Total Bilirubin: 0.5 mg/dL (ref 0.3–1.2)
Total Protein: 6.7 g/dL (ref 6.5–8.1)

## 2022-10-21 LAB — CBC WITH DIFFERENTIAL (CANCER CENTER ONLY)
Abs Immature Granulocytes: 0.02 10*3/uL (ref 0.00–0.07)
Basophils Absolute: 0 10*3/uL (ref 0.0–0.1)
Basophils Relative: 1 %
Eosinophils Absolute: 0.3 10*3/uL (ref 0.0–0.5)
Eosinophils Relative: 6 %
HCT: 42.7 % (ref 39.0–52.0)
Hemoglobin: 14.4 g/dL (ref 13.0–17.0)
Immature Granulocytes: 1 %
Lymphocytes Relative: 30 %
Lymphs Abs: 1.3 10*3/uL (ref 0.7–4.0)
MCH: 30.6 pg (ref 26.0–34.0)
MCHC: 33.7 g/dL (ref 30.0–36.0)
MCV: 90.9 fL (ref 80.0–100.0)
Monocytes Absolute: 0.3 10*3/uL (ref 0.1–1.0)
Monocytes Relative: 8 %
Neutro Abs: 2.5 10*3/uL (ref 1.7–7.7)
Neutrophils Relative %: 54 %
Platelet Count: 177 10*3/uL (ref 150–400)
RBC: 4.7 MIL/uL (ref 4.22–5.81)
RDW: 12.4 % (ref 11.5–15.5)
WBC Count: 4.4 10*3/uL (ref 4.0–10.5)
nRBC: 0 % (ref 0.0–0.2)

## 2022-10-21 NOTE — Progress Notes (Signed)
[facility] OFFICE PROGRESS NOTE   Diagnosis: Plasmacytoma  INTERVAL HISTORY:   Mr. Chad Flores returns as scheduled.  He continues to have pain at the sacrum.  The pain is intermittent.  Pain is worse with activity.  The lumbar pain remains improved.  He is working.  No other complaint.  Objective:  Vital signs in last 24 hours:  Blood pressure (!) 104/58, pulse 85, temperature 98.2 F (36.8 C), temperature source Oral, resp. rate 18, height 5\' 10"  (1.778 m), weight 191 lb 14.4 oz (87 kg), SpO2 98 %.    Lymphatics: No cervical, supraclavicular, axillary, or inguinal nodes Resp: Lungs clear bilaterally Cardio: Regular rate and rhythm GI: No hepatosplenomegaly Vascular: No leg edema Musculoskeletal: No tenderness at the sacrum or spine   Lab Results:  Lab Results  Component Value Date   WBC 4.4 10/21/2022   HGB 14.4 10/21/2022   HCT 42.7 10/21/2022   MCV 90.9 10/21/2022   PLT 177 10/21/2022   NEUTROABS 2.5 10/21/2022    CMP  Lab Results  Component Value Date   NA 140 10/21/2022   K 3.9 10/21/2022   CL 109 10/21/2022   CO2 25 10/21/2022   GLUCOSE 119 (H) 10/21/2022   BUN 23 10/21/2022   CREATININE 1.13 10/21/2022   CALCIUM 8.9 10/21/2022   PROT 6.7 10/21/2022   ALBUMIN 4.1 10/21/2022   AST 15 10/21/2022   ALT 14 10/21/2022   ALKPHOS 133 (H) 10/21/2022   BILITOT 0.5 10/21/2022   GFRNONAA >60 10/21/2022   GFRAA >60 03/24/2017     Medications: I have reviewed the patient's current medications.   Assessment/Plan: L4 plasmacytoma MRI lumbar spine 11/25/2021-abnormal marrow signal at L4, moderate loss of inferior endplate height, mild enhancement at the left L5 pedicle and adjacent to left L4-5 facets favored to reflect arthropathy L4 biopsy 12/17/2021-plasmacytoma, kappa light chain restricted Bone survey 12/28/2021-L4 kyphoplasty, no other lytic or sclerotic lesion Normal immunoglobulin levels 12/28/2021 0.4 g serum M spike Serum  immunofixation-monoclonal IgG kappa protein Mild elevation of serum free kappa light chains, normal ratio Bone marrow biopsy 01/08/2022-kappa restricted plasma cells involving less than 10% of the cellular marrow with plasma cells measured at 3% on 500 cell count 46 XY, myeloma FISH panel negative Radiation to L4 01/18/2022 - 02/12/2022 Lumbar MRI 05/26/2022-posttreatment changes at L4, new 5 mm lesion at T12 suspicious for a "metastasis " PET scan 06/18/2022-no hypermetabolic osseous disease to correlate with the T12 lesion.  No L4 hypermetabolism, no evidence of hypermetabolic metastatic disease CT lumbar spine 08/15/2022-posttreatment changes at L4, no new pathologic fracture at L4, no new lytic or sclerotic bone lesions, T12 lesion noted on MRI not seen MRI pelvis 09/23/2022-vertebral body ablation and augmentation of L4, no other suspicious osseous lesion Back pain secondary to #1 L4 kyphoplasty and ablation 12/17/2021 3.  Noninvasive urothelial carcinoma of the bladder-status post TURBT 11/26/2020 followed by induction and maintenance BCG therapy 4.  COPD 5.  History of tobacco use-followed in the lung cancer screening program    Disposition: Mr. [name] appears stable.  He continues to have lower back pain the pain localizes to the upper sacrum and i neck areas.  An MRI last month did not reveal evidence of new myeloma lesions.  The pain is likely related to a benign musculoskeletal condition.  We will follow-up on the myeloma panel from today.  He will call for increased pain. Mr. [name] will return for an office and lab visit in 3 months.  [name],  MD  10/21/2022  2:52 PM

## 2022-10-22 LAB — KAPPA/LAMBDA LIGHT CHAINS
Kappa free light chain: 26.2 mg/L — ABNORMAL HIGH (ref 3.3–19.4)
Kappa, lambda light chain ratio: 2.05 — ABNORMAL HIGH (ref 0.26–1.65)
Lambda free light chains: 12.8 mg/L (ref 5.7–26.3)

## 2022-10-27 LAB — MULTIPLE MYELOMA PANEL, SERUM
Albumin SerPl Elph-Mcnc: 3.6 g/dL (ref 2.9–4.4)
Albumin/Glob SerPl: 1.4 (ref 0.7–1.7)
Alpha 1: 0.2 g/dL (ref 0.0–0.4)
Alpha2 Glob SerPl Elph-Mcnc: 0.8 g/dL (ref 0.4–1.0)
B-Globulin SerPl Elph-Mcnc: 0.8 g/dL (ref 0.7–1.3)
Gamma Glob SerPl Elph-Mcnc: 0.9 g/dL (ref 0.4–1.8)
Globulin, Total: 2.7 g/dL (ref 2.2–3.9)
IgA: 70 mg/dL (ref 61–437)
IgG (Immunoglobin G), Serum: 971 mg/dL (ref 603–1613)
IgM (Immunoglobulin M), Srm: 46 mg/dL (ref 15–143)
M Protein SerPl Elph-Mcnc: 0.4 g/dL — ABNORMAL HIGH
Total Protein ELP: 6.3 g/dL (ref 6.0–8.5)

## 2022-11-09 ENCOUNTER — Ambulatory Visit
Admission: RE | Admit: 2022-11-09 | Discharge: 2022-11-09 | Disposition: A | Discharge status: Home / Self Care | Payer: Medicare Other | Source: Ambulatory Visit | Attending: Acute Care | Admitting: Acute Care

## 2022-11-09 DIAGNOSIS — Z87891 Personal history of nicotine dependence: Secondary | ICD-10-CM

## 2022-11-09 DIAGNOSIS — Z122 Encounter for screening for malignant neoplasm of respiratory organs: Secondary | ICD-10-CM

## 2022-11-12 ENCOUNTER — Telehealth: Payer: Self-pay | Admitting: Acute Care

## 2022-11-12 ENCOUNTER — Other Ambulatory Visit: Payer: Self-pay | Admitting: Nurse Practitioner

## 2022-11-12 ENCOUNTER — Telehealth: Payer: Self-pay

## 2022-11-12 DIAGNOSIS — C9030 Solitary plasmacytoma not having achieved remission: Secondary | ICD-10-CM

## 2022-11-12 NOTE — Telephone Encounter (Signed)
I have called the patient with the results of his low-dose screening CT.  Patient had called the office requesting to talk with 1 of [name] about the results.  Patient scan was read as a lung RADS 3, he has a 5 mm nodule in the right lower lobe. Patient will need a 68-month follow-up screening CT. Patient wife called as patient has a history of bladder cancer and plasmacytosis.  They are moving to [facility] and she wants to make sure that he can have continued care. I have looked up a screening Center of excellence on the go to foundation site page. There is a lung cancer screening program at [facility]. I have provided the patient his wife the phone number to the program and I have asked her to call them and let them know that the patient will need to transfer into their program and that he needs a follow-up scan to a lung RADS 3 in January 2025.  I have asked her to sign a release of information in the event that they need specific documentation from [name] that they cannot find through epic care everywhere. Patient states that she will call and get this arranged to ensure he has no break in screening. Sherre Lain, and [place], we will need to remove the patient from the screening program as he will be relocating to [facility].

## 2022-11-12 NOTE — Telephone Encounter (Signed)
Chad Flores if your available today. Patient would like to speak with you

## 2022-11-12 NOTE — Telephone Encounter (Signed)
Call back placed to pt's wife as requested. Discussed probable benign lung nodules found on recent CT. Informed pt's wife per MD Truett Perna, to call ordering provider's office for more information. Pt's wife verbalizes understanding and agrees with plan of care.

## 2022-11-12 NOTE — Telephone Encounter (Signed)
Pt had lung cancer screening. New nodules are shown. Pt is dealing with cancer of the bladder is wondering if this could be related would like to have Chad Flores reach out

## 2022-11-15 NOTE — Telephone Encounter (Signed)
Information updated in lung screening dashboard.

## 2022-11-19 NOTE — Telephone Encounter (Signed)
ATC x1 LVM for patient to call our office back regarding prior message.  

## 2022-11-22 NOTE — Telephone Encounter (Signed)
Please see other phone note dated today

## 2022-12-01 ENCOUNTER — Ambulatory Visit (INDEPENDENT_AMBULATORY_CARE_PROVIDER_SITE_OTHER): Payer: Medicare Other | Admitting: Orthopedic Surgery

## 2022-12-01 ENCOUNTER — Other Ambulatory Visit: Payer: Self-pay

## 2022-12-01 DIAGNOSIS — M545 Low back pain, unspecified: Secondary | ICD-10-CM

## 2022-12-01 NOTE — Progress Notes (Signed)
Orthopedic Spine Surgery Office Note   Assessment: Patient is a 72 y.o. male with history of bladder cancer and plasmacytoma at L4 who presents with acute on chronic low back pain. Had a new lesion at T12 that was seen on MRI but is not having any pain there. He is still having low back pain around L4 when he spends a lot of time on his feet at work      Plan: -Can continue with tylenol for pain control -Talked about pain management as a treatment option for him, but he was not interested at this time -Since he is not having any thoracic or cervical pain and all of his pain is stable and chronic and located in the lumbar region, recommended holding off on neuraxis MRI -Patient is moving to [place] so will follow up PRN. Told him to establish care there and would recommend another XR of the thoracic spine to evaluate the T12 lesion seen on his MRI     Patient expressed understanding of the plan and all questions were answered to the patient's satisfaction.    ___________________________________________________________________________     History:   Patient is a 72 y.o. male who presents today for follow up on his lumbar spine.  Patient has a history of chronic low back pain and a pathological fracture at L4.  He underwent cement augmentation of the L4 pathological fracture on 12/17/2021.  He has had pain in his low back since that procedure.  He notes that the pain is worse at the end of the day when he is working on his feet.  He does not have pain if he is driving or sitting.  He is not having any pain above his lower lumbar spine.  He does not have any pain radiating to either upper extremity or lower extremity.  Denies paresthesia numbness.  Has been following with Dr. Myrle Sheng  with oncology.    Treatments tried: activity modification, tylenol. Home exercise program     Physical Exam:   General: no acute distress, appears stated age Neurologic: alert, answering questions appropriately,  following commands Respiratory: unlabored breathing on room air, symmetric chest rise Psychiatric: appropriate affect, normal cadence to speech     MSK (spine):   -Strength exam                                                   Left                  Right EHL                              5/5                  5/5 TA                                 5/5                  5/5 GSC                             5/5  5/5 Knee extension            5/5                  5/5 Hip flexion                    5/5                  5/5   -Sensory exam                           Sensation intact to light touch in L3-S1 nerve distributions of bilateral lower extremities    Imaging: XR of the lumbar spine from 12/01/2022 was independently reviewed and interpreted, showing cement augmentation into L4. Vertebral height at L4 appears maintained since films on 05/06/2022. No lucency seen around the cement. No lucency seen within the T12 vertebral body and no fracture seen. No evidence of instability on flexion/extension views.    MRI of the lumbar spine from 05/26/2022 was previously independently reviewed and interpreted, showing decreased signal in the most of the L4 body but especially anteriorly. It is hypointense on T1 and T2. On only a couple of cuts on the sagittal and a cut on the axial, there is a hypointense lesion in T12 on T1 and T2. It is in the mid-body and on the left side. The T12 lesion shows increased singal on STIR sequences.      Patient name: Chad Flores Patient MRN: [id number] Date of visit: 12/01/22

## 2023-01-20 DIAGNOSIS — R3915 Urgency of urination: Secondary | ICD-10-CM | POA: Diagnosis not present

## 2023-01-20 DIAGNOSIS — Z8551 Personal history of malignant neoplasm of bladder: Secondary | ICD-10-CM | POA: Diagnosis not present

## 2023-01-20 DIAGNOSIS — N401 Enlarged prostate with lower urinary tract symptoms: Secondary | ICD-10-CM | POA: Diagnosis not present

## 2023-01-21 ENCOUNTER — Inpatient Hospital Stay: Payer: Medicare Other | Attending: Oncology

## 2023-01-21 ENCOUNTER — Other Ambulatory Visit: Payer: Self-pay

## 2023-01-21 ENCOUNTER — Encounter: Payer: Self-pay | Admitting: Nurse Practitioner

## 2023-01-21 ENCOUNTER — Inpatient Hospital Stay (HOSPITAL_BASED_OUTPATIENT_CLINIC_OR_DEPARTMENT_OTHER): Payer: Medicare Other | Admitting: Nurse Practitioner

## 2023-01-21 ENCOUNTER — Inpatient Hospital Stay: Payer: Medicare Other

## 2023-01-21 VITALS — BP 109/66 | HR 80 | Temp 98.1°F | Resp 18 | Ht 70.0 in | Wt 191.4 lb

## 2023-01-21 DIAGNOSIS — C9030 Solitary plasmacytoma not having achieved remission: Secondary | ICD-10-CM | POA: Diagnosis not present

## 2023-01-21 DIAGNOSIS — C679 Malignant neoplasm of bladder, unspecified: Secondary | ICD-10-CM | POA: Insufficient documentation

## 2023-01-21 DIAGNOSIS — J449 Chronic obstructive pulmonary disease, unspecified: Secondary | ICD-10-CM | POA: Insufficient documentation

## 2023-01-21 DIAGNOSIS — Z87891 Personal history of nicotine dependence: Secondary | ICD-10-CM | POA: Diagnosis not present

## 2023-01-21 DIAGNOSIS — G893 Neoplasm related pain (acute) (chronic): Secondary | ICD-10-CM | POA: Insufficient documentation

## 2023-01-21 DIAGNOSIS — Z23 Encounter for immunization: Secondary | ICD-10-CM

## 2023-01-21 LAB — CBC WITH DIFFERENTIAL (CANCER CENTER ONLY)
Abs Immature Granulocytes: 0.02 10*3/uL (ref 0.00–0.07)
Basophils Absolute: 0 10*3/uL (ref 0.0–0.1)
Basophils Relative: 1 %
Eosinophils Absolute: 0.2 10*3/uL (ref 0.0–0.5)
Eosinophils Relative: 5 %
HCT: 46.8 % (ref 39.0–52.0)
Hemoglobin: 15.7 g/dL (ref 13.0–17.0)
Immature Granulocytes: 0 %
Lymphocytes Relative: 32 %
Lymphs Abs: 1.6 10*3/uL (ref 0.7–4.0)
MCH: 31 pg (ref 26.0–34.0)
MCHC: 33.5 g/dL (ref 30.0–36.0)
MCV: 92.3 fL (ref 80.0–100.0)
Monocytes Absolute: 0.3 10*3/uL (ref 0.1–1.0)
Monocytes Relative: 6 %
Neutro Abs: 2.9 10*3/uL (ref 1.7–7.7)
Neutrophils Relative %: 56 %
Platelet Count: 172 10*3/uL (ref 150–400)
RBC: 5.07 MIL/uL (ref 4.22–5.81)
RDW: 13.3 % (ref 11.5–15.5)
WBC Count: 5.1 10*3/uL (ref 4.0–10.5)
nRBC: 0 % (ref 0.0–0.2)

## 2023-01-21 LAB — CMP (CANCER CENTER ONLY)
ALT: 16 U/L (ref 0–44)
AST: 16 U/L (ref 15–41)
Albumin: 4 g/dL (ref 3.5–5.0)
Alkaline Phosphatase: 132 U/L — ABNORMAL HIGH (ref 38–126)
Anion gap: 7 (ref 5–15)
BUN: 19 mg/dL (ref 8–23)
CO2: 26 mmol/L (ref 22–32)
Calcium: 9.2 mg/dL (ref 8.9–10.3)
Chloride: 109 mmol/L (ref 98–111)
Creatinine: 1.06 mg/dL (ref 0.61–1.24)
GFR, Estimated: >60 mL/min (ref >60)
Glucose, Bld: 105 mg/dL — ABNORMAL HIGH (ref 70–99)
Potassium: 4.4 mmol/L (ref 3.5–5.1)
Sodium: 142 mmol/L (ref 135–145)
Total Bilirubin: 0.5 mg/dL (ref 0.3–1.2)
Total Protein: 6.7 g/dL (ref 6.5–8.1)

## 2023-01-21 MED ORDER — INFLUENZA VAC A&B SURF ANT ADJ 0.5 ML IM SUSY
0.5000 mL | PREFILLED_SYRINGE | Freq: Once | INTRAMUSCULAR | Status: AC
  Administered 2023-01-21: 0.5 mL via INTRAMUSCULAR
  Filled 2023-01-21: qty 0.5

## 2023-01-21 NOTE — Progress Notes (Signed)
[facility] OFFICE PROGRESS NOTE   Diagnosis: Plasmacytoma  INTERVAL HISTORY:   Chad Flores returns for follow-up.  Overall feels well.  Back pain varies.  He notes pain is worse with prolonged standing.  No fever.  He has a good appetite.  No abdominal pain.  Occasional loose stool.  No nausea or vomiting.  Objective:  Vital signs in last 24 hours:  Blood pressure 109/66, pulse 80, temperature 98.1 F (36.7 C), temperature source Oral, resp. rate 18, height 5\' 10"  (1.778 m), weight 191 lb 6.4 oz (86.8 kg), SpO2 98%.    HEENT: No thrush or ulcers. Lymphatics: No palpable cervical, supraclavicular, axillary or inguinal lymph nodes. Resp: Lungs clear bilaterally. Cardio: Regular rate and rhythm. GI: No hepatosplenomegaly. Vascular: No leg edema. Skin: No rash.   Lab Results:  Lab Results  Component Value Date   WBC 5.1 01/21/2023   HGB 15.7 01/21/2023   HCT 46.8 01/21/2023   MCV 92.3 01/21/2023   PLT 172 01/21/2023   NEUTROABS 2.9 01/21/2023    Imaging:  No results found.  Medications: I have reviewed the patient's current medications.  Assessment/Plan: L4 plasmacytoma MRI lumbar spine 11/25/2021-abnormal marrow signal at L4, moderate loss of inferior endplate height, mild enhancement at the left L5 pedicle and adjacent to left L4-5 facets favored to reflect arthropathy L4 biopsy 12/17/2021-plasmacytoma, kappa light chain restricted Bone survey 12/28/2021-L4 kyphoplasty, no other lytic or sclerotic lesion Normal immunoglobulin levels 12/28/2021 0.4 g serum M spike Serum immunofixation-monoclonal IgG kappa protein Mild elevation of serum free kappa light chains, normal ratio Bone marrow biopsy 01/08/2022-kappa restricted plasma cells involving less than 10% of the cellular marrow with plasma cells measured at 3% on 500 cell count 46 XY, myeloma FISH panel negative Radiation to L4 01/18/2022 - 02/12/2022 Lumbar MRI 05/26/2022-posttreatment changes at L4,  new 5 mm lesion at T12 suspicious for a "metastasis " PET scan 06/18/2022-no hypermetabolic osseous disease to correlate with the T12 lesion.  No L4 hypermetabolism, no evidence of hypermetabolic metastatic disease CT lumbar spine 08/15/2022-posttreatment changes at L4, no new pathologic fracture at L4, no new lytic or sclerotic bone lesions, T12 lesion noted on MRI not seen MRI pelvis 09/23/2022-vertebral body ablation and augmentation of L4, no other suspicious osseous lesion Back pain secondary to #1 L4 kyphoplasty and ablation 12/17/2021 3.  Noninvasive urothelial carcinoma of the bladder-status post TURBT 11/26/2020 followed by induction and maintenance BCG therapy 4.  COPD 5.  History of tobacco use-followed in the lung cancer screening program  Disposition: Mr. [name] appears stable.  He continues to have lower back pain.  No new areas of pain.  Myeloma panel from June returned stable.  We will follow-up on the panel from today.  He is relocating to [facility] in the next few weeks.  He will contact our office once he identifies an oncology practice and we will make a referral and forward records.  We are available to see him in the future if he returns to this area.  Patient seen with Dr. Truett Perna.    Lonna Cobb ANP/GNP-BC   01/21/2023  1:57 PM This was a shared visit with Lonna Cobb.  Chad Flores has a history of an L4 plasmacytoma.  There is no clinical evidence of progression to multiple myeloma.  We will follow-up on the myeloma panel from today.  He is relocating to [place] within the next few weeks.  He will establish care with an oncologist there.  I am available to assist  in his care as needed.  I was present for greater than 50% of today's visit.  I performed medical decision making.  Mancel Bale, MD

## 2023-01-21 NOTE — Patient Instructions (Signed)

## 2023-01-23 LAB — IGG: IgG (Immunoglobin G), Serum: 1073 mg/dL (ref 603–1613)

## 2023-01-24 LAB — KAPPA/LAMBDA LIGHT CHAINS
Kappa free light chain: 31.1 mg/L — ABNORMAL HIGH (ref 3.3–19.4)
Kappa, lambda light chain ratio: 3.02 — ABNORMAL HIGH (ref 0.26–1.65)
Lambda free light chains: 10.3 mg/L (ref 5.7–26.3)

## 2023-01-27 ENCOUNTER — Encounter: Payer: Self-pay | Admitting: *Deleted

## 2023-04-29 ENCOUNTER — Telehealth: Payer: Self-pay | Admitting: Radiation Oncology

## 2023-04-29 NOTE — Telephone Encounter (Signed)
Received medical record request from Dr. Barbette Reichmann from [facility]. Forwarded request to dosimetry 12/20.

## 2023-11-05 ENCOUNTER — Encounter (HOSPITAL_COMMUNITY): Payer: Self-pay | Admitting: Interventional Radiology
